# Patient Record
Sex: Female | Born: 1940
Health system: Southern US, Community
[De-identification: ages and names within clinical notes are randomized; demographics above are authoritative.]

## PROBLEM LIST (undated history)

## (undated) DIAGNOSIS — Z6833 Body mass index (BMI) 33.0-33.9, adult: Secondary | ICD-10-CM

## (undated) DIAGNOSIS — I10 Essential (primary) hypertension: Secondary | ICD-10-CM

## (undated) DIAGNOSIS — R29810 Facial weakness: Secondary | ICD-10-CM

## (undated) DIAGNOSIS — M159 Polyosteoarthritis, unspecified: Secondary | ICD-10-CM

## (undated) DIAGNOSIS — K5790 Diverticulosis of intestine, part unspecified, without perforation or abscess without bleeding: Secondary | ICD-10-CM

## (undated) DIAGNOSIS — M4722 Other spondylosis with radiculopathy, cervical region: Secondary | ICD-10-CM

## (undated) DIAGNOSIS — M1711 Unilateral primary osteoarthritis, right knee: Secondary | ICD-10-CM

## (undated) HISTORY — DX: Body mass index (BMI) 33.0-33.9, adult: Z68.33

## (undated) HISTORY — DX: Polyosteoarthritis, unspecified: M15.9

## (undated) HISTORY — DX: Other spondylosis with radiculopathy, cervical region: M47.22

## (undated) HISTORY — DX: Unilateral primary osteoarthritis, right knee: M17.11

## (undated) HISTORY — DX: Diverticulosis of intestine, part unspecified, without perforation or abscess without bleeding: K57.90

## (undated) NOTE — *Deleted (*Deleted)
Subjective:   ***  Objective:  Vital signs in last 24 hours: Vitals:   06/20/20 1553 06/20/20 2052 06/20/20 2313 06/21/20 0326  BP: (!) 144/72 (!) 152/74 (!) 165/74 (!) 160/78  Pulse: 84 91 94 89  Resp: 20 19 17 18   Temp: 98.3 F (36.8 C) 98.4 F (36.9 C) 98 F (36.7 C) 98.2 F (36.8 C)  TempSrc: Oral Oral  Oral  SpO2: 95% 96%  96%  Weight:      Height:         moderate abdominal distention, although abdomen is soft and non-tender to  a reducible, non-tender, non-erythematous umbilical hernia.   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 Neurological: Facial droop is present along areas innervated by V1-V3 on the left. Strength is 5/5 in all four extremities. There is myoclonus of the lower extremities and left upper extremity. No significant asterixes is present. There is decreased sensation along the right lower extremity. Sensation intact in other extremities. Patient is alert and answers questions appropriately, although she appears restless and slightly agitated.  Skin: There are scabbed vesicles on an erythematous base extending across the lateral left breast into the left axillary region of the left upper extremity. No other evidence of lesions or masses.  Psych: Patient appears restless and slightly agitated / anxious.   Assessment/Plan:  Principal Problem:   Encephalopathy Active Problems:   Hyponatremia  # Acute Encephalopathy Patient developed seizure-like symptoms acutely the morning of 06/19/20 following severe headache and abdominal pain, in the setting of acute shingles infection. Husband endorses decreased appetite x 6 days with nausea, vomiting, chills, and decreased PO intake. Differential is wide and includes seizure/post-ictal state, VZV encephalitis, other infectious cause, delirium in setting of possible UTI vs. Pyelonephritis, diverticulitis, hyponatremia-induced vs. medication induced (on high dose gabapentin at home). Husband states that at baseline  she is oriented and independent in ADL's. CT head without contrast showed small vessel disease and an equivocal asymmetric increased density of the Right MCA, but no acute cortically based infarct or acute intracranial hemorrhage. MRI brain without contrast showed chronic lacunar infarcts vs. perivascular spaces in the bilateral BG and chronic lacunar infarct of the right caudate with tiny infarcts of bilateral cerebellar hemispheres, but no acute intracranial abnormality. EEG showed cortical dysfunction of the R temporal region and moderate diffuse encephalopathy possibly consistent with post-ictal state; however, no seizures or epileptiform discharge were noted. 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 Patient is on HCTZ 25mg  and Losartan 100mg  daily at home. Patient appears hypervolemic with 1+ bilateral pitting edema and JVD. Creatinine and BUN uptrending. Likely renal component, including SIADH and/or RAAS abnormality / medication effect. Also consider cirrhosis / CHF. No ECHO on file although CT abdomen/pelvis showed possible cirrhosis, mild hepatic steatosis.  - Patient passed bedside swallow evaluation  - Will start on carb-modified diet with fluid restriction, daily  - Will avoid IVF  - Monitor BMP q6 hours for sodium correction, goal < 10 point correction per 24 hours   # Active Varicella Zoster Infection Patient's husband states patient was diagnosed  with shingles in May of this year, which resolved, but  developed recurrence along her left breast/axillary region x 3 weeks. Patient had been in severe pain but had stopped taking her home Valtrex with 4 remaining doses prior to admission. May be contributing factor to encephalitis. - Patient started on empirical Acyclovir until LP able to be obtained - Patient is s/p many unsuccessful LP attempts due to agitation / movement  - Monitor for improvement in rash and mental status   # Likely Pyelonephritis  U/A showed cloudy urine with large leukocytes, negative nitrites, small hemoglobinuria, no bacteria, >50 WBC, 11-20 RBC and mucus. Triple phosphate crystals present. CT abdomen/pelvis without contrast showed perinephric stranding but no nephrolithiasis. Bladder distended with air-fluid level. Patient afebrile, initial WBC 18.1. Denies associated symptoms at this time.  - Urine culture and sensitivities pending  - Started empiric treatment with Augmentin 875mg  q12 hours x 10-14 days  - Will check bladder scan q4 hours with I&O cath if >300cc's    # Diverticulosis with possible acute Diverticulitis  Patient has had nausea, vomiting, decreased PO intake, and intermittent severe abdominal pain per patient's husband with benign physical exam aside from moderate abdominal distention. Patient afebrile. CT abdomen pelvis showed colonic diverticulosis with mild pericolonic fat stranding involving the junction of the descending and sigmoid colon in the region of multiple diverticula, suspicious for acute diverticulitis without perforation or abscess.  - Started on Augmentin 875mg  q12 hrs  - Continue to monitor daily CBC, vitals   # Hepatomegaly with Borderline Hepatic Steatosis and Possible Cirrhosis  Husband notes no alcohol history, ETOH < 10, urine tox negative. HIV negative, no known history of cirrhosis. Likely due to NASH given HTN, pre-diabetes, patient is overweight. Albumin 3.1, no thrombocytopenia. - Will check HCV Antibodies  - Will check PT/INR   #  Mild Splenomegaly Platelets 271 without thrombocytopenia. WBC 18.1 with neutrophil predominance, though monocyte count 3.1. Unclear etiology. - No follow up at this time   # History of HTN Patient takes HCTZ 25mg , Losartan 100mg , and Metoprolol succinate 100mg  daily. It is unclear if patient has been taking her amlodipine 5mg  daily at home. Initial pressure 166/79, although significantly improved without home medications in ED. Vitals today mildly elevated.  - Will hold home antihypertensives for now although will likely start on amlodipine if pressures elevated tomorrow  - Patient may benefit outpatient from lisinopril if microalbuminuria continues   # Pre-Diabetes Hgb A1c 6.1 this admission. Patient not on any glucose lowering agents with no known hx DM.  - Sugars may be elevated in recent inflammation  - Continue to monitor daily BG    Encephalopathy may be due to varicella infection vs. Sedation on gabapentin at home, 2/2 hyperammonemia in setting of abdominal ascites, hypothyroidism vs. Other toxic-metabolic derangement, anoxic/hypoxic brain injury. Less likely due to hyponatremia. ETOH negative.   Prior to Admission Living Arrangement: Home Anticipated Discharge Location: Home vs. SNF Barriers to Discharge: Acute encephalopathy, possible infectious etiologies   Chevis Pretty, MD 06/21/2020, 6:13 AM Pager: [phone] After 5pm on weekdays and 1pm on weekends: On Call pager [phone]

---

## 1997-01-24 HISTORY — PX: CHOLECYSTECTOMY: SHX55

## 1997-02-24 HISTORY — PX: OTHER SURGICAL HISTORY: SHX169

## 2001-09-26 HISTORY — PX: CATARACT EXTRACTION: SUR2

## 2016-06-09 DIAGNOSIS — Z4889 Encounter for other specified surgical aftercare: Secondary | ICD-10-CM

## 2016-06-09 HISTORY — DX: Encounter for other specified surgical aftercare: Z48.89

## 2019-09-27 DIAGNOSIS — I358 Other nonrheumatic aortic valve disorders: Secondary | ICD-10-CM

## 2019-09-27 HISTORY — DX: Other nonrheumatic aortic valve disorders: I35.8

## 2019-11-25 DIAGNOSIS — G518 Other disorders of facial nerve: Secondary | ICD-10-CM | POA: Diagnosis not present

## 2019-12-24 DIAGNOSIS — D0439 Carcinoma in situ of skin of other parts of face: Secondary | ICD-10-CM | POA: Diagnosis not present

## 2019-12-24 DIAGNOSIS — L570 Actinic keratosis: Secondary | ICD-10-CM | POA: Diagnosis not present

## 2019-12-24 DIAGNOSIS — L814 Other melanin hyperpigmentation: Secondary | ICD-10-CM | POA: Diagnosis not present

## 2019-12-24 DIAGNOSIS — L821 Other seborrheic keratosis: Secondary | ICD-10-CM | POA: Diagnosis not present

## 2019-12-30 DIAGNOSIS — M25512 Pain in left shoulder: Secondary | ICD-10-CM | POA: Diagnosis not present

## 2019-12-30 DIAGNOSIS — S4992XA Unspecified injury of left shoulder and upper arm, initial encounter: Secondary | ICD-10-CM | POA: Diagnosis not present

## 2019-12-30 DIAGNOSIS — R0789 Other chest pain: Secondary | ICD-10-CM | POA: Diagnosis not present

## 2019-12-30 DIAGNOSIS — S299XXA Unspecified injury of thorax, initial encounter: Secondary | ICD-10-CM | POA: Diagnosis not present

## 2020-01-06 DIAGNOSIS — R21 Rash and other nonspecific skin eruption: Secondary | ICD-10-CM | POA: Diagnosis not present

## 2020-01-06 DIAGNOSIS — R079 Chest pain, unspecified: Secondary | ICD-10-CM | POA: Diagnosis not present

## 2020-01-06 DIAGNOSIS — R0789 Other chest pain: Secondary | ICD-10-CM | POA: Diagnosis not present

## 2020-02-06 DIAGNOSIS — B029 Zoster without complications: Secondary | ICD-10-CM | POA: Diagnosis not present

## 2020-02-13 DIAGNOSIS — I10 Essential (primary) hypertension: Secondary | ICD-10-CM | POA: Diagnosis not present

## 2020-02-13 DIAGNOSIS — B029 Zoster without complications: Secondary | ICD-10-CM | POA: Diagnosis not present

## 2020-02-27 DIAGNOSIS — I10 Essential (primary) hypertension: Secondary | ICD-10-CM | POA: Diagnosis not present

## 2020-03-09 DIAGNOSIS — G518 Other disorders of facial nerve: Secondary | ICD-10-CM | POA: Diagnosis not present

## 2020-03-09 DIAGNOSIS — I889 Nonspecific lymphadenitis, unspecified: Secondary | ICD-10-CM | POA: Diagnosis not present

## 2020-04-24 DIAGNOSIS — G5600 Carpal tunnel syndrome, unspecified upper limb: Secondary | ICD-10-CM | POA: Diagnosis not present

## 2020-04-24 DIAGNOSIS — G5601 Carpal tunnel syndrome, right upper limb: Secondary | ICD-10-CM | POA: Diagnosis not present

## 2020-04-28 DIAGNOSIS — R600 Localized edema: Secondary | ICD-10-CM | POA: Diagnosis not present

## 2020-04-28 DIAGNOSIS — E785 Hyperlipidemia, unspecified: Secondary | ICD-10-CM | POA: Diagnosis not present

## 2020-04-28 DIAGNOSIS — I10 Essential (primary) hypertension: Secondary | ICD-10-CM | POA: Diagnosis not present

## 2020-05-05 DIAGNOSIS — M7989 Other specified soft tissue disorders: Secondary | ICD-10-CM | POA: Diagnosis not present

## 2020-06-11 DIAGNOSIS — B029 Zoster without complications: Secondary | ICD-10-CM | POA: Diagnosis not present

## 2020-06-19 ENCOUNTER — Emergency Department (HOSPITAL_COMMUNITY): Payer: Medicare PPO

## 2020-06-19 ENCOUNTER — Inpatient Hospital Stay (HOSPITAL_COMMUNITY): Payer: Medicare PPO

## 2020-06-19 ENCOUNTER — Inpatient Hospital Stay (HOSPITAL_COMMUNITY)
Admission: EM | Admit: 2020-06-19 | Discharge: 2020-07-01 | DRG: 073 | Disposition: A | Discharge status: Home Health | Payer: Medicare PPO | Attending: Internal Medicine | Admitting: Internal Medicine

## 2020-06-19 ENCOUNTER — Encounter (HOSPITAL_COMMUNITY): Payer: Self-pay | Admitting: Emergency Medicine

## 2020-06-19 DIAGNOSIS — G928 Other toxic encephalopathy: Secondary | ICD-10-CM | POA: Diagnosis present

## 2020-06-19 DIAGNOSIS — Z91041 Radiographic dye allergy status: Secondary | ICD-10-CM

## 2020-06-19 DIAGNOSIS — R29818 Other symptoms and signs involving the nervous system: Secondary | ICD-10-CM | POA: Diagnosis not present

## 2020-06-19 DIAGNOSIS — G4489 Other headache syndrome: Secondary | ICD-10-CM | POA: Diagnosis not present

## 2020-06-19 DIAGNOSIS — E878 Other disorders of electrolyte and fluid balance, not elsewhere classified: Secondary | ICD-10-CM | POA: Diagnosis present

## 2020-06-19 DIAGNOSIS — R270 Ataxia, unspecified: Secondary | ICD-10-CM | POA: Diagnosis present

## 2020-06-19 DIAGNOSIS — I480 Paroxysmal atrial fibrillation: Secondary | ICD-10-CM | POA: Diagnosis not present

## 2020-06-19 DIAGNOSIS — G8194 Hemiplegia, unspecified affecting left nondominant side: Secondary | ICD-10-CM | POA: Diagnosis present

## 2020-06-19 DIAGNOSIS — J90 Pleural effusion, not elsewhere classified: Secondary | ICD-10-CM | POA: Diagnosis not present

## 2020-06-19 DIAGNOSIS — B020 Zoster encephalitis: Secondary | ICD-10-CM | POA: Diagnosis not present

## 2020-06-19 DIAGNOSIS — R160 Hepatomegaly, not elsewhere classified: Secondary | ICD-10-CM | POA: Diagnosis not present

## 2020-06-19 DIAGNOSIS — E663 Overweight: Secondary | ICD-10-CM | POA: Diagnosis present

## 2020-06-19 DIAGNOSIS — I313 Pericardial effusion (noninflammatory): Secondary | ICD-10-CM | POA: Diagnosis present

## 2020-06-19 DIAGNOSIS — N289 Disorder of kidney and ureter, unspecified: Secondary | ICD-10-CM | POA: Diagnosis not present

## 2020-06-19 DIAGNOSIS — Z882 Allergy status to sulfonamides status: Secondary | ICD-10-CM

## 2020-06-19 DIAGNOSIS — I5031 Acute diastolic (congestive) heart failure: Secondary | ICD-10-CM | POA: Diagnosis present

## 2020-06-19 DIAGNOSIS — Z6829 Body mass index (BMI) 29.0-29.9, adult: Secondary | ICD-10-CM

## 2020-06-19 DIAGNOSIS — Z23 Encounter for immunization: Secondary | ICD-10-CM | POA: Diagnosis not present

## 2020-06-19 DIAGNOSIS — I309 Acute pericarditis, unspecified: Secondary | ICD-10-CM | POA: Diagnosis not present

## 2020-06-19 DIAGNOSIS — R161 Splenomegaly, not elsewhere classified: Secondary | ICD-10-CM | POA: Diagnosis present

## 2020-06-19 DIAGNOSIS — K429 Umbilical hernia without obstruction or gangrene: Secondary | ICD-10-CM | POA: Diagnosis not present

## 2020-06-19 DIAGNOSIS — N10 Acute tubulo-interstitial nephritis: Secondary | ICD-10-CM | POA: Diagnosis not present

## 2020-06-19 DIAGNOSIS — R404 Transient alteration of awareness: Secondary | ICD-10-CM | POA: Diagnosis not present

## 2020-06-19 DIAGNOSIS — B028 Zoster with other complications: Secondary | ICD-10-CM | POA: Diagnosis not present

## 2020-06-19 DIAGNOSIS — R339 Retention of urine, unspecified: Secondary | ICD-10-CM | POA: Diagnosis not present

## 2020-06-19 DIAGNOSIS — Z8673 Personal history of transient ischemic attack (TIA), and cerebral infarction without residual deficits: Secondary | ICD-10-CM

## 2020-06-19 DIAGNOSIS — N179 Acute kidney failure, unspecified: Secondary | ICD-10-CM | POA: Diagnosis not present

## 2020-06-19 DIAGNOSIS — R079 Chest pain, unspecified: Secondary | ICD-10-CM | POA: Diagnosis not present

## 2020-06-19 DIAGNOSIS — G0490 Encephalitis and encephalomyelitis, unspecified: Secondary | ICD-10-CM | POA: Diagnosis not present

## 2020-06-19 DIAGNOSIS — R531 Weakness: Secondary | ICD-10-CM | POA: Diagnosis not present

## 2020-06-19 DIAGNOSIS — T375X6A Underdosing of antiviral drugs, initial encounter: Secondary | ICD-10-CM | POA: Diagnosis present

## 2020-06-19 DIAGNOSIS — K7460 Unspecified cirrhosis of liver: Secondary | ICD-10-CM | POA: Diagnosis not present

## 2020-06-19 DIAGNOSIS — E7220 Disorder of urea cycle metabolism, unspecified: Secondary | ICD-10-CM | POA: Diagnosis not present

## 2020-06-19 DIAGNOSIS — K5730 Diverticulosis of large intestine without perforation or abscess without bleeding: Secondary | ICD-10-CM | POA: Diagnosis present

## 2020-06-19 DIAGNOSIS — I4891 Unspecified atrial fibrillation: Secondary | ICD-10-CM | POA: Diagnosis not present

## 2020-06-19 DIAGNOSIS — N390 Urinary tract infection, site not specified: Secondary | ICD-10-CM | POA: Diagnosis present

## 2020-06-19 DIAGNOSIS — B964 Proteus (mirabilis) (morganii) as the cause of diseases classified elsewhere: Secondary | ICD-10-CM | POA: Diagnosis not present

## 2020-06-19 DIAGNOSIS — Z20822 Contact with and (suspected) exposure to covid-19: Secondary | ICD-10-CM | POA: Diagnosis not present

## 2020-06-19 DIAGNOSIS — B961 Klebsiella pneumoniae [K. pneumoniae] as the cause of diseases classified elsewhere: Secondary | ICD-10-CM | POA: Diagnosis not present

## 2020-06-19 DIAGNOSIS — B029 Zoster without complications: Secondary | ICD-10-CM

## 2020-06-19 DIAGNOSIS — G9340 Encephalopathy, unspecified: Secondary | ICD-10-CM | POA: Diagnosis not present

## 2020-06-19 DIAGNOSIS — G92 Toxic encephalopathy: Secondary | ICD-10-CM | POA: Diagnosis not present

## 2020-06-19 DIAGNOSIS — Z885 Allergy status to narcotic agent status: Secondary | ICD-10-CM

## 2020-06-19 DIAGNOSIS — I361 Nonrheumatic tricuspid (valve) insufficiency: Secondary | ICD-10-CM | POA: Diagnosis not present

## 2020-06-19 DIAGNOSIS — I110 Hypertensive heart disease with heart failure: Secondary | ICD-10-CM | POA: Diagnosis not present

## 2020-06-19 DIAGNOSIS — E871 Hypo-osmolality and hyponatremia: Secondary | ICD-10-CM | POA: Diagnosis not present

## 2020-06-19 DIAGNOSIS — I739 Peripheral vascular disease, unspecified: Secondary | ICD-10-CM | POA: Diagnosis present

## 2020-06-19 DIAGNOSIS — Z79899 Other long term (current) drug therapy: Secondary | ICD-10-CM

## 2020-06-19 DIAGNOSIS — G971 Other reaction to spinal and lumbar puncture: Secondary | ICD-10-CM | POA: Diagnosis not present

## 2020-06-19 DIAGNOSIS — R569 Unspecified convulsions: Secondary | ICD-10-CM | POA: Diagnosis present

## 2020-06-19 DIAGNOSIS — R4182 Altered mental status, unspecified: Secondary | ICD-10-CM | POA: Diagnosis not present

## 2020-06-19 DIAGNOSIS — R410 Disorientation, unspecified: Secondary | ICD-10-CM | POA: Diagnosis not present

## 2020-06-19 DIAGNOSIS — G510 Bell's palsy: Secondary | ICD-10-CM | POA: Diagnosis present

## 2020-06-19 DIAGNOSIS — I301 Infective pericarditis: Secondary | ICD-10-CM | POA: Diagnosis not present

## 2020-06-19 DIAGNOSIS — R7303 Prediabetes: Secondary | ICD-10-CM | POA: Diagnosis present

## 2020-06-19 DIAGNOSIS — I440 Atrioventricular block, first degree: Secondary | ICD-10-CM | POA: Diagnosis present

## 2020-06-19 DIAGNOSIS — B3323 Viral pericarditis: Secondary | ICD-10-CM | POA: Diagnosis not present

## 2020-06-19 DIAGNOSIS — I10 Essential (primary) hypertension: Secondary | ICD-10-CM | POA: Diagnosis not present

## 2020-06-19 DIAGNOSIS — Z888 Allergy status to other drugs, medicaments and biological substances status: Secondary | ICD-10-CM

## 2020-06-19 DIAGNOSIS — R52 Pain, unspecified: Secondary | ICD-10-CM | POA: Diagnosis not present

## 2020-06-19 DIAGNOSIS — K760 Fatty (change of) liver, not elsewhere classified: Secondary | ICD-10-CM | POA: Diagnosis not present

## 2020-06-19 DIAGNOSIS — K449 Diaphragmatic hernia without obstruction or gangrene: Secondary | ICD-10-CM | POA: Diagnosis not present

## 2020-06-19 DIAGNOSIS — R29810 Facial weakness: Secondary | ICD-10-CM | POA: Diagnosis not present

## 2020-06-19 DIAGNOSIS — N39490 Overflow incontinence: Secondary | ICD-10-CM | POA: Diagnosis present

## 2020-06-19 DIAGNOSIS — B9789 Other viral agents as the cause of diseases classified elsewhere: Secondary | ICD-10-CM | POA: Diagnosis not present

## 2020-06-19 DIAGNOSIS — I249 Acute ischemic heart disease, unspecified: Secondary | ICD-10-CM

## 2020-06-19 DIAGNOSIS — J9811 Atelectasis: Secondary | ICD-10-CM | POA: Diagnosis not present

## 2020-06-19 HISTORY — DX: Encephalopathy, unspecified: G93.40

## 2020-06-19 HISTORY — DX: Facial weakness: R29.810

## 2020-06-19 HISTORY — DX: Essential (primary) hypertension: I10

## 2020-06-19 LAB — RESPIRATORY PANEL BY RT PCR (FLU A&B, COVID)
Influenza A by PCR: NEGATIVE
Influenza B by PCR: NEGATIVE
SARS Coronavirus 2 by RT PCR: NEGATIVE

## 2020-06-19 LAB — BASIC METABOLIC PANEL
Anion gap: 10 (ref 5–15)
BUN: 17 mg/dL (ref 8–23)
CO2: 27 mmol/L (ref 22–32)
Calcium: 8.3 mg/dL — ABNORMAL LOW (ref 8.9–10.3)
Chloride: 86 mmol/L — ABNORMAL LOW (ref 98–111)
Creatinine, Ser: 0.95 mg/dL (ref 0.44–1.00)
GFR calc Af Amer: >60 mL/min (ref >60)
GFR calc non Af Amer: 56 mL/min — ABNORMAL LOW (ref >60)
Glucose, Bld: 110 mg/dL — ABNORMAL HIGH (ref 70–99)
Potassium: 3.8 mmol/L (ref 3.5–5.1)
Sodium: 123 mmol/L — ABNORMAL LOW (ref 135–145)

## 2020-06-19 LAB — COMPREHENSIVE METABOLIC PANEL
ALT: 18 U/L (ref 0–44)
AST: 18 U/L (ref 15–41)
Albumin: 3.1 g/dL — ABNORMAL LOW (ref 3.5–5.0)
Alkaline Phosphatase: 84 U/L (ref 38–126)
Anion gap: 11 (ref 5–15)
BUN: 14 mg/dL (ref 8–23)
CO2: 26 mmol/L (ref 22–32)
Calcium: 8.9 mg/dL (ref 8.9–10.3)
Chloride: 86 mmol/L — ABNORMAL LOW (ref 98–111)
Creatinine, Ser: 0.72 mg/dL (ref 0.44–1.00)
GFR calc Af Amer: >60 mL/min (ref >60)
GFR calc non Af Amer: >60 mL/min (ref >60)
Glucose, Bld: 125 mg/dL — ABNORMAL HIGH (ref 70–99)
Potassium: 4.1 mmol/L (ref 3.5–5.1)
Sodium: 123 mmol/L — ABNORMAL LOW (ref 135–145)
Total Bilirubin: 0.7 mg/dL (ref 0.3–1.2)
Total Protein: 6.4 g/dL — ABNORMAL LOW (ref 6.5–8.1)

## 2020-06-19 LAB — CBC
HCT: 40 % (ref 36.0–46.0)
Hemoglobin: 13.3 g/dL (ref 12.0–15.0)
MCH: 27 pg (ref 26.0–34.0)
MCHC: 33.3 g/dL (ref 30.0–36.0)
MCV: 81.3 fL (ref 80.0–100.0)
Platelets: 271 10*3/uL (ref 150–400)
RBC: 4.92 MIL/uL (ref 3.87–5.11)
RDW: 13.4 % (ref 11.5–15.5)
WBC: 18.1 10*3/uL — ABNORMAL HIGH (ref 4.0–10.5)
nRBC: 0 % (ref 0.0–0.2)

## 2020-06-19 LAB — DIFFERENTIAL
Abs Immature Granulocytes: 1.01 10*3/uL — ABNORMAL HIGH (ref 0.00–0.07)
Basophils Absolute: 0.1 10*3/uL (ref 0.0–0.1)
Basophils Relative: 0 %
Eosinophils Absolute: 0.1 10*3/uL (ref 0.0–0.5)
Eosinophils Relative: 0 %
Immature Granulocytes: 6 %
Lymphocytes Relative: 11 %
Lymphs Abs: 2.1 10*3/uL (ref 0.7–4.0)
Monocytes Absolute: 3.1 10*3/uL — ABNORMAL HIGH (ref 0.1–1.0)
Monocytes Relative: 17 %
Neutro Abs: 11.9 10*3/uL — ABNORMAL HIGH (ref 1.7–7.7)
Neutrophils Relative %: 66 %

## 2020-06-19 LAB — PROTIME-INR
INR: 1.1 (ref 0.8–1.2)
Prothrombin Time: 13.5 seconds (ref 11.4–15.2)

## 2020-06-19 LAB — HIV ANTIBODY (ROUTINE TESTING W REFLEX): HIV Screen 4th Generation wRfx: NONREACTIVE

## 2020-06-19 LAB — AMMONIA: Ammonia: 19 umol/L (ref 9–35)

## 2020-06-19 LAB — APTT: aPTT: 27 seconds (ref 24–36)

## 2020-06-19 LAB — ETHANOL: Alcohol, Ethyl (B): <10 mg/dL (ref <10)

## 2020-06-19 MED ORDER — ONDANSETRON HCL 4 MG PO TABS: 4.0000 mg | ORAL_TABLET | Freq: Four times a day (QID) | ORAL | Status: DC | PRN

## 2020-06-19 MED ORDER — ACETAMINOPHEN 325 MG PO TABS
650.0000 mg | ORAL_TABLET | Freq: Four times a day (QID) | ORAL | Status: DC | PRN
  Administered 2020-06-28 – 2020-06-29 (×2): 650 mg via ORAL
  Filled 2020-06-19 (×2): qty 2

## 2020-06-19 MED ORDER — LEVETIRACETAM 500 MG PO TABS
500.0000 mg | ORAL_TABLET | Freq: Two times a day (BID) | ORAL | Status: DC
  Filled 2020-06-19: qty 1

## 2020-06-19 MED ORDER — ACETAMINOPHEN 650 MG RE SUPP: 650.0000 mg | Freq: Four times a day (QID) | RECTAL | Status: DC | PRN

## 2020-06-19 MED ORDER — DEXTROSE 5 % IV SOLN
800.0000 mg | Freq: Three times a day (TID) | INTRAVENOUS | Status: DC
  Administered 2020-06-19 – 2020-06-20 (×2): 800 mg via INTRAVENOUS
  Filled 2020-06-19 (×9): qty 16

## 2020-06-19 MED ORDER — ONDANSETRON HCL 4 MG/2ML IJ SOLN: 4.0000 mg | Freq: Four times a day (QID) | INTRAMUSCULAR | Status: DC | PRN

## 2020-06-19 MED ORDER — LORAZEPAM 2 MG/ML IJ SOLN
1.0000 mg | Freq: Once | INTRAMUSCULAR | Status: AC
  Administered 2020-06-19: 1 mg via INTRAVENOUS
  Filled 2020-06-19: qty 1

## 2020-06-19 MED ORDER — LEVETIRACETAM IN NACL 1500 MG/100ML IV SOLN
1500.0000 mg | Freq: Once | INTRAVENOUS | Status: AC
  Administered 2020-06-19: 1500 mg via INTRAVENOUS
  Filled 2020-06-19: qty 100

## 2020-06-19 MED ORDER — LEVETIRACETAM IN NACL 500 MG/100ML IV SOLN
500.0000 mg | Freq: Two times a day (BID) | INTRAVENOUS | Status: DC
  Administered 2020-06-19 – 2020-07-01 (×24): 500 mg via INTRAVENOUS
  Filled 2020-06-19 (×25): qty 100

## 2020-06-19 MED ORDER — GADOBUTROL 1 MMOL/ML IV SOLN
6.0000 mL | Freq: Once | INTRAVENOUS | Status: AC | PRN
  Administered 2020-06-19: 6 mL via INTRAVENOUS

## 2020-06-19 MED ORDER — LIDOCAINE HCL (PF) 1 % IJ SOLN: 5.0000 mL | Freq: Once | INTRAMUSCULAR | Status: DC

## 2020-06-19 MED ORDER — ENOXAPARIN SODIUM 40 MG/0.4ML ~~LOC~~ SOLN
40.0000 mg | SUBCUTANEOUS | Status: DC
  Administered 2020-06-19 – 2020-06-20 (×2): 40 mg via SUBCUTANEOUS
  Filled 2020-06-19 (×2): qty 0.4

## 2020-06-19 MED ORDER — SODIUM CHLORIDE 0.9 % IV SOLN
INTRAVENOUS | Status: DC | PRN
  Administered 2020-06-19 – 2020-06-24 (×4): 1000 mL via INTRAVENOUS
  Administered 2020-06-25 – 2020-06-26 (×2): 250 mL via INTRAVENOUS
  Administered 2020-06-28: 500 mL via INTRAVENOUS
  Administered 2020-06-29: 250 mL via INTRAVENOUS

## 2020-06-19 NOTE — Hospital Course (Addendum)
Alyssa Hudson is an 79 year old woman with a past medical history of recent shingles and carpal tunnel syndrome who was admitted to the hospital on 06/19/2020 due to altered mental status secondary to viral encephalitis and pyelonephritis.  Acute Encephalopathy  Patient developed seizure-like symptoms acutely the morning of 06/19/20 following severe headache and abdominal pain, in the setting of acute shingles infection. CT head without contrast showed small vessel disease and an equivocal asymmetric increased density of the Right MCA, without acute disease process. MRI brain without contrast showed chronic lacunar infarcts vs. perivascular spaces in the bilateral BG and chronic lacunar infarct of the right caudate with tiny infarcts of bilateral cerebellar hemispheres, but no acute intracranial abnormality. EEG showed cortical dysfunction of the R temporal region and moderate diffuse encephalopathy possibly consistent with post-ictal state. Differential included VZV encephalitis versus metabolic encephalopathy 2/2 urinary infection. She was treated empirically with acyclovir for 12 days (10 days IV acyclovir + 2 days PO valacyclovir) to cover VZV, doxycycline for 7 days for to cover for possible tickborne illness. Her mentation continued to improve throughout her hospital stay and she returned to her baseline mentation by hospital day 9.  Active Varicella Zoster Infection Patient was diagnosed with shingles in May 2021, which resolved, but developed recurrence along her left breast/axillary region 3 weeks prior to admission. Patient had been in severe pain but had stopped taking her home Valtrex with 4 remaining doses prior to admission. She completed a 12-day course of acyclovir/valacyclovir during hospitalization. - Follow-up with PCP for consideration of prophylactic Valtrex   Atrial fibrillation in the setting of pericarditis Patient without history of arrhythmia. Toward the end of her hospital stay  she started showing bigeminy, PVCs, and going in and out of atrial fibrillation, despite being overall asymptomatic. One episode of atrial fibrillation was captured on EKG, subsequent echo showed normal EF with a small pericardial effusion. She was started on colchicine 0.6 mg twice daily and ibuprofen 600 mg 3 times daily. She will continue the colchicine for 6 months and the ibuprofen for 2 weeks. She is rate controlled with Toprol-XL 100 mg daily. Cardiology decided to hold off on anticoagulation due to the atrial fibrillation being in the setting of acute pericarditis. She will follow up with cardiology outpatient for management of this. - Colchicine 0.6 mg twice daily for 6 months (end ~April 2022) - Ibuprofen 600 mg 3 times daily for 2 weeks (end date: ~07/14/20)  Hypomagnesemia Patient was found to have asymptomatic hypomagnesemia with levels trending down to 0.9 and leveling off around 1.7. Her magnesium was repleted multiple times while in the hospital. She will follow up on this with her PCP. - Repeat Mg at hospital follow-up  Urinary retention While in the hospital she described a prior history consistent with a mixed picture of urge and overflow incontinence. While in the hospital she developed overflow incontinence requiring I/O cath multiple times per day. Foley catheter was removed on day of discharge and she was able to urinate without problem.   UTI with possible pyelonephritis On admission she had a urinalysis and CT abdomen/pelvis just of pyelonephritis. Due to initial altered mental status, symptoms were unable to be elicited. Her urine culture grew Klebsiella Pneumoniae and Proteus Vulgaris, and she was treated with 7 days of appropriate antibiotics.   Hypotonic, Hypochloremic Hyponatremia  AKI Initial sodium 123, chloride 86, Serum osms 259, Urine osms and urine sodium inappropriately elevated at 505 and 75, respectively. Patient is on HCTZ 25mg  and Losartan 100mg   daily at home.  This was likely due to a combined picture of SIADH and home medications HCTZ and Losartan. Patient also developed an AKI 3 days into starting acyclovir, due to the need for continuing acyclovir for her encephalitis, she was started on LR for maintenance fluids. Her AKI subsequently resolved, BUN/creatinine normalized on discharge.  - Follow-up BMP in 1 to 2 weeks with her PCP.  - Discontinued HCTZ on discharge, continue losartan 100 mg daily.  Diverticulosis with possible acute Diverticulitis  Per patient's husband patient has had nausea, vomiting, decreased PO intake, and intermittent severe abdominal pain prior to coming into the hospital. Physical exam was unremarkable despite some abdominal distention. CT abdomen pelvis showed colonic diverticulosis with mild pericolonic fat stranding involving the junction of the descending and sigmoid colon in the region of multiple diverticula, suspicious for acute diverticulitis without perforation or abscess. She completed an appropriate course of Flagyl.   HTN Patient had a history of hypertension and her home medications included HCTZ 25mg , Losartan 100mg , and Metoprolol succinate 100mg  daily. Due to hyponatremia the HCTZ and losartan were initially held. She continued her Norvasc and metoprolol while in the hospital. Continue her Norvasc, losartan, metoprolol outpatient.  - Follow-up with PCP for medication management. Discharged on Norvasc 10 mg daily, Losartan 100 mg daily, Toprol 100 mg daily

## 2020-06-19 NOTE — Progress Notes (Signed)
Pt not available for EEG  At IR for Lumbar puncture per nurse

## 2020-06-19 NOTE — Code Documentation (Signed)
Pt is a 79 yr old female BIB [facility]. Code stroke paged out at 1107. At that time, sx were "headache, and unresponsiveness". When pt arrived at 1127, she was awake, MAE and talking.EMS reported that pt had left sided weakness en route to the hospital but it resolved. Pt was taken to CT at 1130. Pt has a history of hypertension and recent shingles. Code stroke cancelled at 1137 per Dr Derry Lory. Pt taken to room 11. Pt has an NIHSS of 8 for drowsiness, rt gaze, left droop, bilateral drift of legs and ataxia of left arm. Pt's husband arrived and provided more history. He stated she has not been well for several days, and that she had a brief seizure this morning. He also stated that he gave the patient "chest compressions". Pt transported to STAT MRI and handoff with Chestine Spore RN complete. Pt not eligible for TPA due to outside treatment window, and ineligible for IR as clinical exam LVO negative.

## 2020-06-19 NOTE — ED Notes (Signed)
Alyssa Hudson, son, [phone] would like an update when available

## 2020-06-19 NOTE — Consult Note (Addendum)
NEUROLOGY CONSULTATION NOTE   Date of service: June 19, 2020 Patient Name: Alyssa Hudson MRN:  [id number] DOB:  February 27, 1941 Reason for consult: "stroke code"  History of Present Illness  Alyssa Hudson is a 79 y.o. female with PMH significant for HTN, recent shingles, known L facial droop per husband for which she gets botox every 3 months who presents as a stroke code for headache, AMS and resolving left sided weakness.  Per husband, patient has been undergoing treatment for shingles for about a month now. Over the last couple of days, she has had poor PO intake coupled with nausea and vomiting at night. Today in AM, she reported really bad headache. She does not typically have a headache. She seemed lethargic all morning but still ate some breakfast. She seemed withdrawn. She sat down on the recliner and had a bout 15 secs of witnessed seizure involving all arms and legs. After the episode, she was unresponsive and husband pulled her off the recliner and on the ground to do CPR. He called EMS. EMS noted patient was unresponsive and had weakness on the left with left facial droop. The weakness lasted about 40 mins and was resolving on the way to the ED but patient was persistently somnolent.  Low suspicion for stroke given no focal deficit on presentation and most concerning for seizure, therefore stroke alert was cancelled.   ROS   Endorses headache, unable to provide detailed ROS due to somnolence.  Past History   Past Medical History:  Diagnosis Date  . Hypertension    The histories are not reviewed yet. Please review them in the "History" navigator section and refresh this SmartLink. No family history on file. Social History   Socioeconomic History  . Marital status: Married    Spouse name: Not on file  . Number of children: Not on file  . Years of education: Not on file  . Highest education level: Not on file  Occupational History  . Not on file  Tobacco Use  .  Smoking status: Not on file  Substance and Sexual Activity  . Alcohol use: Not on file  . Drug use: Not on file  . Sexual activity: Not on file  Other Topics Concern  . Not on file  Social History Narrative  . Not on file   Social Determinants of Health   Financial Resource Strain:   . Difficulty of Paying Living Expenses: Not on file  Food Insecurity:   . Worried About Programme researcher, broadcasting/film/video in the Last Year: Not on file  . Ran Out of Food in the Last Year: Not on file  Transportation Needs:   . Lack of Transportation (Medical): Not on file  . Lack of Transportation (Non-Medical): Not on file  Physical Activity:   . Days of Exercise per Week: Not on file  . Minutes of Exercise per Session: Not on file  Stress:   . Feeling of Stress : Not on file  Social Connections:   . Frequency of Communication with Friends and Family: Not on file  . Frequency of Social Gatherings with Friends and Family: Not on file  . Attends Religious Services: Not on file  . Active Member of Clubs or Organizations: Not on file  . Attends Banker Meetings: Not on file  . Marital Status: Not on file   No Known Allergies  Medications  (Not in a hospital admission)    Vitals  Temp:  [98.2 F (36.8 C)] 98.2 F (36.8  C) (09/24 1201) Pulse Rate:  [81] 81 (09/24 1201) Resp:  [16] 16 (09/24 1201) BP: (166)/(79) 166/79 (09/24 1201) SpO2:  [94 %] 94 % (09/24 1201)  There is no height or weight on file to calculate BMI.  Physical Exam   General: Laying comfortably in bed; in no acute distress.  HENT: Normal oropharynx and mucosa. Normal external appearance of ears and nose. Neck: Supple, no pain or tenderness CV: No JVD. No peripheral edema. Pulmonary: Symmetric Chest rise. Normal respiratory effort. Abdomen: Soft to touch, non-tender Ext: No cyanosis, edema, or deformity  Skin: No rash. Normal palpation of skin.   Musculoskeletal: Normal digits and nails by inspection. No  clubbing.  Neurologic Examination  Mental status/Cognition: Somnolence, Speech/language: Non fluent, comprehension intact to simple commands. Cranial nerves:   CN II Pupils equal and reactive to light, blinks to threat on left and right.   CN III,IV,VI EOM intact, initial concern for R gaze preference but that resolved. No nystagmus   CN V normal sensation in V1, V2, and V3 segments bilaterally   CN VII Flattening of the nasolabial fold on the left. That is chronic per husband.   CN VIII normal hearing to speech   CN IX & X normal palatal elevation, no uvular deviation   CN XI    CN XII midline tongue protrusion   Motor:  Muscle bulk: normal, tone normal. Atleast a 4+ out of 5 in all extremities. She has difficulty with keeping both legs off the bed but it seems like that is symmetric and seems to be more of a problem with following commands.  Reflexes:  Right Left Comments  Pectoralis      Biceps (C5/6) 1 1   Brachioradialis (C5/6) 1 1    Triceps (C6/7) 1 1    Patellar (L3/4) 1 1    Achilles (S1)      Hoffman      Plantar     Jaw jerk    Sensation:  Light touch Intact throughout   Pin prick    Temperature    Vibration   Proprioception    Coordination/Complex Motor:  - Finger to Nose intact BL  Labs   No results found for: NA, K, CL, CO2, GLUCOSE, BUN, CREATININE, CALCIUM, ALBUMIN, AST, ALT, ALKPHOS, BILITOT, GFRNONAA, GFRAA   Imaging and Diagnostic studies  CTH without contrast: 1. Equivocal asymmetric increased density of the Right MCA, but no acute cortically based infarct or acute intracranial hemorrhage identified. ASPECTS 10. 2. Evidence of chronic small vessel disease. 3. These results were discussed Dr. Tollie Eth at 11:40hours by telephone.  Impression   Alyssa Hudson is a 79 y.o. female with PMH significant for  HTN, shingles since 4 weeks on treatment, known L facial droop per husband for which she gets botox every 3 months who presents as a  stroke code for headache, AMS and resolving left sided weakness. Neuro exam with L facial droop that is chronic, encephalopathy with difficulty following commands. A STAT CTH was obtained and was notable for ?r CMA hyperdensity but given her symptoms were resolving and contrast allergy, CTA was not obtained. STAT MRI was negative for an acute stroke, motion degraded but no abnormal contrast enhancement noted.  My suspicion is that this was likely a seizure with todd's paresis on the left and patient was probably post ictal on presentation to the ED. 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  Recommendations  - Recommend Acyclovir  until VZV Encephalitis ruled out - Keppra 1500mg  load in the ED, continue Keppra 500mg  BID - I ordered routine EEG - I ordered CSF cell count, differential x 2 tubes, protein, glucose, VZV IgM and IgG, CSF VDRL, VZV PCR in CSF, HSV PCR CSF, HSV I/2 IgM and IgG, CMV PCR, Cryptococcus Ag in CSF, CSF gram stain and culture, CSF anaerobic Cx, HIV Ab. - CBC, CMP are pending. TSH, Ammonia, B12, Folate pending. - Please order MR Angio Head without contrast and MR Angio neck without contrast tomorrow. ______________________________________________________________________  This patient is critically ill and at significant risk of neurological worsening, death and care requires constant monitoring of vital signs, hemodynamics,respiratory and cardiac monitoring, neurological assessment, discussion with family, other specialists and medical decision making of high complexity. I spent 74 minutes of neurocritical care time  in the care of  this patient. This was time spent independent of any time provided by nurse practitioner or PA.  [facility] Pager Number 06/19/2020  3:22 PM  Thank you for the opportunity to take part in the care of this patient. If you have any further questions, please contact the  neurology consultation attending.  Signed,  [phone] [facility] Pager Number 06/21/2020

## 2020-06-19 NOTE — ED Provider Notes (Addendum)
[facility] EMERGENCY DEPARTMENT Provider Note   CSN: [id number] Arrival date & time: 06/19/20  1127  An emergency department physician performed an initial assessment on this suspected stroke patient at [address]).  History Chief Complaint  Patient presents with  . Altered Mental Status    Alyssa Hudson is a 79 y.o. female.  Patient with history of hypertension, chronic left-sided facial droop who presents to the ED at as a code stroke.  Patient woke up with headache and then had seizure-like event.  Has been on acyclovir for shingles on her left chest and back.  Patient confused upon arrival but able to follow commands.  History mostly obtained from husband.  States that she has been more confused over the last several days.  Was having trouble taking the acyclovir and having some GI symptoms.Woke up with a headache and had a seizure-like event.  Thought that the left side was a little weak.  The history is provided by the patient.  Altered Mental Status Presenting symptoms: confusion and unresponsiveness   Severity:  Mild Episode history:  Single Progression:  Resolved Chronicity:  New Context: recent illness   Associated symptoms: headaches and seizures   Associated symptoms: no abdominal pain, no fever, no palpitations, no rash and no vomiting        Past Medical History:  Diagnosis Date  . Facial droop   . Hypertension     There are no problems to display for this patient.    OB History   No obstetric history on file.     No family history on file.  Social History   Tobacco Use  . Smoking status: Not on file  Substance Use Topics  . Alcohol use: Not on file  . Drug use: Not on file    Home Medications Prior to Admission medications   Medication Sig Start Date End Date Taking? Authorizing Provider  amLODipine (NORVASC) 5 MG tablet Take 5 mg by mouth daily. 05/07/20   [provider]  gabapentin (NEURONTIN) 300 MG  capsule Take 600 mg by mouth 3 (three) times daily.  06/11/20   [provider]  hydrochlorothiazide (HYDRODIURIL) 25 MG tablet Take 25 mg by mouth daily. 05/11/20   [provider]  losartan (COZAAR) 100 MG tablet Take 100 mg by mouth daily. 03/07/20   [provider]  metoprolol succinate (TOPROL-XL) 100 MG 24 hr tablet Take 100 mg by mouth daily. 05/07/20   [provider]    Allergies    Iodine-131  Review of Systems   Review of Systems  Constitutional: Negative for chills and fever.  HENT: Negative for ear pain and sore throat.   Eyes: Negative for pain and visual disturbance.  Respiratory: Negative for cough and shortness of breath.   Cardiovascular: Negative for chest pain and palpitations.  Gastrointestinal: Negative for abdominal pain and vomiting.  Genitourinary: Negative for dysuria and hematuria.  Musculoskeletal: Negative for arthralgias and back pain.  Skin: Negative for color change and rash.  Neurological: Positive for seizures and headaches. Negative for syncope.  Psychiatric/Behavioral: Positive for confusion.  All other systems reviewed and are negative.   Physical Exam Updated Vital Signs  ED Triage Vitals  Enc Vitals Group     BP 06/19/20 1201 (!) 166/79     Pulse Rate 06/19/20 1201 81     Resp 06/19/20 1201 16     Temp 06/19/20 1201 98.2 F (36.8 C)     Temp Source 06/19/20 1201  Oral     SpO2 06/19/20 1201 94 %     Weight --      Height 06/19/20 1201  (1.651 m)     Head Circumference --      Peak Flow --      Pain Score 06/19/20 1147 0     Pain Loc --      Pain Edu? --      Excl. in GC? --     Physical Exam Vitals and nursing note reviewed.  Constitutional:      General: She is not in acute distress.    Appearance: She is well-developed. She is not ill-appearing.  HENT:     Head: Normocephalic and atraumatic.     Nose: Nose normal.     Mouth/Throat:     Mouth: Mucous membranes are moist.  Eyes:      Extraocular Movements: Extraocular movements intact.     Conjunctiva/sclera: Conjunctivae normal.     Pupils: Pupils are equal, round, and reactive to light.  Cardiovascular:     Rate and Rhythm: Normal rate and regular rhythm.     Pulses: Normal pulses.     Heart sounds: Normal heart sounds. No murmur heard.   Pulmonary:     Effort: Pulmonary effort is normal. No respiratory distress.     Breath sounds: Normal breath sounds.  Abdominal:     General: Abdomen is flat.     Palpations: Abdomen is soft.     Tenderness: There is no abdominal tenderness.  Musculoskeletal:        General: Normal range of motion.     Cervical back: Normal range of motion and neck supple.  Skin:    General: Skin is warm and dry.     Capillary Refill: Capillary refill takes less than 2 seconds.     Findings: Rash (vesicular lesions to left chest and upper back) present.  Neurological:     General: No focal deficit present.     Mental Status: She is alert.     Comments: Patient overall with strength intact, sensation intact, has some neglect but I think it is likely confusion, no obvious speech problems, mild left-sided facial droop which is supposedly chronic  Psychiatric:        Mood and Affect: Mood normal.     ED Results / Procedures / Treatments   Labs (all labs ordered are listed, but only abnormal results are displayed) Labs Reviewed  CBC - Abnormal; Notable for the following components:      Result Value   WBC 18.1 (*)    All other components within normal limits  DIFFERENTIAL - Abnormal; Notable for the following components:   Neutro Abs 11.9 (*)    Monocytes Absolute 3.1 (*)    Abs Immature Granulocytes 1.01 (*)    All other components within normal limits  COMPREHENSIVE METABOLIC PANEL - Abnormal; Notable for the following components:   Sodium 123 (*)    Chloride 86 (*)    Glucose, Bld 125 (*)    Total Protein 6.4 (*)    Albumin 3.1 (*)    All other components within normal limits   URINE CULTURE  RESPIRATORY PANEL BY RT PCR (FLU A&B, COVID)  HSV 1/2 AB IGG/IGM CSF  CSF CULTURE  ANAEROBIC CULTURE  ETHANOL  PROTIME-INR  APTT  AMMONIA  HIV ANTIBODY (ROUTINE TESTING W REFLEX)  RAPID URINE DRUG SCREEN, HOSP PERFORMED  URINALYSIS, ROUTINE W REFLEX MICROSCOPIC  VARICELLA ZOSTER ANTIBODY, IGM  VARICELLA ZOSTER ANTIBODY, IGG  VDRL, CSF  VARICELLA-ZOSTER BY PCR  HERPES SIMPLEX VIRUS(HSV) DNA BY PCR  CMV DNA BY PCR, QUALITATIVE  CRYPTOCOCCAL ANTIGEN, CSF  CSF CELL COUNT WITH DIFFERENTIAL  CSF CELL COUNT WITH DIFFERENTIAL  PROTEIN AND GLUCOSE, CSF  TSH  FOLATE  I-STAT CHEM 8, ED    EKG None  Radiology MR BRAIN W WO CONTRAST  Result Date: 06/19/2020 CLINICAL DATA:  79 year old female with code stroke presentation this morning, although unilateral weakness now resolved. Allergy to iodinated contrast. EXAM: MRI HEAD WITHOUT AND WITH CONTRAST TECHNIQUE: Multiplanar, multiecho pulse sequences of the brain and surrounding structures were obtained without and with intravenous contrast. CONTRAST:  26mL GADAVIST GADOBUTROL 1 MMOL/ML IV SOLN COMPARISON:  Plain head CT earlier today. FINDINGS: Study is intermittently degraded by motion artifact despite repeated imaging attempts. Brain: No convincing restricted diffusion. Physiologic anisotropy is felt to explain a small area of increased trace DWI signal in the central midbrain (series 3, image 22) which remains normal on the remaining sequences. Widely scattered and mostly subcortical bilateral white matter T2 and FLAIR hyperintensity. Chronic lacunar infarcts versus perivascular spaces in the bilateral basal ganglia. There is a chronic lacunar infarct of the right caudate superimposed. Negative brainstem. Several tiny chronic infarcts in both cerebellar hemispheres. No midline shift, mass effect, evidence of mass lesion, ventriculomegaly, extra-axial collection or acute intracranial hemorrhage. Cervicomedullary junction and  pituitary are within normal limits. No chronic cerebral blood products identified. Following contrast no abnormal enhancement is identified. No dural thickening is evident. Vascular: Major intracranial vascular flow voids are preserved. Skull and upper cervical spine: Normal for age visible cervical spine, bone marrow signal. Sinuses/Orbits: Negative. Other: None IMPRESSION: Mildly motion degraded study but no acute intracranial abnormality is identified. Chronic small vessel disease. Study discussed by telephone with Dr. Wilford Corner on 06/19/2020 at 1350 hours. Electronically Signed   By: Odessa Fleming M.D.   On: 06/19/2020 13:59   CT HEAD CODE STROKE WO CONTRAST  Result Date: 06/19/2020 CLINICAL DATA:  Code stroke. 79 year old female with altered mental status. EXAM: CT HEAD WITHOUT CONTRAST TECHNIQUE: Contiguous axial images were obtained from the base of the skull through the vertex without intravenous contrast. COMPARISON:  None. FINDINGS: Brain: No midline shift, mass effect, or evidence of intracranial mass lesion. No ventriculomegaly. No acute intracranial hemorrhage identified. Scattered bilateral hypodensity in the cerebral white matter, basal ganglia. The thalami, brainstem and cerebellum appear spared. No superimposed cortical encephalomalacia identified. No cortically based acute infarct identified. Vascular: Calcified atherosclerosis at the skull base. There is mildly asymmetric increased density of the right MCA, best seen on sagittal image 17. Skull: No acute osseous abnormality identified. Sinuses/Orbits: Well pneumatized aside from left ethmoid mucosal thickening. Other: No acute orbit or scalp soft tissue finding. ASPECTS [facility] Stroke Program Early CT Score) Total score (0-10 with 10 being normal): 10 IMPRESSION: 1. Equivocal asymmetric increased density of the Right MCA, but no acute cortically based infarct or acute intracranial hemorrhage identified. ASPECTS 10. 2. Evidence of chronic small vessel  disease. 3. These results were discussed Dr. Tollie Eth at 11:40hours by telephone. Electronically Signed   By: Odessa Fleming M.D.   On: 06/19/2020 11:52    Procedures .Critical Care Performed by: Virgina Norfolk, DO Authorized by: Virgina Norfolk, DO   Critical care provider statement:    Critical care time (minutes):  35   Critical care was necessary to treat or prevent imminent or life-threatening deterioration of the following conditions:  CNS failure  or compromise   Critical care was time spent personally by me on the following activities:  Blood draw for specimens, development of treatment plan with patient or surrogate, discussions with consultants, discussions with primary provider, evaluation of patient's response to treatment, examination of patient, obtaining history from patient or surrogate, ordering and performing treatments and interventions, ordering and review of laboratory studies, pulse oximetry, re-evaluation of patient's condition, ordering and review of radiographic studies and review of old charts   I assumed direction of critical care for this patient from another provider in my specialty: no   .Lumbar Puncture  Date/Time: 06/19/2020 2:45 PM Performed by: Virgina Norfolk, DO Authorized by: Virgina Norfolk, DO   Consent:    Consent obtained:  Written   Consent given by:  Spouse   Risks discussed:  Bleeding, headache, infection, nerve damage, pain and repeat procedure   Alternatives discussed:  No treatment Pre-procedure details:    Procedure purpose:  Diagnostic   Preparation: Patient was prepped and draped in usual sterile fashion   Sedation:    Sedation type:  Anxiolysis Anesthesia (see MAR for exact dosages):    Anesthesia method:  Local infiltration   Local anesthetic:  Lidocaine 1% w/o epi Procedure details:    Lumbar space:  L4-L5 interspace   Patient position:  L lateral decubitus   Needle gauge:  20   Needle type:  Spinal needle - Quincke tip   Needle  length (in):  3.5   Ultrasound guidance: no     Number of attempts:  3 Post-procedure:    Puncture site:  Adhesive bandage applied   Patient tolerance of procedure:  Tolerated well, no immediate complications Comments:     Unable to obtain CSF, IR consulted.   (including critical care time)  Medications Ordered in ED Medications  levETIRAcetam (KEPPRA) tablet 500 mg (has no administration in time range)  acyclovir (ZOVIRAX) 800 mg in dextrose 5 % 150 mL IVPB (has no administration in time range)  LORazepam (ATIVAN) injection 1 mg (has no administration in time range)  levETIRAcetam (KEPPRA) IVPB 1500 mg/ 100 mL premix (1,500 mg Intravenous New Bag/Given 06/19/20 1522)  LORazepam (ATIVAN) injection 1 mg (1 mg Intravenous Given 06/19/20 1311)  gadobutrol (GADAVIST) 1 MMOL/ML injection 6 mL (6 mLs Intravenous Contrast Given 06/19/20 1349)    ED Course  I have reviewed the triage vital signs and the nursing notes.  Pertinent labs & imaging results that were available during my care of the patient were reviewed by me and considered in my medical decision making (see chart for details).    MDM Rules/Calculators/A&P                          Luann Aspinwall is an 79 year old female who presents to the ED with strokelike symptoms.  Blood pressure mildly elevated but otherwise normal vitals.  Went directly to CT with neurology.  There was overall equivocal asymmetric increased density of the right MCA.  No head bleed.  However neurology more concern for likely shingles encephalitis.  Will empirically start IV acyclovir and IV Keppra given that she had headache and seizure-like activity prior to arrival.  Will get an MRI with and without contrast to evaluate for stroke or other etiology of symptoms.  Will attempt lumbar puncture and get remaining lab work.  Overall she is confused and not really focally weak but she does maybe have some left-sided neglect.  Per EMS she did have left-sided  weakness  but that may be appears to have improved.  Patient does have shingles type rash to the left side of her chest and back.  Lab work significant for leukocytosis of 18.  INR normal.  Platelets normal.  Otherwise no significant anemia.  Sodium is 123.  Patient with unremarkable MRI.  Lumbar puncture by myself was unsuccessful.  IR lumbar puncture has been ordered.  Patient to be admitted to medicine for further care.  Overall encephalopathic possibly from shingles encephalitis.  Could be related to electrolyte abnormalities.  To be admitted to medicine for further care.  This chart was dictated using voice recognition software.  Despite best efforts to proofread,  errors can occur which can change the documentation meaning.    Final Clinical Impression(s) / ED Diagnoses Final diagnoses:  Encephalitis  Hyponatremia  Herpes zoster without complication    Rx / DC Orders ED Discharge Orders    None       Virgina NorfolkCuratolo, Wanona Stare, DO 06/19/20 1447    Virgina Norfolkuratolo, Kyal Arts, DO 06/19/20 1557

## 2020-06-19 NOTE — Procedures (Signed)
Patient Name: Alyssa Hudson  MRN: [id number]  Epilepsy Attending: Charlsie Quest  Referring Physician/Provider: Dr Erick Blinks Date: 06/19/2020 Duration: 27.34 mins  Patient history: 79yo F headache, ams, left sided weakness. EEG to evaluate for seizure.   Level of alertness: Awake/ lethargic  AEDs during EEG study: LEV  Technical aspects: This EEG study was done with scalp electrodes positioned according to the 10-20 International system of electrode placement. Electrical activity was acquired at a sampling rate of 500Hz  and reviewed with a high frequency filter of 70Hz  and a low frequency filter of 1Hz . EEG data were recorded continuously and digitally stored.   Description: No posterior dominant rhythm was seen. EEG showed continuous generalized and maximal right temporal region 3-6hz  theta-delta slowing. Hyperventilation and photic stimulation were not performed.     ABNORMALITY -Continuous slow, generalized and maximal right temporal region  IMPRESSION: This study is suggestive of cortical dysfunction in right temporal region suggestive of underling structural abnormality, post-ictal state. Additionally there is moderate diffuse encephalopathy, nonspecific etiology but could be related to post ictal state. No seizures or epileptiform discharges were seen throughout the recording.  Dariah Mcsorley 

## 2020-06-19 NOTE — Progress Notes (Signed)
EEG complete - results pending 

## 2020-06-19 NOTE — Progress Notes (Addendum)
Pharmacy Antibiotic Note  Alyssa Hudson is a 79 y.o. female admitted on 06/19/2020 presenting as code stroke, known shingles infection and concern for related encephalopathy.  Pharmacy has been consulted for acyclovir dosing.  Pt not following commands at this time and not a good PO candidate for tx.    Will dose 10mg /kg based on actual body weight since BMI <30.   Plan: Acyclovir 800 mg IV every 8 hours   Monitor renal function, encephalopathy workup/LP/MRI, ability to transition to PO  Height: 5\' 5"  (165.1 cm) IBW/kg (Calculated) : 57  Temp (24hrs), Avg:98.2 F (36.8 C), Min:98.2 F (36.8 C), Max:98.2 F (36.8 C)  No results for input(s): WBC, CREATININE, LATICACIDVEN, VANCOTROUGH, VANCOPEAK, VANCORANDOM, GENTTROUGH, GENTPEAK, GENTRANDOM, TOBRATROUGH, TOBRAPEAK, TOBRARND, AMIKACINPEAK, AMIKACINTROU, AMIKACIN in the last 168 hours.  CrCl cannot be calculated (No successful lab value found.).    Allergies  Allergen Reactions  . Iodine-131 Swelling and Hives    , PharmD PGY1 Acute Care Pharmacy Resident Please refer to [facility] for unit-specific pharmacist

## 2020-06-19 NOTE — ED Triage Notes (Signed)
Patient brought in by [facility] for stroke symptoms, code stroke activated prior to arrival. EMS reports patient last known well last night, specific time unknown. [facility] states patient "woke up this morning unresponsive" and had left sided weakness that resolved before arriving to hospital. Patient alert and moving all extremities.

## 2020-06-19 NOTE — H&P (Addendum)
Date: 06/19/2020               Patient Name:  Alyssa Hudson MRN: [id number]  DOB: Feb 03, 1941 Age / Sex: 79 y.o., female   PCP: Paulina Fusi, MD         Medical Service: Internal Medicine Teaching Service         Attending Physician: Dr. Sandre Kitty Elwin Mocha, MD    First Contact: Dr. Laddie Aquas Pager: [phone]  Second Contact: Dr. Maryla Morrow Pager: [phone]       After Hours (After 5p/  First Contact Pager: [phone]  weekends / holidays): Second Contact Pager: [phone]   Chief Complaint: Abnormal Movements   History of Present Illness:  Alyssa Hudson is an 79 y.o. lady with PMHx HTN, recent shingles, and possible carpal tunnel syndrome who is being admitted for abnormal movements and altered mental status, initially concerning for stroke vs. seizure. Her husband states she has had significant troubles with shingles recently. He states she had a flare along her neck in May of this year that resolved; however, she experienced repeat shingles flare along her left breast, extending to her left axillary region, that he states has been worsening over the past 3 weeks. She has experienced severe pain due to this. He also states she has experienced chills with this, but denies fevers, saying her temperature at home has remained < 98 degrees. He says that she has had nausea with non-bloody vomiting and decreased appetite for about 6 days now. He states she has also complained of severe abdominal pain during this time. He said she has complained of headache for the past 2-3 days, that acutely worsened early this morning. She took her blood pressure was significantly elevated to 198/168, unusual for her. He states the patient went to rest in a recliner as she usually does, fell asleep for about 2 hours, but then he noticed twitching movements in her bilateral feet. He states she usually has restless legs when she sleeps, but states these movements were unusual for her. She then suddenly developed  clenched fists and shaking of her bilateral arms. Husband noted that her eyes rolled back into her head and she had white froth coming out of the corner of her mouth. He says that she remained confused thereafter. However, he states she was able to answer questions appropriately in the emergency department. He says at her baseline, she is alert and oriented, able to carry out her day to day activities without help. He denies any known history of seizures or stroke. 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 She had received medication for anxiety/claustrophobia prior to her MRI and LP just before we went to see her and was unable to provide any history or speak with [place]. Of note, she was snoring loudly when we saw her, and husband notes this is new for her as of today.  Meds:  Amlodipine 5mg  daily (see below) Gabapentin 600mg  three times daily HCTZ 25mg  daily Losartan 100mg  daily Metoprolol succinate 100mg  daily Recent Valacyclovir, stopped with 4 remaining doses (unknown dosing)   Husband is not sure if patient has been taking her amlodipine 5mg  daily   Allergies: Allergies as of 06/19/2020 - Review Complete 06/19/2020  Allergen Reaction Noted  . Codeine Nausea Only and Nausea And Vomiting 05/25/2016  . Contrast media [iodinated diagnostic agents] Other (See Comments) 06/19/2020  .  Iodine-131 Hives and Swelling 05/26/2016  . Sulfa antibiotics Nausea Only 05/26/2016   Past Medical History:  Diagnosis Date  . Facial droop   . Hypertension     Family History: Patient's husband is unaware of any family history. No known family history of seizures or stroke.   Social History: Patient lives at home with her husband. Their son, Alyssa Hudson, lives just down their street and is close with them. She is a never smoker, does not drink alcohol, and does not use any other drugs.   Review of  Systems: A complete ROS was negative except as per HPI.   Physical Exam: Blood pressure (!) 132/58, pulse 76, temperature 99.4 F (37.4 C), temperature source Oral, resp. rate 19, height 5\' 5"  (1.651 m), weight 81.6 kg, SpO2 96 %.   General: Patient is obtunded and unable to participate on examination. Eyes: Eyelids closed. Pupils are ~31mm, reactive to light, and symmetric bilaterally. Minimal conjunctival injection. No discharge.  HENT: No nasal discharge. Respiratory: Lung sounds difficult to exam, although CTA anteriorly with regular breathing. Patient snoring on exam. Mildly tachypneic. Cardiovascular: Regular rate and rhythm. No murmurs, rubs, or gallops. There is 1+ bilateral pitting LE edema.  Abdominal: There is moderate distention with positive fluid wave. Abdomen is soft and non-tender to palpation. Bowel sounds intact. No rebound or guarding. There is a reducible umbilical hernia that is not tender to palpation. Skin: There is a vesicular rash with an erythematous base and scab-like lesions in various stages of healing along the lateral left breast, extending into the left axillary lesion.  Musculoskeletal: Muscle tone is decreased, muscle bulk is normal. Charcot foot bilaterally. Neurological: Patient is obtunded. She responds to touch but remains non-verbal.    History, ROS, and physical exam are limited level 5 caveat due to patient non-verbal status with altered consciousness.   EKG: personally reviewed my interpretation is NSR at 77 bpm with PR prolongation consistent with 1st degree AV block. Normal QTc. No consecutive T wave inversions or ST segment changes concerning for acute ischemia. No priors for comparison.  CXR: personally reviewed my interpretation is difficult study to interpret due to significant rotation, although no clear acute cardiopulmonary process.   CT Head without contrast:   IMPRESSION: 1. Equivocal asymmetric increased density of the Right MCA, but  no acute cortically based infarct or acute intracranial hemorrhage identified. ASPECTS 10. 2. Evidence of chronic small vessel disease. 3. These results were discussed Dr. 1m at 11:40hours by telephone.  Electronically Signed   By: Tollie Eth M.D.   On: 06/19/2020 11:52  MR Brain without Contrast: FINDINGS: Study is intermittently degraded by motion artifact despite repeated imaging attempts. Brain: No convincing restricted diffusion. Physiologic anisotropy is felt to explain a small area of increased trace DWI signal in the central midbrain (series 3, image 22) which remains normal on the remaining sequences. Widely scattered and mostly subcortical bilateral white matter T2 and FLAIR hyperintensity. Chronic lacunar infarcts versus perivascular spaces in the bilateral basal ganglia. There is a chronic lacunar infarct of the right caudate superimposed. Negative brainstem. Several tiny chronic infarcts in both cerebellar hemispheres. No midline shift, mass effect, evidence of mass lesion, ventriculomegaly, extra-axial collection or acute intracranial hemorrhage. Cervicomedullary junction and pituitary are within normal limits. No chronic cerebral blood products identified. Following contrast no abnormal enhancement is identified. No dural thickening is evident. Vascular: Major intracranial vascular flow voids are preserved.  IMPRESSION: Mildly motion degraded study but no acute intracranial abnormality is  identified. Chronic small vessel disease.  Electronically Signed   By: Odessa FlemingH  Hall M.D.   On: 06/19/2020 13:59  Assessment & Plan by Problem: Active Problems:   Encephalopathy  # Acute Encephalopathy  # Active Varicella Zoster Infection Patient developed seizure-like symptoms acutely this morning following severe headache and abdominal pain, in the setting of acute shingles infection. Husband endorses decreased appetite x 6 days with nausea, vomiting, chills, and  decreased PO intake. Husband states that at baseline she is oriented and independent in ADL's, although she had an episode of decreased responsiveness this morning and is currently obtunded and non-verbal / snoring s/p anxiety medication for MRI / LP. CT head without contrast showed small vessel disease and an equivocal asymmetric increased density of the Right MCA, but no acute cortically based infarct or acute intracranial hemorrhage. MRI brain without contrast showed chronic lacunar infarcts vs. Perivascular spaces in the bilateral BG and chronic lacunar infarct of the right caudate with tiny infarcts of bilateral cerebellar hemispheres, but no acute intracranial abnormality. Encephalopathy may be due to varicella infection vs. Sedation on gabapentin at home, 2/2 hyperammonemia in setting of abdominal ascites, hypothyroidism vs. Other toxic-metabolic derangement, anoxic/hypoxic brain injury. Less likely due to hyponatremia. ETOH negative.  - Awaiting final EEG reading  - Seizure precautions  - Empirically started on Keppra 500mg  BID - Check varicella zoster IgM/IgG, varicella zoster PCR, CMV PCR  - Start Acyclovir 800mg  three times daily - LP obtained, pending CSF VDRL, HSV 1/2 Ab IgG/IgM, cryptococcal antigen, CSF cell count and culture, protein and glucose  - Check folate, TSH - U/A, urine culture, and rapid drug screen pending  - Check blood cultures - Ammonia pending  - Cardiac monitoring  - NPO until passes swallow test - Odansetron PRN for nausea  - Morning CMP  # Hypochloremic Hyponatremia  Initial sodium 123, chloride 86. Other electrolytes WNL. Patient clinically appears mildly volume up. Consider cardiac etiology vs. Cirrhosis vs. Nephrosis, also consider SIADH or hypothyroidism.  - Check urine sodium - check U/A, THS, abdominal ultrasound   # Leukocytosis  Initial WBC 18.1. ANC 11.9, monocyte count 3.1, absolute immature granulocytes 1.01. Likely in the setting of ongoing  varicella zoster infection. Consider pulmonary cause given significantly rotated CXR, SpO2 94% on R/A, although influenza and COVID testing are negative. HIV negative.  - Continue to monitor daily CBC  - tylenol PRN  # Possible Abdominal Ascites  Patient has moderate abdominal distention, although abdomen is soft, without obvious TTP, guarding or rebound. Patient also has bilateral 1+ pitting edema of the LE's. Albumin 3.1, TP 6.4, sodium 123. Husband notes no alcohol use or other medical history concerning for cirrhosis, although consider possibility of NASH/NAFLD.  - Check daily weights  - Strict I&O  - consider morning complete abdominal ultrasound   # Hypertension  Patient takes HCTZ 25mg , Losartan 100mg , and Metoprolol succinate 100mg  daily. It is unclear if patient has been taking her amlodipine 5mg  daily at home. Initial pressure 166/79, although significantly improved without home medications in ED. - Will hold home antihypertensives for now - continue regular vitals  # Hyperglycemia  Glucose on CMP 125 in the setting of poor PO intake. Husband denies known history of DM although he notes he believes she has carpal tunnel with R thumb weakness and she appears to have Charcot foot bilaterally.  - continue CBG monitoring  - If continuously elevated, get Hb A1c   Code Status: Full code Diet: NPO until passes swallow study  DVT PPx: Lovenox  IVF: None   Dispo: Admit patient to Inpatient with expected length of stay greater than 2 midnights.  Signed: Glenford Bayley, MD 06/19/2020, 8:53 PM  Pager: [phone] After 5pm on weekdays and 1pm on weekends: On Call pager: [phone]

## 2020-06-19 NOTE — ED Notes (Signed)
[phone] Alyssa Hudson would like to disclose information and like an update.

## 2020-06-19 NOTE — ED Notes (Signed)
Attempted report 

## 2020-06-20 ENCOUNTER — Inpatient Hospital Stay (HOSPITAL_COMMUNITY): Payer: Medicare PPO

## 2020-06-20 DIAGNOSIS — E878 Other disorders of electrolyte and fluid balance, not elsewhere classified: Secondary | ICD-10-CM

## 2020-06-20 DIAGNOSIS — K7460 Unspecified cirrhosis of liver: Secondary | ICD-10-CM

## 2020-06-20 DIAGNOSIS — E7220 Disorder of urea cycle metabolism, unspecified: Secondary | ICD-10-CM

## 2020-06-20 DIAGNOSIS — E871 Hypo-osmolality and hyponatremia: Secondary | ICD-10-CM | POA: Diagnosis present

## 2020-06-20 DIAGNOSIS — R188 Other ascites: Secondary | ICD-10-CM

## 2020-06-20 DIAGNOSIS — R160 Hepatomegaly, not elsewhere classified: Secondary | ICD-10-CM

## 2020-06-20 DIAGNOSIS — R29810 Facial weakness: Secondary | ICD-10-CM

## 2020-06-20 DIAGNOSIS — I10 Essential (primary) hypertension: Secondary | ICD-10-CM

## 2020-06-20 DIAGNOSIS — R161 Splenomegaly, not elsewhere classified: Secondary | ICD-10-CM

## 2020-06-20 DIAGNOSIS — G92 Toxic encephalopathy: Secondary | ICD-10-CM

## 2020-06-20 DIAGNOSIS — K5730 Diverticulosis of large intestine without perforation or abscess without bleeding: Secondary | ICD-10-CM

## 2020-06-20 DIAGNOSIS — B028 Zoster with other complications: Secondary | ICD-10-CM

## 2020-06-20 DIAGNOSIS — N10 Acute tubulo-interstitial nephritis: Secondary | ICD-10-CM

## 2020-06-20 DIAGNOSIS — R7303 Prediabetes: Secondary | ICD-10-CM

## 2020-06-20 DIAGNOSIS — N390 Urinary tract infection, site not specified: Secondary | ICD-10-CM

## 2020-06-20 DIAGNOSIS — K5792 Diverticulitis of intestine, part unspecified, without perforation or abscess without bleeding: Secondary | ICD-10-CM

## 2020-06-20 DIAGNOSIS — K760 Fatty (change of) liver, not elsewhere classified: Secondary | ICD-10-CM

## 2020-06-20 DIAGNOSIS — Z7901 Long term (current) use of anticoagulants: Secondary | ICD-10-CM

## 2020-06-20 HISTORY — DX: Hypo-osmolality and hyponatremia: E87.1

## 2020-06-20 LAB — COMPREHENSIVE METABOLIC PANEL
ALT: 16 U/L (ref 0–44)
AST: 16 U/L (ref 15–41)
Albumin: 2.8 g/dL — ABNORMAL LOW (ref 3.5–5.0)
Alkaline Phosphatase: 77 U/L (ref 38–126)
Anion gap: 13 (ref 5–15)
BUN: 19 mg/dL (ref 8–23)
CO2: 25 mmol/L (ref 22–32)
Calcium: 8.2 mg/dL — ABNORMAL LOW (ref 8.9–10.3)
Chloride: 85 mmol/L — ABNORMAL LOW (ref 98–111)
Creatinine, Ser: 1.35 mg/dL — ABNORMAL HIGH (ref 0.44–1.00)
GFR calc Af Amer: 43 mL/min — ABNORMAL LOW (ref >60)
GFR calc non Af Amer: 37 mL/min — ABNORMAL LOW (ref >60)
Glucose, Bld: 110 mg/dL — ABNORMAL HIGH (ref 70–99)
Potassium: 3.5 mmol/L (ref 3.5–5.1)
Sodium: 123 mmol/L — ABNORMAL LOW (ref 135–145)
Total Bilirubin: 0.8 mg/dL (ref 0.3–1.2)
Total Protein: 5.8 g/dL — ABNORMAL LOW (ref 6.5–8.1)

## 2020-06-20 LAB — URINALYSIS, ROUTINE W REFLEX MICROSCOPIC
Bilirubin Urine: NEGATIVE
Glucose, UA: 50 mg/dL — AB
Ketones, ur: NEGATIVE mg/dL
Nitrite: NEGATIVE
Protein, ur: 100 mg/dL — AB
Specific Gravity, Urine: 1.015 (ref 1.005–1.030)
pH: 8 (ref 5.0–8.0)

## 2020-06-20 LAB — SODIUM, URINE, RANDOM: Sodium, Ur: 75 mmol/L

## 2020-06-20 LAB — CBC
HCT: 35.8 % — ABNORMAL LOW (ref 36.0–46.0)
Hemoglobin: 12.1 g/dL (ref 12.0–15.0)
MCH: 27.5 pg (ref 26.0–34.0)
MCHC: 33.8 g/dL (ref 30.0–36.0)
MCV: 81.4 fL (ref 80.0–100.0)
Platelets: 243 10*3/uL (ref 150–400)
RBC: 4.4 MIL/uL (ref 3.87–5.11)
RDW: 13.7 % (ref 11.5–15.5)
WBC: 16.4 10*3/uL — ABNORMAL HIGH (ref 4.0–10.5)
nRBC: 0 % (ref 0.0–0.2)

## 2020-06-20 LAB — URINALYSIS, MICROSCOPIC (REFLEX)
Bacteria, UA: NONE SEEN
WBC, UA: >50 WBC/hpf (ref 0–5)

## 2020-06-20 LAB — RAPID URINE DRUG SCREEN, HOSP PERFORMED
Amphetamines: NOT DETECTED
Barbiturates: NOT DETECTED
Benzodiazepines: NOT DETECTED
Cocaine: NOT DETECTED
Opiates: NOT DETECTED
Tetrahydrocannabinol: NOT DETECTED

## 2020-06-20 LAB — TSH: TSH: 5.102 u[IU]/mL — ABNORMAL HIGH (ref 0.350–4.500)

## 2020-06-20 LAB — OSMOLALITY: Osmolality: 259 mOsm/kg — ABNORMAL LOW (ref 275–295)

## 2020-06-20 LAB — VARICELLA ZOSTER ANTIBODY, IGG: Varicella IgG: 3024 index (ref >165)

## 2020-06-20 LAB — OSMOLALITY, URINE: Osmolality, Ur: 505 mOsm/kg (ref 300–900)

## 2020-06-20 LAB — HEMOGLOBIN A1C
Hgb A1c MFr Bld: 6.1 % — ABNORMAL HIGH (ref 4.8–5.6)
Mean Plasma Glucose: 128.37 mg/dL

## 2020-06-20 LAB — GLUCOSE, CAPILLARY: Glucose-Capillary: 90 mg/dL (ref 70–99)

## 2020-06-20 LAB — FOLATE: Folate: 33 ng/mL (ref >5.9)

## 2020-06-20 MED ORDER — AMOXICILLIN-POT CLAVULANATE 875-125 MG PO TABS
1.0000 | ORAL_TABLET | Freq: Two times a day (BID) | ORAL | Status: DC
  Administered 2020-06-20: 1 via ORAL
  Filled 2020-06-20 (×2): qty 1

## 2020-06-20 MED ORDER — SODIUM CHLORIDE 0.9 % IV SOLN
100.0000 mg | Freq: Two times a day (BID) | INTRAVENOUS | Status: AC
  Administered 2020-06-20 – 2020-06-25 (×11): 100 mg via INTRAVENOUS
  Filled 2020-06-20 (×13): qty 100

## 2020-06-20 MED ORDER — DEXTROSE 5 % IV SOLN
800.0000 mg | Freq: Two times a day (BID) | INTRAVENOUS | Status: DC
  Administered 2020-06-20 – 2020-06-21 (×2): 800 mg via INTRAVENOUS
  Filled 2020-06-20 (×3): qty 16

## 2020-06-20 NOTE — Progress Notes (Signed)
OVERNIGHT NOTE:  Paged by bedside RN regarding the CMV lab. She notes that lab notified her that it is labeled as blood.  Chart reviewed. Appears that LP attempt was unsuccessful. IR has been consulted for LP.  Bedside RN also brings up the q4h bladder scans. I am unsure why these were ordered but UOP is low--~200cc since admission.   Plan --day team to address source of CMV that they want to collect --Continue q4h bladder scans. Day team to address reasoning and need for continuation  Elige Radon, MD Internal Medicine Resident PGY-2 [facility] Pager: [phone] 06/20/2020 9:13 PM

## 2020-06-20 NOTE — Progress Notes (Signed)
Pharmacy Antibiotic Note  Alyssa Hudson is a 79 y.o. female admitted on 06/19/2020 presenting as code stroke, known shingles infection and concern for related encephalopathy.  Pharmacy has been consulted for acyclovir dosing.  Pt not following commands at this time and not a good PO candidate for tx.    Will dose 10mg /kg based on actual body weight since BMI <30.   LP was performed 9/24 but CSF was not obtained. MRI showed no acute intracranial abnormality.  Plan: Acyclovir 800 mg IV changed to every 12 hours based on worsening renal function 9/25 Monitor renal function, encephalopathy workup, ability to transition to PO  Height: 5\' 5"  (165.1 cm) Weight: 81.6 kg (180 lb) IBW/kg (Calculated) : 57  Temp (24hrs), Avg:98.1 F (36.7 C), Min:97.4 F (36.3 C), Max:99.4 F (37.4 C)  Recent Labs  Lab 06/19/20 1356 06/19/20 2243 06/20/20 0322  WBC 18.1*  --  16.4*  CREATININE 0.72 0.95 1.35*    Estimated Creatinine Clearance: 34.5 mL/min (A) (by C-G formula based on SCr of 1.35 mg/dL (H)).    Allergies  Allergen Reactions  . Codeine Nausea Only and Nausea And Vomiting    Insomnia  . Contrast Media [Iodinated Diagnostic Agents] Other (See Comments)    Turned red all over  . Iodine-131 Hives and Swelling  . Sulfa Antibiotics Nausea Only    06/21/20, PharmD PGY-1 Acute Care Pharmacy Resident Please refer to [facility] for unit-specific pharmacist 06/20/2020 10:20 AM

## 2020-06-20 NOTE — Progress Notes (Signed)
Subjective:   Ms. Purk is more awake and conversational this morning, though still remains confused and not at baseline (~90% to baseline per husband). She agrees that she feels more agitated and restless than usual and says she has not felt like this in the past. She is unable to say whether she personally feels confused or tired, and denies any abdominal pain, nausea, headache, difficulty urinating, pain on urination, constipation, diarrhea, swelling, or any other symptoms; however, she does ask "what's wrong with me" and agrees she doesn't feel herself.  Objective:  Vital signs in last 24 hours: Vitals:   06/20/20 0406 06/20/20 0853 06/20/20 1148 06/20/20 1553  BP: (!) 102/59 140/61 (!) 141/59 (!) 144/72  Pulse: 72 75 75 84  Resp: 18 20 18 20   Temp: 97.9 F (36.6 C) (!) 97.4 F (36.3 C) 98.1 F (36.7 C) 98.3 F (36.8 C)  TempSrc: Oral Oral Oral Oral  SpO2: 95% 95% 97% 95%  Weight:      Height:       General: Patient appears slightly disheveled and dazed. No acute distress.  Eyes: Sclera non-icteric. No conjunctival injection. PERRLA. There is significant periorbital edema.  Respiratory: Lungs are CTA, bilaterally. No wheezes, rales, or rhonchi.  Cardiovascular: Regular rate and rhythm. No murmurs, rubs, or gallops. There is 1+ bilateral lower extremity pitting edema. Superficial veins visualized though not palpable on bilateral lower extremities. JVD present.  Abdominal: There is moderate abdominal distention, although abdomen is soft and non-tender to palpation. Bowel sounds intact. No rebound or guarding. There is a reducible, non-tender, non-erythematous umbilical hernia.  Musculoskeletal: Charcot deformity present in bilateral feet. Neurological: Facial droop is present along areas innervated by V1-V3 on the left. Strength is 5/5 in all four extremities. There is myoclonus of the lower extremities and left upper extremity. No significant asterixes is present. There is  decreased sensation along the right lower extremity. Sensation intact in other extremities. Patient is alert and answers questions appropriately, although she appears restless and slightly agitated.  Skin: There are scabbed vesicles on an erythematous base extending across the lateral left breast into the left axillary region of the left upper extremity. No other evidence of lesions or masses.  Psych: Patient appears restless and slightly agitated / anxious.   Assessment/Plan:  Active Problems:   Encephalopathy  # Acute Encephalopathy Patient developed seizure-like symptoms acutely the morning of 06/19/20 following severe headache and abdominal pain, in the setting of acute shingles infection. Husband endorses decreased appetite x 6 days with nausea, vomiting, chills, and decreased PO intake. Differential is wide and includes seizure/post-ictal state, VZV encephalitis, other infectious cause, delirium in setting of possible UTI vs. Pyelonephritis, diverticulitis, hyponatremia-induced vs. medication induced (on high dose gabapentin at home). Husband states that at baseline she is oriented and independent in ADL's. CT head without contrast showed small vessel disease and an equivocal asymmetric increased density of the Right MCA, but no acute cortically based infarct or acute intracranial hemorrhage. MRI brain without contrast showed chronic lacunar infarcts vs. perivascular spaces in the bilateral BG and chronic lacunar infarct of the right caudate with tiny infarcts of bilateral cerebellar hemispheres, but no acute intracranial abnormality. EEG showed cortical dysfunction of the R temporal region and moderate diffuse encephalopathy possibly consistent with post-ictal state; however, no seizures or epileptiform discharge were noted. See below dx for full differential:  - Neurology on board, appreciate their recommendations - Empirically treating with acyclovir and doxycycline for VZV encephalitis and  tick-borne illness -  Treating Pyelonephritis / Diverticulitis with augmentin  - Treating hyponatremia with fluid restriction to daily  - Blood cultures negative x 24 hours   # Hypotonic, Hypochloremic Hyponatremia  Initial sodium 123, chloride 86, Serum osms 259, Urine osms and urine sodium inappropriately elevated at 505 and 75, respectively. Patient is on HCTZ 25mg  and Losartan 100mg  daily at home. Patient appears hypervolemic with 1+ bilateral pitting edema and JVD. Creatinine and BUN uptrending. Likely renal component, including SIADH and/or RAAS abnormality / medication effect. Also consider cirrhosis / CHF. No ECHO on file although CT abdomen/pelvis showed possible cirrhosis, mild hepatic steatosis.  - Patient passed bedside swallow evaluation  - Will start on carb-modified diet with fluid restriction, daily  - Will avoid IVF  - Monitor BMP q6 hours for sodium correction, goal < 10 point correction per 24 hours   # Active Varicella Zoster Infection Patient's husband states patient was diagnosed with shingles in May of this year, which resolved, but developed recurrence along her left breast/axillary region x 3 weeks. Patient had been in severe pain but had stopped taking her home Valtrex with 4 remaining doses prior to admission. May be contributing factor to encephalitis. - Patient started on empirical Acyclovir until LP able to be obtained - Patient is s/p many unsuccessful LP attempts due to agitation / movement  - Monitor for improvement in rash and mental status   # Likely Pyelonephritis  U/A showed cloudy urine with large leukocytes, negative nitrites, small hemoglobinuria, no bacteria, >50 WBC, 11-20 RBC and mucus. Triple phosphate crystals present. CT abdomen/pelvis without contrast showed perinephric stranding but no nephrolithiasis. Bladder distended with air-fluid level. Patient afebrile, initial WBC 18.1. Denies associated symptoms at this time.  - Urine culture and  sensitivities pending  - Started empiric treatment with Augmentin 875mg  q12 hours x 10-14 days  - Will check bladder scan q4 hours with I&O cath if >300cc's    # Diverticulosis with possible acute Diverticulitis  Patient has had nausea, vomiting, decreased PO intake, and intermittent severe abdominal pain per patient's husband with benign physical exam aside from moderate abdominal distention. Patient afebrile. CT abdomen pelvis showed colonic diverticulosis with mild pericolonic fat stranding involving the junction of the descending and sigmoid colon in the region of multiple diverticula, suspicious for acute diverticulitis without perforation or abscess.  - Started on Augmentin 875mg  q12 hrs  - Continue to monitor daily CBC, vitals   # Hepatomegaly with Borderline Hepatic Steatosis and Possible Cirrhosis  Husband notes no alcohol history, ETOH < 10, urine tox negative. HIV negative, no known history of cirrhosis. Likely due to NASH given HTN, pre-diabetes, patient is overweight. Albumin 3.1, no thrombocytopenia. - Will check HCV Antibodies  - Will check PT/INR   # Mild Splenomegaly Platelets 271 without thrombocytopenia. WBC 18.1 with neutrophil predominance, though monocyte count 3.1. Unclear etiology. - No follow up at this time   # History of HTN Patient takes HCTZ 25mg , Losartan 100mg , and Metoprolol succinate 100mg  daily. It is unclear if patient has been taking her amlodipine 5mg  daily at home. Initial pressure 166/79, although significantly improved without home medications in ED. Vitals today mildly elevated.  - Will hold home antihypertensives for now although will likely start on amlodipine if pressures elevated tomorrow  - Patient may benefit outpatient from lisinopril if microalbuminuria continues   # Pre-Diabetes Hgb A1c 6.1 this admission. Patient not on any glucose lowering agents with no known hx DM.  - Sugars may be elevated in  recent inflammation  - Continue to monitor  daily BG    Encephalopathy may be due to varicella infection vs. Sedation on gabapentin at home, 2/2 hyperammonemia in setting of abdominal ascites, hypothyroidism vs. Other toxic-metabolic derangement, anoxic/hypoxic brain injury. Less likely due to hyponatremia. ETOH negative.   Prior to Admission Living Arrangement: Home Anticipated Discharge Location: Home vs. SNF Barriers to Discharge: Acute encephalopathy, possible infectious etiologies   Glenford Bayley, MD 06/20/2020, 7:21 PM Pager: [phone] After 5pm on weekdays and 1pm on weekends: On Call pager [phone]

## 2020-06-20 NOTE — Progress Notes (Addendum)
NEURO HOSPITALIST PROGRESS NOTE   Subjective:   Alyssa Hudson was laying comfortably in bed on my visit, her youngest son, Alyssa Hudson, at the bedside. Alyssa Hudson reports that Alyssa Hudson was in her usual state of health on Monday.  Alyssa Hudson failed two attempts at LP (bedside and IR) yesterday. 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  EEG on 24Sep2021 showed diffuse encephalopathy, post-ictal state, no seizure activity.   Obejctive:  Today's Vitals   06/19/20 2028 06/19/20 2306 06/20/20 0406 06/20/20 0853  BP: (!) 132/58 137/61 (!) 102/59 140/61  Pulse: 76 72 72 75  Resp: 19 17 18 20   Temp: 99.4 F (37.4 C) 97.6 F (36.4 C) 97.9 F (36.6 C) (!) 97.4 F (36.3 C)  TempSrc: Oral Oral Oral Oral  SpO2: 96% 98% 95% 95%  Weight:      Height:      PainSc: 0-No pain 0-No pain     Body mass index is 29.95 kg/m.  General: WD female in bed.  HEENT: Normocephalic, normal external eyes, nose, ears. 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 Neurological: Alyssa Hudson is oriented only to self and can occasionally recall a family member's name. Is unsure who the man at the bedside is. She can occasionally follow commands, but most commonly performs an incorrect movement. Cranial nerves: Pupils are equal and round, visual fields appear full. Hearing intact, eyelid raise symmetrical. Reduced nasolabial fold on the right as noted above. Motor: Raises arms and legs antigravity. Strength 4/5 in  hands and biceps, did not comply with remainder of exam. Pronator drift not present. Asterixis not present. Reflexes: 1+ in uppers Sensory: Intact to light tough throughout Coordination: Heel to shin completed, some ataxia noted but unclear if she was able to fully comprehend request. Finger to nose slow, without ataxia.  No current  facility-administered medications on file prior to encounter.   Current Outpatient Medications on File Prior to Encounter  Medication Sig Dispense Refill   acetaminophen (TYLENOL) 500 MG tablet Take 500 mg by mouth every 6 (six) hours as needed for headache.     Biotin 5000 MCG TABS Take 5,000 mcg by mouth daily.     CRANBERRY PO Take 1 tablet by mouth daily.     gabapentin (NEURONTIN) 300 MG capsule Take 300 mg by mouth 3 (three) times daily.      hydrochlorothiazide (HYDRODIURIL) 25 MG tablet Take 25 mg by mouth daily.     LIDOCAINE EX Apply 1 application topically 3 (three) times daily as needed (shingles pain). cream     LIDOCAINE PAIN RELIEF EX Place 1 patch onto the skin daily as needed (shingles pain).     losartan (COZAAR) 100 MG tablet Take 100 mg by mouth daily.     metoprolol succinate (TOPROL-XL) 100 MG 24 hr tablet Take 100 mg by mouth daily.     OVER THE COUNTER MEDICATION Take 2 capsules by mouth 2 (two) times daily. "macular protect"     Probiotic Product (PROBIOTIC PO) Take 1 tablet by mouth daily.     amLODipine (NORVASC) 5 MG tablet Take 5 mg by mouth daily.     valACYclovir (VALTREX) 1000 MG tablet Take 1,000 mg by mouth 3 (three) times daily.      Assessment and Plan per attending neurologist.  Alyssa Morn, PhD, PA-C [facility] [phone]  Attending addendum Patient  seen and examined. Son at bedside. Last known normal sometime Monday when she had lunch with family. Has been confused since. Shingles for the last few weeks. Started on acyclovir in the ED with concern for possible VZV/viral encephalitis. LP under fluoroscopy guidance also failed due to patient agitation. On examination nonfocal but encephalopathic. Detailed exam above which I also performed independently at bedside.  Labs reveal possible leukocytosis, UTI. Sodium 123. Creatinine 1.35 which is worse than baseline.  Assessment: 79 year old past history of  hypertension, shingles, no left facial droop which is not new given his culture for altered mental status and resolving left-sided weakness. No left-sided weakness noted but remains encephalopathic. Not so much difficulty following commands but more problems with attention concentration. Labs reveal severe hyponatremia. Urinalysis reveals large leukocyte esterase, more than 50 white cells. I suspect that her current presentation is likely secondary to toxic metabolic encephalopathy from the UTI and hyponatremia/AKI. That said, given the recent viral flare, a spinal tap should be performed because of the altered mental status and no clear answer.  Recommendations: Reattempt LP under fluoroscopy guidance when more stable Continue acyclovir till CSF HSV and VZV PCR is negative. Correct hyponatremia-per primary team. Be mindful of not going more than 10 points every 24 hours to prevent osmotic demyelination. Management of UTI per the primary team.   -- Milon Dikes, MD [facility] Pager: [phone] If 7pm to 7am, please call on call as listed on AMION.

## 2020-06-21 DIAGNOSIS — B964 Proteus (mirabilis) (morganii) as the cause of diseases classified elsewhere: Secondary | ICD-10-CM

## 2020-06-21 DIAGNOSIS — B961 Klebsiella pneumoniae [K. pneumoniae] as the cause of diseases classified elsewhere: Secondary | ICD-10-CM

## 2020-06-21 LAB — BASIC METABOLIC PANEL
Anion gap: 13 (ref 5–15)
Anion gap: 13 (ref 5–15)
Anion gap: 12 (ref 5–15)
Anion gap: 15 (ref 5–15)
BUN: 27 mg/dL — ABNORMAL HIGH (ref 8–23)
BUN: 28 mg/dL — ABNORMAL HIGH (ref 8–23)
BUN: 26 mg/dL — ABNORMAL HIGH (ref 8–23)
BUN: 25 mg/dL — ABNORMAL HIGH (ref 8–23)
CO2: 23 mmol/L (ref 22–32)
CO2: 22 mmol/L (ref 22–32)
CO2: 23 mmol/L (ref 22–32)
CO2: 23 mmol/L (ref 22–32)
Calcium: 8 mg/dL — ABNORMAL LOW (ref 8.9–10.3)
Calcium: 8.2 mg/dL — ABNORMAL LOW (ref 8.9–10.3)
Calcium: 8.3 mg/dL — ABNORMAL LOW (ref 8.9–10.3)
Calcium: 8.4 mg/dL — ABNORMAL LOW (ref 8.9–10.3)
Chloride: 88 mmol/L — ABNORMAL LOW (ref 98–111)
Chloride: 91 mmol/L — ABNORMAL LOW (ref 98–111)
Chloride: 92 mmol/L — ABNORMAL LOW (ref 98–111)
Chloride: 92 mmol/L — ABNORMAL LOW (ref 98–111)
Creatinine, Ser: 2.6 mg/dL — ABNORMAL HIGH (ref 0.44–1.00)
Creatinine, Ser: 2.69 mg/dL — ABNORMAL HIGH (ref 0.44–1.00)
Creatinine, Ser: 2.56 mg/dL — ABNORMAL HIGH (ref 0.44–1.00)
Creatinine, Ser: 2.26 mg/dL — ABNORMAL HIGH (ref 0.44–1.00)
GFR calc Af Amer: 19 mL/min — ABNORMAL LOW (ref >60)
GFR calc Af Amer: 18 mL/min — ABNORMAL LOW (ref >60)
GFR calc Af Amer: 20 mL/min — ABNORMAL LOW (ref >60)
GFR calc Af Amer: 23 mL/min — ABNORMAL LOW (ref >60)
GFR calc non Af Amer: 17 mL/min — ABNORMAL LOW (ref >60)
GFR calc non Af Amer: 16 mL/min — ABNORMAL LOW (ref >60)
GFR calc non Af Amer: 17 mL/min — ABNORMAL LOW (ref >60)
GFR calc non Af Amer: 20 mL/min — ABNORMAL LOW (ref >60)
Glucose, Bld: 121 mg/dL — ABNORMAL HIGH (ref 70–99)
Glucose, Bld: 113 mg/dL — ABNORMAL HIGH (ref 70–99)
Glucose, Bld: 120 mg/dL — ABNORMAL HIGH (ref 70–99)
Glucose, Bld: 136 mg/dL — ABNORMAL HIGH (ref 70–99)
Potassium: 3.3 mmol/L — ABNORMAL LOW (ref 3.5–5.1)
Potassium: 3.4 mmol/L — ABNORMAL LOW (ref 3.5–5.1)
Potassium: 3.3 mmol/L — ABNORMAL LOW (ref 3.5–5.1)
Potassium: 3.3 mmol/L — ABNORMAL LOW (ref 3.5–5.1)
Sodium: 124 mmol/L — ABNORMAL LOW (ref 135–145)
Sodium: 126 mmol/L — ABNORMAL LOW (ref 135–145)
Sodium: 127 mmol/L — ABNORMAL LOW (ref 135–145)
Sodium: 130 mmol/L — ABNORMAL LOW (ref 135–145)

## 2020-06-21 LAB — HEPATITIS C ANTIBODY: HCV Ab: NONREACTIVE

## 2020-06-21 LAB — CBC
HCT: 36.1 % (ref 36.0–46.0)
Hemoglobin: 12.1 g/dL (ref 12.0–15.0)
MCH: 27 pg (ref 26.0–34.0)
MCHC: 33.5 g/dL (ref 30.0–36.0)
MCV: 80.6 fL (ref 80.0–100.0)
Platelets: 221 10*3/uL (ref 150–400)
RBC: 4.48 MIL/uL (ref 3.87–5.11)
RDW: 13.8 % (ref 11.5–15.5)
WBC: 13.9 10*3/uL — ABNORMAL HIGH (ref 4.0–10.5)
nRBC: 0 % (ref 0.0–0.2)

## 2020-06-21 LAB — PROTIME-INR
INR: 1 (ref 0.8–1.2)
Prothrombin Time: 12.9 seconds (ref 11.4–15.2)

## 2020-06-21 LAB — GLUCOSE, CAPILLARY: Glucose-Capillary: 140 mg/dL — ABNORMAL HIGH (ref 70–99)

## 2020-06-21 MED ORDER — LACTATED RINGERS IV SOLN: INTRAVENOUS | Status: AC

## 2020-06-21 MED ORDER — ENOXAPARIN SODIUM 30 MG/0.3ML ~~LOC~~ SOLN
30.0000 mg | SUBCUTANEOUS | Status: DC
  Administered 2020-06-21: 30 mg via SUBCUTANEOUS
  Filled 2020-06-21: qty 0.3

## 2020-06-21 MED ORDER — METOPROLOL SUCCINATE ER 100 MG PO TB24
100.0000 mg | ORAL_TABLET | Freq: Every day | ORAL | Status: DC
  Administered 2020-06-21 – 2020-07-01 (×10): 100 mg via ORAL
  Filled 2020-06-21 (×11): qty 1

## 2020-06-21 MED ORDER — POTASSIUM CHLORIDE CRYS ER 20 MEQ PO TBCR
40.0000 meq | EXTENDED_RELEASE_TABLET | Freq: Once | ORAL | Status: AC
  Administered 2020-06-22: 40 meq via ORAL
  Filled 2020-06-21: qty 2

## 2020-06-21 MED ORDER — AMOXICILLIN-POT CLAVULANATE 500-125 MG PO TABS
1.0000 | ORAL_TABLET | Freq: Two times a day (BID) | ORAL | Status: DC
  Administered 2020-06-21 (×2): 500 mg via ORAL
  Filled 2020-06-21 (×4): qty 1

## 2020-06-21 MED ORDER — INFLUENZA VAC A&B SA ADJ QUAD 0.5 ML IM PRSY
0.5000 mL | PREFILLED_SYRINGE | INTRAMUSCULAR | Status: AC
  Administered 2020-06-22: 0.5 mL via INTRAMUSCULAR
  Filled 2020-06-21: qty 0.5

## 2020-06-21 MED ORDER — DEXTROSE 5 % IV SOLN
800.0000 mg | INTRAVENOUS | Status: DC
  Administered 2020-06-22 – 2020-06-23 (×2): 800 mg via INTRAVENOUS
  Filled 2020-06-21 (×2): qty 16

## 2020-06-21 MED ORDER — AMLODIPINE BESYLATE 5 MG PO TABS
5.0000 mg | ORAL_TABLET | Freq: Every day | ORAL | Status: DC
  Administered 2020-06-21: 5 mg via ORAL
  Filled 2020-06-21: qty 1

## 2020-06-21 NOTE — Progress Notes (Signed)
MD made aware of BP being high.

## 2020-06-21 NOTE — Evaluation (Signed)
Clinical/Bedside Swallow Evaluation Patient Details  Name: Alyssa Hudson MRN: [id number] Date of Birth: 1941-09-10  Today's Date: 06/21/2020 Time: SLP Start Time (ACUTE ONLY): 0911 SLP Stop Time (ACUTE ONLY): 3329 SLP Time Calculation (min) (ACUTE ONLY): 11 min  Past Medical History:  Past Medical History:  Diagnosis Date  . Facial droop   . Hypertension    Past Surgical History: unknown HPI:  Pt is an 79 year old woman with a history of HTN, recurrent shingles, and left facial nerve palsy presenting with with acute encephalopathy after a possible seizure event at home following a week of vomiting and abdominal pain with headache in the context of ongoing shingles.  CXR and MRI 9/24 negative for acute findings.   Assessment / Plan / Recommendation Clinical Impression  Pt presents with functional swallowing ability as assessed clinically.  Pt tolerated all consistencies trialed without any clinical s/s of aspiration and exhibited good oral clearance of solids. Pt has decreased facial movement on L side following Bell's Palsy.  No anterior loss of liquid noted with straw.  Pt has upper denture in place and lower partial which was not in place.  Pt and son reported some coughing with english muffin this morning 2/2 missing partial plate, but no recurring difficulties.  Pt may need some assistance with meals given confusion, but was able to feed herself and demonstrated simple spatial problem solving skills by independently reorienting cracker to make it easier to bite.   Recommend continuing regular texture diet with thin liquids.    If pt does not return to cognitive baseline with resolution of AMS, consider cognitive evaluation. SLP will sign off at this time. SLP Visit Diagnosis: Dysphagia, unspecified (R13.10)    Aspiration Risk  No limitations    Diet Recommendation Regular;Thin liquid   Liquid Administration via: Straw Medication Administration: Whole meds with  liquid Supervision: Staff to assist with self feeding Compensations: Slow rate;Small sips/bites Postural Changes: Seated upright at 90 degrees    Other  Recommendations Oral Care Recommendations: Oral care BID   Follow up Recommendations None      Frequency and Duration  (N/A)          Prognosis Prognosis for Safe Diet Advancement:  (N/A)      Swallow Study   General Date of Onset: 06/19/20 HPI: Pt is an 79 year old woman with a history of HTN, recurrent shingles, and left facial nerve palsy presenting with with acute encephalopathy after a possible seizure event at home following a week of vomiting and abdominal pain with headache in the context of ongoing shingles.  CXR and MRI 9/24 negative for acute findings. Type of Study: Bedside Swallow Evaluation Previous Swallow Assessment: None Diet Prior to this Study: Regular Temperature Spikes Noted: No Respiratory Status: Room air History of Recent Intubation: No Behavior/Cognition: Alert;Confused Oral Cavity Assessment: Within Functional Limits Oral Care Completed by SLP: No Oral Cavity - Dentition: Dentures, top;Missing dentition Vision: Functional for self-feeding Self-Feeding Abilities: Able to feed self;Needs assist Patient Positioning: Upright in bed Baseline Vocal Quality: Normal    Oral/Motor/Sensory Function Overall Oral Motor/Sensory Function: Mild impairment Facial ROM: Reduced left Facial Symmetry: Abnormal symmetry left Lingual ROM: Within Functional Limits Lingual Symmetry: Within Functional Limits Lingual Strength: Within Functional Limits Velum: Within Functional Limits Mandible: Within Functional Limits   Ice Chips Ice chips: Not tested   Thin Liquid Thin Liquid: Within functional limits Presentation: Straw    Nectar Thick Nectar Thick Liquid: Not tested   Honey Thick Honey Thick Liquid: Not tested  Puree Puree: Within functional limits Presentation: Spoon   Solid     Solid: Within functional  limits Presentation: Self Fed      Kerrie Pleasure , MA, CCC-SLP Acute Rehabilitation Services Office: [phone]; Pager (9/26): [phone] 06/21/2020,9:42 AM

## 2020-06-21 NOTE — Progress Notes (Signed)
Neurology Progress Note   S:// Seen and examined.   O:// Current vital signs: BP (!) 186/73 (BP Location: Right Arm)   Pulse 97   Temp 98.2 F (36.8 C) (Oral)   Resp 20   Ht 5\' 5"  (1.651 m)   Wt 81.6 kg   SpO2 97%   BMI 29.95 kg/m  Vital signs in last 24 hours: Temp:  [98 F (36.7 C)-98.4 F (36.9 C)] 98.2 F (36.8 C) (09/26 0745) Pulse Rate:  [84-97] 97 (09/26 0745) Resp:  [17-20] 20 (09/26 0745) BP: (144-186)/(72-78) 186/73 (09/26 0745) SpO2:  [95 %-97 %] 97 % (09/26 0745) Neurological exam She is awake, alert, oriented to self. She was able to tell me she is in the hospital, although round hospital name. She is not able to tell me the correct month or date Her speech is nondysarthric She has inappropriate laughter in between conversation. Poor attention concentration Cranial nerves: Pupils equal round react light, extraocular movements intact, visual field full, face asymmetric with left-sided droop that is baseline, decreased auditory acuity bilaterally, tongue and palate are midline. Motor exam: Can move all 4 extremities antigravity Sensory exam is normal Coordination: Difficult to elicit but no obvious dysmetria  Medications  Current Facility-Administered Medications:  .  0.9 %  sodium chloride infusion, , Intravenous, PRN, 09-25-1989, MD, Last Rate: 10 mL/hr at 06/20/20 2340, 1,000 mL at 06/20/20 2340 .  acetaminophen (TYLENOL) tablet 650 mg, 650 mg, Oral, Q6H PRN **OR** acetaminophen (TYLENOL) suppository 650 mg, 650 mg, Rectal, Q6H PRN, Masoudi, Elhamalsadat, MD .  06/22/20 ON 06/22/2020] acyclovir (ZOVIRAX) 800 mg in dextrose 5 % 150 mL IVPB, 800 mg, Intravenous, Q24H, Tucker, Mary-Ashlyn, RPH .  amoxicillin-clavulanate (AUGMENTIN) 500-125 MG per tablet 500 mg, 1 tablet, Oral, Q12H, Masoudi, Elhamalsadat, MD, 500 mg at 06/21/20 1018 .  doxycycline (VIBRAMYCIN) 100 mg in sodium chloride 0.9 % 250 mL IVPB, 100 mg, Intravenous, Q12H, 06/23/20, MD,  Last Rate: 125 mL/hr at 06/21/20 0750, 100 mg at 06/21/20 0750 .  enoxaparin (LOVENOX) injection 30 mg, 30 mg, Subcutaneous, Q24H, Masoudi, Elhamalsadat, MD .  lactated ringers infusion, , Intravenous, Continuous, Masoudi, Elhamalsadat, MD, Last Rate: 100 mL/hr at 06/21/20 1144, New Bag at 06/21/20 1144 .  levETIRAcetam (KEPPRA) IVPB 500 mg/100 mL premix, 500 mg, Intravenous, Q12H, 06/23/20, MD, Last Rate: 400 mL/hr at 06/21/20 1016, 500 mg at 06/21/20 1016 .  lidocaine (PF) (XYLOCAINE) 1 % injection 5 mL, 5 mL, Other, Once, Curatolo, Adam, DO .  ondansetron (ZOFRAN) tablet 4 mg, 4 mg, Oral, Q6H PRN **OR** ondansetron (ZOFRAN) injection 4 mg, 4 mg, Intravenous, Q6H PRN, Masoudi, Elhamalsadat, MD Labs CBC    Component Value Date/Time   WBC 13.9 (H) 06/21/2020 0557   RBC 4.48 06/21/2020 0557   HGB 12.1 06/21/2020 0557   HCT 36.1 06/21/2020 0557   PLT 221 06/21/2020 0557   MCV 80.6 06/21/2020 0557   MCH 27.0 06/21/2020 0557   MCHC 33.5 06/21/2020 0557   RDW 13.8 06/21/2020 0557   LYMPHSABS 2.1 06/19/2020 1356   MONOABS 3.1 (H) 06/19/2020 1356   EOSABS 0.1 06/19/2020 1356   BASOSABS 0.1 06/19/2020 1356    CMP     Component Value Date/Time   NA 126 (L) 06/21/2020 0557   K 3.4 (L) 06/21/2020 0557   CL 91 (L) 06/21/2020 0557   CO2 22 06/21/2020 0557   GLUCOSE 113 (H) 06/21/2020 0557   BUN 28 (H) 06/21/2020 0557   CREATININE 2.69 (  H) 06/21/2020 0557   CALCIUM 8.2 (L) 06/21/2020 0557   PROT 5.8 (L) 06/20/2020 0322   ALBUMIN 2.8 (L) 06/20/2020 0322   AST 16 06/20/2020 0322   ALT 16 06/20/2020 0322   ALKPHOS 77 06/20/2020 0322   BILITOT 0.8 06/20/2020 0322   GFRNONAA 16 (L) 06/21/2020 0557   GFRAA 18 (L) 06/21/2020 0557   Labs reveal significant hyponatremia and significant increase in creatinine over baseline.  Imaging I have reviewed images in epic and the results pertinent to this consultation are: MRI brain with no acute changes.  Assessment: 79 year old past history  of hypertension, shingles, known left facial droop, evaluated for altered mental status-also brought in with concern for left-sided weakness that has resolved. Remains encephalopathic and at times exhibits inappropriate laughter, on exam has inability to perform tasks that require adequate attention concentration. Labs yesterday revealed hyponatremia.  Urinalysis concerning for UTI. Recent shingles and sudden onset of confusion always makes VZV encephalitis as a concern. On top of that today, her renal function is deranged much worse than her baseline with a creatinine of 2.69. I suspect that her symptoms are related to multifactorial toxic metabolic encephalopathy from hyponatremia, deranged renal function as well as UTI.  That said, VZV encephalitis always remains a concern in a patient her age and with her medical history.  Spinal tap was attempted twice in IR and once at bedside and failed due to her inability to follow instructions and stay still. I would empirically treat her for viral encephalitides in a normal situation but given her deranged renal function and her being on acyclovir, it is imperative that LP be performed-can request LP under sedation-anesthesia/IR, so that if the CSF is negative for HSV or VZV, acyclovir can be discontinued.   Impression: Multifactorial toxic metabolic encephalopathy-deranged renal function, UTI, hyponatremia Evaluate for viral encephalitis  Recommendations: I would recommend aggressive treatment for the metabolic derangements including that of the deranged renal function, UTI and hyponatremia with caution of not correcting more than 10 points of sodium every day to avoid osmotic demyelination.  I would recommend getting a spinal tap done under sedation so that viral PCR's including that for HSV and VZV can be performed-if positive, can continue acyclovir otherwise that can be discontinued.  That would also help with renal function recovery.  Supportive  treatment per primary team as you are.  I had a detailed conversation regarding this case with the primary team resident Dr. Maryla Morrow he over the phone and have relayed the plan via securechat to the resident doctor and attending.  -- Milon Dikes, MD [facility] Pager: [phone] If 7pm to 7am, please call on call as listed on AMION.

## 2020-06-21 NOTE — Progress Notes (Signed)
Pharmacy Antibiotic Note  Alyssa Hudson is a 79 y.o. female admitted on 06/19/2020 presenting as code stroke, known shingles infection and concern for related encephalopathy.  Pharmacy has been consulted for acyclovir dosing.  Pt not following commands at this time and not a good PO candidate for tx.    Will dose 10mg /kg based on actual body weight since BMI <30.   LP was performed 9/24 but CSF was not obtained. MRI showed no acute intracranial abnormality.  Plan to continue acyclovir until successful LP.  Plan: Acyclovir 800 mg IV changed to every 24 hours based on CrCl 17.3 mL/min  Monitor renal function, encephalopathy workup, ability to transition to PO  Height: 5\' 5"  (165.1 cm) Weight: 81.6 kg (180 lb) IBW/kg (Calculated) : 57  Temp (24hrs), Avg:98.1 F (36.7 C), Min:97.4 F (36.3 C), Max:98.4 F (36.9 C)  Recent Labs  Lab 06/19/20 1356 06/19/20 2243 06/20/20 0322 06/21/20 0106 06/21/20 0557  WBC 18.1*  --  16.4*  --  13.9*  CREATININE 0.72 0.95 1.35* 2.60* 2.69*    Estimated Creatinine Clearance: 17.3 mL/min (A) (by C-G formula based on SCr of 2.69 mg/dL (H)).    Allergies  Allergen Reactions  . Codeine Nausea Only and Nausea And Vomiting    Insomnia  . Contrast Media [Iodinated Diagnostic Agents] Other (See Comments)    Turned red all over  . Iodine-131 Hives and Swelling  . Sulfa Antibiotics Nausea Only    06/23/20, PharmD PGY-1 Acute Care Pharmacy Resident Please refer to [facility] for unit-specific pharmacist 06/21/2020 7:45 AM

## 2020-06-21 NOTE — Progress Notes (Addendum)
Subjective:   Alyssa Hudson was evaluated at bedside this morning, there were no acute events overnight. Patient is still very confused, but denies headaches, blurry vision, chest pain, shortness of breath, dysuria, constipation, diarrhea, fevers, chills. Her son was in the room during the interview, and reports she has become more confused since yesterday. He reports she is very far from her baseline.   Objective:  Vital signs in last 24 hours: Vitals:   06/20/20 2313 06/21/20 0326 06/21/20 0745 06/21/20 1253  BP: (!) 165/74 (!) 160/78 (!) 186/73 (!) 183/74  Pulse: 94 89 97 100  Resp: 17 18 20  (!) 22  Temp: 98 F (36.7 C) 98.2 F (36.8 C) 98.2 F (36.8 C) 98.2 F (36.8 C)  TempSrc:  Oral Oral Oral  SpO2:  96% 97% 96%  Weight:      Height:       PHYSICAL EXAMINATION:  General Appearance: The patient is a well-developed, well-nourished, in no acute distress.  HEENT: Extraocular muscles are intact. Pupils are round and reactive to light. Conjunctivae are pink and moist. Sclerae are white and nonicteric.  Mouth: Oral mucosa is pink and moist. Dentition is good.  Neck: Supple. Trachea is midline. There is no jugular venous distention noted.  Lungs: Clear to auscultation bilaterally. There are no crackles, wheezes or rhonchi noted. There is no crepitus on palpation.  Heart: Regular rate and rhythm, S1/S2. No murmurs are noted. There are no lifts, heaves or thrills noted on palpation.  Abdomen: Soft and nontender. There are good bowel sounds. There is no rebound or guarding.   Skin: There are no rashes, lesions or ulcers noted. Warm and dry with good turgor.  MSK: There is no clubbing, cyanosis or edema. Motor and strength is appropriate in upper and lower extremities.  Neuro: Cranial nerves II-VI, and VIII-XII are grossly intact. Left facial droop, chronic. Reported Bell's palsy.     Psych: The patient is oriented to person and place but not time. Her delirium appears more  active/agitated today.    Assessment/Plan:  Principal Problem:   Encephalopathy Active Problems:   Hyponatremia  # Acute Encephalopathy Patient developed seizure-like symptoms acutely the morning of 06/19/20 following severe headache and abdominal pain, in the setting of acute shingles infection. Differential is wide and includes seizure/post-ictal state, VZV encephalitis, tick-borne illness, other infectious cause, delirium in setting of possible UTI vs. Pyelonephritis, diverticulitis, hyponatremia-induced vs. medication induced (on high dose gabapentin at home). Husband states that at baseline she is oriented and independent in ADL's. CT head without contrast showed small vessel disease and an equivocal asymmetric increased density of the Right MCA, without acute disease process.06/21/20 MRI brain without contrast showed chronic lacunar infarcts vs. perivascular spaces in the bilateral BG and chronic lacunar infarct of the right caudate with tiny infarcts of bilateral cerebellar hemispheres, but no acute intracranial abnormality. EEG showed cortical dysfunction of the R temporal region and moderate diffuse encephalopathy possibly consistent with post-ictal state. 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 Patient is on HCTZ 25mg  and Losartan 100mg  daily at home. Patient appears hypervolemic with 1+ bilateral pitting edema and JVD. Creatinine and BUN uptrending. Likely renal component,  including SIADH and/or RAAS abnormality / medication effect. Also consider cirrhosis / CHF. No ECHO on file although CT abdomen/pelvis showed  possible cirrhosis, mild hepatic steatosis.  - Patient passed bedside swallow evaluation  - Started on LR @ 100 ml/hr for severe AKI on acyclovir, anticipating worsening hyponatremia, may switch to 1/2 NS.   - Monitor BMP q6 hours for sodium correction, goal < 8 point correction per 24 hours   # AKI BUN on admission was 14, now 27. Cr was 0.72, now 2.6. Possibly 2/2 acyclovir, but due to the need for coverage of VZV and HSV encephalitis, we will continue the Acyclovir 800mg  q24.  -Start LR @ 100 ml/hr -Repeat BMP in 4 hours  # Active Varicella Zoster Infection Patient's husband states patient was diagnosed with shingles in May of this year, which resolved, but developed recurrence along her left breast/axillary region x 3 weeks. Patient had been in severe pain but had stopped taking her home Valtrex with 4 remaining doses prior to admission. May be contributing factor to encephalitis. - Patient started on empirical Acyclovir until LP able to be obtained - Patient is s/p many unsuccessful LP attempts due to agitation / movement  - Monitor for improvement in rash and mental status   # UTI with possible pyelonephritis U/A showed cloudy urine with large leukocytes, negative nitrites, small hemoglobinuria, no bacteria, >50 WBC, 11-20 RBC and mucus. Triple phosphate crystals present. CT abdomen/pelvis without contrast showed perinephric edema but no nephrolithiasis. Bladder distended with air-fluid level. Patient afebrile, initial WBC 18.1. Denies associated symptoms at this time.  - Urine culture grew Klebsiella Pneumoniae and Proteus Vulgaris  - Started empiric treatment with Augmentin 875mg  q12h x 10-14 days, due to renal function pharmacy changed dose to 500mg  q12h  - Will check bladder scan q4 hours with I&O cath if >300cc's    # Diverticulosis with possible acute Diverticulitis  Patient has had nausea, vomiting, decreased PO intake, and intermittent severe abdominal pain per patient's husband  with benign physical exam aside from moderate abdominal distention. Patient afebrile. CT abdomen pelvis showed colonic diverticulosis with mild pericolonic fat stranding involving the junction of the descending and sigmoid colon in the region of multiple diverticula, suspicious for acute diverticulitis without perforation or abscess.  - Augmentin 500mg  q12 hrs  - Continue to monitor daily CBC, vitals   # Hepatomegaly with Borderline Hepatic Steatosis and Possible Cirrhosis  Husband notes no alcohol history, ETOH < 10, urine tox negative. HIV negative, no known history of cirrhosis. Likely due to NASH given HTN, pre-diabetes, patient is overweight. Albumin 3.1, no thrombocytopenia. - Will check HCV Antibodies  - Will check PT/INR   # Mild Splenomegaly Platelets 271 without thrombocytopenia. WBC 18.1 with neutrophil predominance, though monocyte count 3.1. Unclear etiology. - No follow up at this time   # History of HTN Patient takes HCTZ 25mg , Losartan 100mg , and Metoprolol succinate 100mg  daily. It is unclear if patient has been taking her amlodipine 5mg  daily at home. Initial pressure 166/79, now 184/74.  - Start Norvasc 5 qD and home metoprolol 100mg  PO qD -Holding HCTZ and Losartan due to possible hyponatremia and AKI respectively - Patient may benefit outpatient from lisinopril if microalbuminuria continues   # Pre-Diabetes Hgb A1c 6.1 this admission. Patient not on any glucose lowering agents with no known hx DM.  - Sugars may be elevated in recent inflammation  - Continue to monitor daily BG     Prior to Admission Living Arrangement: Home Anticipated  Discharge Location: Home vs. SNF Barriers to Discharge: Acute encephalopathy, possible infectious etiologies   Alyssa Hudson, Medical Student 06/21/2020, 2:48 PM Pager: [phone] After 5pm on weekdays and 1pm on weekends: On Call pager [phone]

## 2020-06-21 NOTE — Progress Notes (Signed)
PHARMACY NOTE:  ANTIMICROBIAL RENAL DOSAGE ADJUSTMENT  Current antimicrobial regimen includes a mismatch between antimicrobial dosage and estimated renal function.  As per policy approved by the Pharmacy & Therapeutics and Medical Executive Committees, the antimicrobial dosage will be adjusted accordingly.  Current antimicrobial dosage:  Augmentin 875 mg q12h   Indication: diverticulosis  Renal Function:  Estimated Creatinine Clearance: 17.3 mL/min (A) (by C-G formula based on SCr of 2.69 mg/dL (H)).    Antimicrobial dosage has been changed to:  Augmentin 500mg  q12h  Additional comments: will continue to monitor clinical progress, LOT, and renal function   Thank you for allowing pharmacy to be a part of this patient's care.  , PharmD PGY-1 Acute Care Pharmacy Resident 06/21/2020 9:04 AM

## 2020-06-22 LAB — BASIC METABOLIC PANEL
Anion gap: 11 (ref 5–15)
Anion gap: 8 (ref 5–15)
Anion gap: 12 (ref 5–15)
Anion gap: 11 (ref 5–15)
BUN: 23 mg/dL (ref 8–23)
BUN: 22 mg/dL (ref 8–23)
BUN: 20 mg/dL (ref 8–23)
BUN: 21 mg/dL (ref 8–23)
CO2: 24 mmol/L (ref 22–32)
CO2: 26 mmol/L (ref 22–32)
CO2: 25 mmol/L (ref 22–32)
CO2: 26 mmol/L (ref 22–32)
Calcium: 8.8 mg/dL — ABNORMAL LOW (ref 8.9–10.3)
Calcium: 8.8 mg/dL — ABNORMAL LOW (ref 8.9–10.3)
Calcium: 8.7 mg/dL — ABNORMAL LOW (ref 8.9–10.3)
Calcium: 8.7 mg/dL — ABNORMAL LOW (ref 8.9–10.3)
Chloride: 97 mmol/L — ABNORMAL LOW (ref 98–111)
Chloride: 98 mmol/L (ref 98–111)
Chloride: 97 mmol/L — ABNORMAL LOW (ref 98–111)
Chloride: 99 mmol/L (ref 98–111)
Creatinine, Ser: 1.98 mg/dL — ABNORMAL HIGH (ref 0.44–1.00)
Creatinine, Ser: 1.82 mg/dL — ABNORMAL HIGH (ref 0.44–1.00)
Creatinine, Ser: 1.7 mg/dL — ABNORMAL HIGH (ref 0.44–1.00)
Creatinine, Ser: 1.62 mg/dL — ABNORMAL HIGH (ref 0.44–1.00)
GFR calc Af Amer: 27 mL/min — ABNORMAL LOW (ref >60)
GFR calc Af Amer: 30 mL/min — ABNORMAL LOW (ref >60)
GFR calc Af Amer: 32 mL/min — ABNORMAL LOW (ref >60)
GFR calc Af Amer: 34 mL/min — ABNORMAL LOW (ref >60)
GFR calc non Af Amer: 23 mL/min — ABNORMAL LOW (ref >60)
GFR calc non Af Amer: 26 mL/min — ABNORMAL LOW (ref >60)
GFR calc non Af Amer: 28 mL/min — ABNORMAL LOW (ref >60)
GFR calc non Af Amer: 29 mL/min — ABNORMAL LOW (ref >60)
Glucose, Bld: 123 mg/dL — ABNORMAL HIGH (ref 70–99)
Glucose, Bld: 157 mg/dL — ABNORMAL HIGH (ref 70–99)
Glucose, Bld: 116 mg/dL — ABNORMAL HIGH (ref 70–99)
Glucose, Bld: 149 mg/dL — ABNORMAL HIGH (ref 70–99)
Potassium: 3.6 mmol/L (ref 3.5–5.1)
Potassium: 3.7 mmol/L (ref 3.5–5.1)
Potassium: 3.5 mmol/L (ref 3.5–5.1)
Potassium: 3.3 mmol/L — ABNORMAL LOW (ref 3.5–5.1)
Sodium: 132 mmol/L — ABNORMAL LOW (ref 135–145)
Sodium: 132 mmol/L — ABNORMAL LOW (ref 135–145)
Sodium: 134 mmol/L — ABNORMAL LOW (ref 135–145)
Sodium: 136 mmol/L (ref 135–145)

## 2020-06-22 LAB — CBC
HCT: 38.3 % (ref 36.0–46.0)
Hemoglobin: 12.6 g/dL (ref 12.0–15.0)
MCH: 26.6 pg (ref 26.0–34.0)
MCHC: 32.9 g/dL (ref 30.0–36.0)
MCV: 81 fL (ref 80.0–100.0)
Platelets: 272 10*3/uL (ref 150–400)
RBC: 4.73 MIL/uL (ref 3.87–5.11)
RDW: 14 % (ref 11.5–15.5)
WBC: 16.9 10*3/uL — ABNORMAL HIGH (ref 4.0–10.5)
nRBC: 0 % (ref 0.0–0.2)

## 2020-06-22 LAB — URINE CULTURE: Culture: >=100000 — AB

## 2020-06-22 LAB — VARICELLA-ZOSTER BY PCR: Varicella-Zoster, PCR: NEGATIVE

## 2020-06-22 LAB — PHOSPHORUS: Phosphorus: 3.7 mg/dL (ref 2.5–4.6)

## 2020-06-22 LAB — VARICELLA ZOSTER ANTIBODY, IGM: Varicella-Zoster Ab, IgM: 6.22 index — ABNORMAL HIGH (ref 0.00–0.90)

## 2020-06-22 LAB — MAGNESIUM: Magnesium: 1.4 mg/dL — ABNORMAL LOW (ref 1.7–2.4)

## 2020-06-22 MED ORDER — METRONIDAZOLE 500 MG PO TABS
500.0000 mg | ORAL_TABLET | Freq: Three times a day (TID) | ORAL | Status: DC
  Administered 2020-06-22 – 2020-06-24 (×5): 500 mg via ORAL
  Filled 2020-06-22 (×5): qty 1

## 2020-06-22 MED ORDER — LIDOCAINE 5 % EX PTCH
2.0000 | MEDICATED_PATCH | CUTANEOUS | Status: DC
  Administered 2020-06-22 – 2020-07-01 (×10): 2 via TRANSDERMAL
  Filled 2020-06-22 (×10): qty 2

## 2020-06-22 MED ORDER — AMLODIPINE BESYLATE 10 MG PO TABS
10.0000 mg | ORAL_TABLET | Freq: Every day | ORAL | Status: DC
  Administered 2020-06-24 – 2020-07-01 (×8): 10 mg via ORAL
  Filled 2020-06-22 (×9): qty 1

## 2020-06-22 MED ORDER — LACTATED RINGERS IV SOLN: INTRAVENOUS | Status: AC

## 2020-06-22 MED ORDER — POTASSIUM CHLORIDE CRYS ER 20 MEQ PO TBCR
40.0000 meq | EXTENDED_RELEASE_TABLET | Freq: Once | ORAL | Status: AC
  Administered 2020-06-22: 40 meq via ORAL
  Filled 2020-06-22: qty 2

## 2020-06-22 MED ORDER — ENOXAPARIN SODIUM 30 MG/0.3ML ~~LOC~~ SOLN: 30.0000 mg | SUBCUTANEOUS | Status: DC

## 2020-06-22 MED ORDER — MAGNESIUM SULFATE 4 GM/100ML IV SOLN
4.0000 g | Freq: Once | INTRAVENOUS | Status: AC
  Administered 2020-06-22: 4 g via INTRAVENOUS
  Filled 2020-06-22: qty 100

## 2020-06-22 MED ORDER — SODIUM CHLORIDE 0.9 % IV SOLN
1.0000 g | INTRAVENOUS | Status: DC
  Administered 2020-06-22 – 2020-06-23 (×2): 1 g via INTRAVENOUS
  Filled 2020-06-22 (×2): qty 1

## 2020-06-22 NOTE — Progress Notes (Addendum)
Subjective:   Alyssa Hudson was evaluated at bedside this morning, there were no acute events overnight. Appears mildly agitated, but very pleasant. Per husband, she slept for a few hours last night. Complains of back pain and some diarrhea which she attributes to the Abx. Complains also of shingles on her back and left axilla. She reports some chronic urinary incontinence which has worsened slightly since being in the hospital. Denies CP, SOB, dysuria. Her husband feels she is slightly more herself today.   Objective:  Vital signs in last 24 hours: Vitals:   06/21/20 1548 06/21/20 1936 06/21/20 2334 06/22/20 0753  BP: (!) 185/80 (!) 144/76 (!) 168/68 (!) 179/75  Pulse: (!) 102 86 85 87  Resp: 20 20 20 18   Temp: 97.9 F (36.6 C) 98.4 F (36.9 C) 98.5 F (36.9 C) 98.5 F (36.9 C)  TempSrc: Oral Oral Oral Oral  SpO2: 96% 97% 97% 96%  Weight:      Height:       PHYSICAL EXAMINATION:  General Appearance: The patient is a well-developed, well-nourished, in no acute distress.  HEENT: Extraocular movements are intact. Pupils are round and reactive to light. Conjunctivae are pink and moist. Sclerae are white and nonicteric.  Mouth: Oral mucosa is pink and moist. Dentition is good.  Neck: Supple. Trachea is midline. There is no jugular venous distention noted.  Lungs: Clear to auscultation bilaterally. There are no crackles, wheezes or rhonchi noted. There is no crepitus on palpation.  Heart: Regular rate and rhythm, S1/S2. No murmurs are noted. There are no lifts, heaves or thrills noted on palpation.  Abdomen: Mild tenderness in RLQ. There are good bowel sounds. There is no rebound or guarding.   Skin: Vesicular rash over left axilla consistent with varicella zoster. Warm and dry with good turgor.  MSK: There is no clubbing, cyanosis or edema. Motor and strength is 5/5 in upper and lower extremities. 5/5 strength intrinsic hand muscles bilaterally  Neuro: Cranial nerves II-VI, and  VIII-XII are grossly intact. Left facial droop, chronic. Sensation is equal and intact in BLE  Psych: The patient is oriented to person and place but not time. Knows who the president is. Is able to correctly subtract 7 from 100.    Assessment/Plan:  Principal Problem:   Encephalopathy Active Problems:   Hyponatremia  # Acute Encephalopathy Patient developed seizure-like symptoms acutely the morning of 06/19/20 following severe headache and abdominal pain, in the setting of acute shingles infection. Differential is wide and includes seizure/post-ictal state, VZV encephalitis, tick-borne illness, other infectious cause, delirium in setting of possible UTI vs. Pyelonephritis, diverticulitis, hyponatremia-induced vs. medication induced (on high dose gabapentin at home). Husband states that at baseline she is oriented and independent in ADL's. CT head without contrast showed small vessel disease and an equivocal asymmetric increased density of the Right MCA, without acute disease process.06/21/20 MRI brain without contrast showed chronic lacunar infarcts vs. perivascular spaces in the bilateral BG and chronic lacunar infarct of the right caudate with tiny infarcts of bilateral cerebellar hemispheres, but no acute intracranial abnormality. EEG showed cortical dysfunction of the R temporal region and moderate diffuse encephalopathy possibly consistent with post-ictal state. 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 Patient is on HCTZ 25mg  and Losartan 100mg  daily at home.  Patient appears euvolemic today. Creatinine and BUN downtrending. Likely multifactorial with SIADH and medication effect. Also consider cirrhosis / CHF. No ECHO on file although CT abdomen/pelvis showed possible cirrhosis, mild hepatic steatosis.  - Patient passed bedside swallow evaluation, on regular diet - Started on LR @ 100 ml/hr for severe AKI on acyclovir, hyponatremia improved, stable at 132 this morning -continue LR as above - Monitor BMP q6 hours for sodium correction, goal < 8 point correction per 24 hours   # AKI BUN on admission was 14, trended up to 27. Cr was 0.72, trended up to 2.6. Possibly 2/2 acyclovir, but due to the need for coverage of VZV and HSV encephalitis, we will continue the Acyclovir 800mg  q24.  -Continue LR @ 100 ml/hr, BUN/Cr decreased to 22/1.82 -BMP q4  # Active Varicella Zoster Infection Patient's husband states patient was diagnosed with shingles in May of this year, which resolved, but developed recurrence along her left breast/axillary region x 3 weeks. Patient had been in severe pain but had stopped taking her home Valtrex with 4 remaining doses prior to admission. May be contributing factor to encephalitis. - Patient started on empirical Acyclovir until LP able to be obtained - Scheduled for LP with sedation for 06/23/20  - Monitor for improvement in rash and mental status   # UTI with possible pyelonephritis U/A showed cloudy urine with large leukocytes, negative nitrites, small hemoglobinuria, no bacteria, >50 WBC, 11-20 RBC and mucus. Triple phosphate crystals present. CT abdomen/pelvis without contrast showed perinephric edema but no nephrolithiasis. Bladder distended with air-fluid level. Patient afebrile, initial WBC 18.1. Denies associated symptoms at this time.  - Urine culture grew Klebsiella Pneumoniae and Proteus Vulgaris  - Started empiric treatment with Augmentin, sensitivities showed Proteus resistance to Augmentin so we are switching to  ceftazidime 1g q24  - Will check bladder scan q4 hours with I&O cath if >300cc's    # Diverticulosis with possible acute Diverticulitis  Patient has had nausea, vomiting, decreased PO intake, and intermittent severe abdominal pain per patient's husband with benign physical exam aside from moderate abdominal distention. Patient afebrile. CT abdomen pelvis showed colonic diverticulosis with mild pericolonic fat stranding involving the junction of the descending and sigmoid colon in the region of multiple diverticula, suspicious for acute diverticulitis without perforation or abscess.  - Adding Flagyl 500 q8 for 2 more days, end date 06/24/20 - Continue to monitor daily CBC, vitals   # Hepatomegaly with Borderline Hepatic Steatosis and Possible Cirrhosis  Husband notes no alcohol history, ETOH < 10, urine tox negative. HIV negative, no known history of cirrhosis. Likely due to NASH given HTN, pre-diabetes, patient is overweight. Albumin 3.1, no thrombocytopenia. - Hep C Ab non reactive - PT/INR within normal limits, likely not cirrhosis  # Mild Splenomegaly Platelets 271 without thrombocytopenia. WBC 18.1 with neutrophil predominance, though monocyte count 3.1. Unclear etiology. - No follow up at this time   # History of HTN Patient takes HCTZ 25mg , Losartan 100mg , and Metoprolol succinate 100mg  daily. It is unclear if patient has been taking her amlodipine 5mg  daily at home.  - due to increased BP overnight, increased Norvasc to 10 qD and home metoprolol 100mg  PO qD -Holding HCTZ and Losartan due to possible hyponatremia and AKI respectively - Patient may benefit outpatient from lisinopril if microalbuminuria continues   #  Pre-Diabetes Hgb A1c 6.1 this admission. Patient not on any glucose lowering agents with no known hx DM.  - Sugars may be elevated in recent inflammation  - Continue to monitor daily BG     Prior to Admission Living Arrangement: Home Anticipated Discharge Location: Home  vs. SNF Barriers to Discharge: Acute encephalopathy, possible infectious etiologies   Alyssa Hudson, Medical Student 06/22/2020, 12:50 PM Pager: [phone] After 5pm on weekdays and 1pm on weekends: On Call pager [phone]

## 2020-06-22 NOTE — Consult Note (Signed)
Chief Complaint: Patient was seen in consultation today for Lumbar puncture with sedation vs anesthesia Chief Complaint  Patient presents with  . Altered Mental Status   at the request of Dr Wilford Corner   Supervising Physician: Dr Marliss Coots  Patient Status: [facility] - In-pt  History of Present Illness: Alyssa Hudson is a 79 y.o. 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 Facial droop   . Hypertension       Allergies: Codeine, Contrast media [iodinated diagnostic agents], Iodine-131, and Sulfa antibiotics  Medications: Prior to Admission medications   Medication Sig Start Date End Date Taking? Authorizing Provider  acetaminophen (TYLENOL) 500 MG tablet Take 500 mg by mouth every 6 (six) hours as needed for headache.   Yes [provider]  Biotin 5000 MCG TABS Take 5,000 mcg by mouth daily.   Yes [provider]  CRANBERRY PO Take 1 tablet by mouth daily.   Yes [provider]  gabapentin (NEURONTIN) 300 MG capsule Take 300 mg by mouth 3 (three) times daily.  06/11/20  Yes [provider]  hydrochlorothiazide (HYDRODIURIL) 25 MG tablet Take 25 mg by mouth daily. 05/11/20  Yes [provider]  LIDOCAINE EX Apply 1 application topically 3 (three) times daily as needed (shingles pain). cream   Yes [provider]  LIDOCAINE PAIN RELIEF EX Place 1 patch onto the skin daily as needed (shingles pain).   Yes [provider]  losartan (COZAAR) 100 MG tablet Take 100 mg by mouth daily. 03/07/20  Yes [provider]  metoprolol succinate (TOPROL-XL) 100 MG 24 hr tablet Take 100 mg  by mouth daily. 05/07/20  Yes [provider]  OVER THE COUNTER MEDICATION Take 2 capsules by mouth 2 (two) times daily. "macular protect"   Yes [provider]  Probiotic Product (PROBIOTIC PO) Take 1 tablet by mouth daily.   Yes [provider]  amLODipine (NORVASC) 5 MG tablet Take 5 mg by mouth daily. 05/07/20   [provider]  valACYclovir (VALTREX) 1000 MG tablet Take 1,000 mg by mouth 3 (three) times daily.    [provider]     No family history on file.  Social History   Socioeconomic History  . Marital status: Married    Spouse name: Not on file  . Number of children: Not on file  . Years of education: Not on file  . Highest education level: Not on file  Occupational History  . Not on file  Tobacco Use  . Smoking status: Not on file  Substance and Sexual Activity  . Alcohol use: Not on file  . Drug use: Not on file  . Sexual activity: Not on file  Other Topics Concern  . Not on file  Social History Narrative  . Not on file   Social Determinants of Health   Financial Resource Strain:   . Difficulty of Paying Living Expenses: Not on file  Food Insecurity:   . Worried About Programme researcher, broadcasting/film/video in the Last Year: Not on file  . Ran Out of Food in the Last Year: Not on file  Transportation Needs:   . Lack of  Transportation (Medical): Not on file  . Lack of Transportation (Non-Medical): Not on file  Physical Activity:   . Days of Exercise per Week: Not on file  . Minutes of Exercise per Session: Not on file  Stress:   . Feeling of Stress : Not on file  Social Connections:   . Frequency of Communication with Friends and Family: Not on file  . Frequency of Social Gatherings with Friends and Family: Not on file  . Attends Religious Services: Not on file  . Active Member of Clubs or Organizations: Not on file  . Attends Banker Meetings: Not on file  . Marital Status: Not on file     Review of Systems: A  12 point ROS discussed and pertinent positives are indicated in the HPI above.  All other systems are negative.  Review of Systems  Constitutional: Positive for activity change. Negative for fever.  Respiratory: Negative for shortness of breath.   Cardiovascular: Negative for chest pain.  Gastrointestinal: Negative for abdominal pain.  Neurological: Positive for facial asymmetry and speech difficulty. Negative for dizziness, light-headedness and headaches.  Psychiatric/Behavioral: Positive for confusion and decreased concentration.    Vital Signs: BP (!) 179/75 (BP Location: Right Arm)   Pulse 87   Temp 98.5 F (36.9 C) (Oral)   Resp 18   Ht 5\' 5"  (1.651 m)   Wt 180 lb (81.6 kg)   SpO2 96%   BMI 29.95 kg/m   Physical Exam Vitals reviewed.  Cardiovascular:     Rate and Rhythm: Normal rate and regular rhythm.     Heart sounds: Normal heart sounds.  Pulmonary:     Breath sounds: Normal breath sounds.  Abdominal:     Palpations: Abdomen is soft.  Musculoskeletal:        General: Normal range of motion.  Skin:    General: Skin is warm.  Neurological:     Mental Status: She is alert.     Comments: Altered mental status Able to tell me name; DOB and place today  Psychiatric:     Comments: Husband at bedside-- signed consent     Imaging: EEG  Result Date: 06/19/2020 06/21/2020, MD     06/19/2020  9:30 PM Patient Name: Alyssa Hudson MRN: Alyssa Hudson Epilepsy Attending: [id number] Referring Physician/Provider: Dr Charlsie Quest Date: 06/19/2020 Duration: 27.34 mins Patient history: 79yo F headache, ams, left sided weakness. EEG to evaluate for seizure. Level of alertness: Awake/ lethargic AEDs during EEG study: LEV Technical aspects: This EEG study was done with scalp electrodes positioned according to the 10-20 International system of electrode placement. Electrical activity was acquired at a sampling rate of 500Hz  and reviewed with a high frequency filter of 70Hz   and a low frequency filter of 1Hz . EEG data were recorded continuously and digitally stored. Description: No posterior dominant rhythm was seen. EEG showed continuous generalized and maximal right temporal region 3-6hz  theta-delta slowing. Hyperventilation and photic stimulation were not performed.   ABNORMALITY -Continuous slow, generalized and maximal right temporal region IMPRESSION: This study is suggestive of cortical dysfunction in right temporal region suggestive of underling structural abnormality, post-ictal state. Additionally there is moderate diffuse encephalopathy, nonspecific etiology but could be related to post ictal state. No seizures or epileptiform discharges were seen throughout the recording. Priyanka 06/21/2020   CT ABDOMEN PELVIS WO CONTRAST  Result Date: 06/20/2020 CLINICAL DATA:  Abdominal distension. Labs concerning for possible struvite stone. EXAM: CT ABDOMEN AND PELVIS WITHOUT CONTRAST TECHNIQUE:  Multidetector CT imaging of the abdomen and pelvis was performed following the standard protocol without IV contrast. COMPARISON:  None. FINDINGS: Lower chest: Mild dependent atelectasis with trace pleural thickening. No significant effusion. There are coronary artery calcifications. Upper normal heart size. Hepatobiliary: Borderline hepatic steatosis. Left lobe enlargement with questionable nodular hepatic contours. No discrete focal hepatic lesion. There is mild focal fatty infiltration adjacent to the gallbladder fossa. Clips in the gallbladder fossa postcholecystectomy. No biliary dilatation. Pancreas: Parenchymal atrophy. No ductal dilatation or inflammation. Spleen: Mild splenomegaly with spleen spanning 13.3 cm cranial caudal. No focal abnormality on noncontrast exam. Adrenals/Urinary Tract: Normal right adrenal gland. Mild left adrenal thickening without dominant nodule. Bilateral perinephric edema. No hydronephrosis. Mild renal cortical scarring in the upper and lower right kidney.  There are no radiopaque calculi in the kidneys or ureters. Ureters are decompressed. Urinary bladder is distended with air-fluid level. No bladder wall thickening. No bladder stone. Stomach/Bowel: Small hiatal hernia. Stomach is nondistended. Normal positioning of the duodenum and ligament of Treitz. There is no small bowel obstruction or inflammation. Normal appendix. Multifocal colonic diverticulosis, prominent in the sigmoid colon. Mild pericolonic fat stranding involving the junction of the descending and sigmoid colon, series 6, image 59, in the region of multiple diverticula. Vascular/Lymphatic: Aorto bi-iliac atherosclerosis increased number of small retroperitoneal lymph nodes, nonspecific but likely reactive. Reproductive: Status post hysterectomy. No adnexal masses. Other: No ascites and free fluid. No free air. No focal intra-abdominal abscess. There is a small fat containing umbilical hernia. Musculoskeletal: Multilevel degenerative change in the spine. Mild degenerative change of both hips. Bone island in the left acetabulum. There are no acute or suspicious osseous abnormalities. IMPRESSION: 1. No obstructive uropathy or radiopaque calculi. 2. Symmetric perinephric edema which is nonspecific. This may be chronic or seen in the setting of urinary tract infection. Mild right renal scarring. 3. Colonic diverticulosis with mild pericolonic fat stranding involving the junction of the descending and sigmoid colon in the region of multiple diverticula, suspicious for acute diverticulitis. No perforation or abscess. 4. Borderline hepatic steatosis. Left lobe enlargement with questionable nodular hepatic contours, can be seen with cirrhosis. Recommend correlation with cirrhosis risk factors. Mild splenomegaly. 5. Small hiatal hernia. 6. Small fat containing umbilical hernia. Aortic Atherosclerosis (ICD10-I70.0). Electronically Signed   By: Narda Rutherford M.D.   On: 06/20/2020 16:12   MR BRAIN W WO  CONTRAST  Result Date: 06/19/2020 CLINICAL DATA:  79 year old female with code stroke presentation this morning, although unilateral weakness now resolved. Allergy to iodinated contrast. EXAM: MRI HEAD WITHOUT AND WITH CONTRAST TECHNIQUE: Multiplanar, multiecho pulse sequences of the brain and surrounding structures were obtained without and with intravenous contrast. CONTRAST:  6mL GADAVIST GADOBUTROL 1 MMOL/ML IV SOLN COMPARISON:  Plain head CT earlier today. FINDINGS: Study is intermittently degraded by motion artifact despite repeated imaging attempts. Brain: No convincing restricted diffusion. Physiologic anisotropy is felt to explain a small area of increased trace DWI signal in the central midbrain (series 3, image 22) which remains normal on the remaining sequences. Widely scattered and mostly subcortical bilateral white matter T2 and FLAIR hyperintensity. Chronic lacunar infarcts versus perivascular spaces in the bilateral basal ganglia. There is a chronic lacunar infarct of the right caudate superimposed. Negative brainstem. Several tiny chronic infarcts in both cerebellar hemispheres. No midline shift, mass effect, evidence of mass lesion, ventriculomegaly, extra-axial collection or acute intracranial hemorrhage. Cervicomedullary junction and pituitary are within normal limits. No chronic cerebral blood products identified. Following contrast no abnormal enhancement  is identified. No dural thickening is evident. Vascular: Major intracranial vascular flow voids are preserved. Skull and upper cervical spine: Normal for age visible cervical spine, bone marrow signal. Sinuses/Orbits: Negative. Other: None IMPRESSION: Mildly motion degraded study but no acute intracranial abnormality is identified. Chronic small vessel disease. Study discussed by telephone with Dr. Wilford Corner on 06/19/2020 at 1350 hours. Electronically Signed   By: Odessa Fleming M.D.   On: 06/19/2020 13:59   DG Fluoro Rm 1-60 Min  Result Date:  06/20/2020 CLINICAL DATA:  Patient history of encephalitis. EXAM: DG C-ARM 1-60 MIN FLUOROSCOPY TIME:  Fluoroscopy Time:  6 seconds Radiation Exposure Index (if provided by the fluoroscopic device): 8.7 mGy COMPARISON:  No pertinent priors. FINDINGS: Patient was brought down and placed in a prone position on the table. Initial images were obtained. After approximately 1 minute the patient stated she was unable to continue and rolled over to her side. The patient was not marked and no injections were given. Patient had motion throughout the exam. IMPRESSION: Unable to attempt fluoro guided LP secondary to patient's inability to lie still in a prone position with associated leg movements. Electronically Signed   By: Annia Belt M.D.   On: 06/20/2020 15:58   DG Fluoro Rm 1-60 Min  Result Date: 06/19/2020 CLINICAL DATA:  Encephalitis. EXAM: DG C-ARM 1-60 MIN FLUOROSCOPY TIME:  Fluoroscopy Time:  18 seconds Radiation Exposure Index (if provided by the fluoroscopic device): 4.60 mGy. Number of Acquired Spot Images: 0 COMPARISON:  No pertinent prior exams are available for comparison. FINDINGS: Fluoroscopy was utilized to assess for an appropriate entry site for lumbar puncture. However, the procedure was aborted prior to site marking or needle entry due to patient altered mental status, restlessness and agitation. IMPRESSION: Aborted fluoroscopically-guided lumbar puncture due to patient altered mental status, restlessness and agitation. Electronically Signed   By: Jackey Loge DO   On: 06/19/2020 16:56   DG Chest Portable 1 View  Result Date: 06/19/2020 CLINICAL DATA:  Altered level of consciousness EXAM: PORTABLE CHEST 1 VIEW COMPARISON:  None. FINDINGS: Single frontal view was obtained with the patient rotated to the right. The cardiac silhouette is unremarkable. No airspace disease, effusion, or pneumothorax. No acute bony abnormalities. IMPRESSION: 1. No acute intrathoracic process. Electronically Signed    By: Sharlet Salina M.D.   On: 06/19/2020 15:58   CT HEAD CODE STROKE WO CONTRAST  Result Date: 06/19/2020 CLINICAL DATA:  Code stroke. 79 year old female with altered mental status. EXAM: CT HEAD WITHOUT CONTRAST TECHNIQUE: Contiguous axial images were obtained from the base of the skull through the vertex without intravenous contrast. COMPARISON:  None. FINDINGS: Brain: No midline shift, mass effect, or evidence of intracranial mass lesion. No ventriculomegaly. No acute intracranial hemorrhage identified. Scattered bilateral hypodensity in the cerebral white matter, basal ganglia. The thalami, brainstem and cerebellum appear spared. No superimposed cortical encephalomalacia identified. No cortically based acute infarct identified. Vascular: Calcified atherosclerosis at the skull base. There is mildly asymmetric increased density of the right MCA, best seen on sagittal image 17. Skull: No acute osseous abnormality identified. Sinuses/Orbits: Well pneumatized aside from left ethmoid mucosal thickening. Other: No acute orbit or scalp soft tissue finding. ASPECTS [facility] Stroke Program Early CT Score) Total score (0-10 with 10 being normal): 10 IMPRESSION: 1. Equivocal asymmetric increased density of the Right MCA, but no acute cortically based infarct or acute intracranial hemorrhage identified. ASPECTS 10. 2. Evidence of chronic small vessel disease. 3. These results were discussed Dr. Kathie Rhodes.  Khaliqdina at 11:40hours by telephone. Electronically Signed   By: Odessa FlemingH  Hall M.D.   On: 06/19/2020 11:52    Labs:  CBC: Recent Labs    06/19/20 1356 06/20/20 0322 06/21/20 0557 06/22/20 0332  WBC 18.1* 16.4* 13.9* 16.9*  HGB 13.3 12.1 12.1 12.6  HCT 40.0 35.8* 36.1 38.3  PLT 271 243 221 272    COAGS: Recent Labs    06/19/20 1356 06/21/20 0106  INR 1.1 1.0  APTT 27  --     BMP: Recent Labs    06/21/20 1423 06/21/20 2121 06/22/20 0332 06/22/20 0826  NA 127* 130* 132* 132*  K 3.3* 3.3* 3.6 3.7  CL  92* 92* 97* 98  CO2 23 23 24 26   GLUCOSE 120* 136* 123* 157*  BUN 26* 25* 23 22  CALCIUM 8.3* 8.4* 8.8* 8.8*  CREATININE 2.56* 2.26* 1.98* 1.82*  GFRNONAA 17* 20* 23* 26*  GFRAA 20* 23* 27* 30*    LIVER FUNCTION TESTS: Recent Labs    06/19/20 1356 06/20/20 0322  BILITOT 0.7 0.8  AST 18 16  ALT 18 16  ALKPHOS 84 77  PROT 6.4* 5.8*  ALBUMIN 3.1* 2.8*    TUMOR MARKERS: No results for input(s): AFPTM, CEA, CA199, CHROMGRNA in the last 8760 hours.  Assessment and Plan:  Altered mental status-- seems better today Encephalopathy-- possible viral source Pt has been unable to tolerate LP Now scheduled for procedure using anesthesia in IR Pt and husband are aware of procedure benefits and risks Including but not limited to Infection; bleeding; damage to surrounding structures; spinal headache; spinal fluid leak She and husband are agreeable to proceed Consent signed in chart   Thank you for this interesting consult.  I greatly enjoyed meeting Alyssa ApleyLorene Bacchi and look forward to participating in their care.  A copy of this report was sent to the requesting provider on this date.  Electronically Signed: Robet LeuPamela A Jerah Esty, PA-C 06/22/2020, 11:19 AM   I spent a total of 40 Minutes    in face to face in clinical consultation, greater than 50% of which was counseling/coordinating care for Lumbar puncture with anesthesia

## 2020-06-23 ENCOUNTER — Inpatient Hospital Stay (HOSPITAL_COMMUNITY): Payer: Medicare PPO | Admitting: Certified Registered"

## 2020-06-23 ENCOUNTER — Encounter (HOSPITAL_COMMUNITY): Payer: Self-pay | Admitting: Internal Medicine

## 2020-06-23 ENCOUNTER — Inpatient Hospital Stay (HOSPITAL_COMMUNITY): Payer: Medicare PPO

## 2020-06-23 ENCOUNTER — Encounter (HOSPITAL_COMMUNITY)
Admission: EM | Disposition: A | Discharge status: Home Health | Payer: Self-pay | Source: Home / Self Care | Attending: Internal Medicine

## 2020-06-23 DIAGNOSIS — R1031 Right lower quadrant pain: Secondary | ICD-10-CM

## 2020-06-23 HISTORY — PX: IR FLUORO GUIDED NEEDLE PLC ASPIRATION/INJECTION LOC: IMG2395

## 2020-06-23 HISTORY — PX: RADIOLOGY WITH ANESTHESIA: SHX6223

## 2020-06-23 LAB — GLUCOSE, CSF: Glucose, CSF: 53 mg/dL (ref 40–70)

## 2020-06-23 LAB — CSF CELL COUNT WITH DIFFERENTIAL
Eosinophils, CSF: 0 % (ref 0–1)
Eosinophils, CSF: 0 % (ref 0–1)
Lymphs, CSF: 67 % (ref 40–80)
Lymphs, CSF: 68 % (ref 40–80)
Monocyte-Macrophage-Spinal Fluid: 20 % (ref 15–45)
Monocyte-Macrophage-Spinal Fluid: 22 % (ref 15–45)
RBC Count, CSF: 18 /mm3 — ABNORMAL HIGH
RBC Count, CSF: 6 /mm3 — ABNORMAL HIGH
Segmented Neutrophils-CSF: 10 % — ABNORMAL HIGH (ref 0–6)
Segmented Neutrophils-CSF: 13 % — ABNORMAL HIGH (ref 0–6)
Tube #: 1
Tube #: 4
WBC, CSF: 23 /mm3 (ref 0–5)
WBC, CSF: 31 /mm3 (ref 0–5)

## 2020-06-23 LAB — CBC
HCT: 39.1 % (ref 36.0–46.0)
Hemoglobin: 12.8 g/dL (ref 12.0–15.0)
MCH: 27.7 pg (ref 26.0–34.0)
MCHC: 32.7 g/dL (ref 30.0–36.0)
MCV: 84.6 fL (ref 80.0–100.0)
Platelets: 267 10*3/uL (ref 150–400)
RBC: 4.62 MIL/uL (ref 3.87–5.11)
RDW: 14.6 % (ref 11.5–15.5)
WBC: 14 10*3/uL — ABNORMAL HIGH (ref 4.0–10.5)
nRBC: 0 % (ref 0.0–0.2)

## 2020-06-23 LAB — BASIC METABOLIC PANEL
Anion gap: 11 (ref 5–15)
BUN: 19 mg/dL (ref 8–23)
CO2: 26 mmol/L (ref 22–32)
Calcium: 8.9 mg/dL (ref 8.9–10.3)
Chloride: 101 mmol/L (ref 98–111)
Creatinine, Ser: 1.11 mg/dL — ABNORMAL HIGH (ref 0.44–1.00)
GFR calc Af Amer: 54 mL/min — ABNORMAL LOW (ref >60)
GFR calc non Af Amer: 47 mL/min — ABNORMAL LOW (ref >60)
Glucose, Bld: 148 mg/dL — ABNORMAL HIGH (ref 70–99)
Potassium: 3.4 mmol/L — ABNORMAL LOW (ref 3.5–5.1)
Sodium: 138 mmol/L (ref 135–145)

## 2020-06-23 LAB — COMPREHENSIVE METABOLIC PANEL
ALT: 13 U/L (ref 0–44)
AST: 10 U/L — ABNORMAL LOW (ref 15–41)
Albumin: 2.7 g/dL — ABNORMAL LOW (ref 3.5–5.0)
Alkaline Phosphatase: 71 U/L (ref 38–126)
Anion gap: 12 (ref 5–15)
BUN: 20 mg/dL (ref 8–23)
CO2: 25 mmol/L (ref 22–32)
Calcium: 9.1 mg/dL (ref 8.9–10.3)
Chloride: 101 mmol/L (ref 98–111)
Creatinine, Ser: 1.32 mg/dL — ABNORMAL HIGH (ref 0.44–1.00)
GFR calc Af Amer: 44 mL/min — ABNORMAL LOW (ref >60)
GFR calc non Af Amer: 38 mL/min — ABNORMAL LOW (ref >60)
Glucose, Bld: 111 mg/dL — ABNORMAL HIGH (ref 70–99)
Potassium: 3.8 mmol/L (ref 3.5–5.1)
Sodium: 138 mmol/L (ref 135–145)
Total Bilirubin: 0.9 mg/dL (ref 0.3–1.2)
Total Protein: 6.2 g/dL — ABNORMAL LOW (ref 6.5–8.1)

## 2020-06-23 LAB — SURGICAL PCR SCREEN
MRSA, PCR: NEGATIVE
Staphylococcus aureus: NEGATIVE

## 2020-06-23 LAB — ROCKY MTN SPOTTED FVR ABS PNL(IGG+IGM)
RMSF IgG: NEGATIVE
RMSF IgG: NEGATIVE
RMSF IgM: 0.08 index (ref 0.00–0.89)
RMSF IgM: 0.32 index (ref 0.00–0.89)

## 2020-06-23 LAB — CRYPTOCOCCAL ANTIGEN, CSF: Crypto Ag: NEGATIVE

## 2020-06-23 LAB — PROTEIN, CSF: Total  Protein, CSF: 90 mg/dL — ABNORMAL HIGH (ref 15–45)

## 2020-06-23 SURGERY — IR WITH ANESTHESIA
Anesthesia: General

## 2020-06-23 MED ORDER — ENOXAPARIN SODIUM 40 MG/0.4ML ~~LOC~~ SOLN
40.0000 mg | SUBCUTANEOUS | Status: DC
  Administered 2020-06-23 – 2020-06-30 (×8): 40 mg via SUBCUTANEOUS
  Filled 2020-06-23 (×10): qty 0.4

## 2020-06-23 MED ORDER — ONDANSETRON HCL 4 MG/2ML IJ SOLN
INTRAMUSCULAR | Status: DC | PRN
  Administered 2020-06-23: 4 mg via INTRAVENOUS

## 2020-06-23 MED ORDER — SODIUM CHLORIDE 0.9 % IV SOLN
1.0000 g | Freq: Two times a day (BID) | INTRAVENOUS | Status: DC
  Administered 2020-06-23 – 2020-06-25 (×4): 1 g via INTRAVENOUS
  Filled 2020-06-23 (×5): qty 1

## 2020-06-23 MED ORDER — LACTATED RINGERS IV SOLN: INTRAVENOUS | Status: DC | PRN

## 2020-06-23 MED ORDER — PROPOFOL 10 MG/ML IV BOLUS
INTRAVENOUS | Status: DC | PRN
  Administered 2020-06-23: 100 mg via INTRAVENOUS

## 2020-06-23 MED ORDER — SUCCINYLCHOLINE CHLORIDE 200 MG/10ML IV SOSY
PREFILLED_SYRINGE | INTRAVENOUS | Status: DC | PRN
  Administered 2020-06-23: 120 mg via INTRAVENOUS

## 2020-06-23 MED ORDER — LIDOCAINE 2% (20 MG/ML) 5 ML SYRINGE
INTRAMUSCULAR | Status: DC | PRN
  Administered 2020-06-23: 40 mg via INTRAVENOUS

## 2020-06-23 MED ORDER — PHENYLEPHRINE 40 MCG/ML (10ML) SYRINGE FOR IV PUSH (FOR BLOOD PRESSURE SUPPORT)
PREFILLED_SYRINGE | INTRAVENOUS | Status: DC | PRN
  Administered 2020-06-23 (×5): 80 ug via INTRAVENOUS

## 2020-06-23 MED ORDER — POTASSIUM CHLORIDE CRYS ER 20 MEQ PO TBCR
40.0000 meq | EXTENDED_RELEASE_TABLET | Freq: Once | ORAL | Status: AC
  Administered 2020-06-23: 40 meq via ORAL
  Filled 2020-06-23: qty 2

## 2020-06-23 MED ORDER — FENTANYL CITRATE (PF) 100 MCG/2ML IJ SOLN
INTRAMUSCULAR | Status: DC | PRN
Start: 2020-06-23 — End: 2020-06-23
  Administered 2020-06-23: 50 ug via INTRAVENOUS

## 2020-06-23 MED ORDER — DEXTROSE 5 % IV SOLN
800.0000 mg | Freq: Two times a day (BID) | INTRAVENOUS | Status: DC
  Administered 2020-06-23 – 2020-06-25 (×5): 800 mg via INTRAVENOUS
  Filled 2020-06-23 (×7): qty 16

## 2020-06-23 NOTE — Anesthesia Procedure Notes (Signed)
Procedure Name: Intubation Date/Time: 06/23/2020 3:22 PM Performed by: Candis Shine, CRNA Pre-anesthesia Checklist: Patient identified, Emergency Drugs available, Suction available and Patient being monitored Patient Re-evaluated:Patient Re-evaluated prior to induction Oxygen Delivery Method: Circle system utilized Preoxygenation: Pre-oxygenation with 100% oxygen Induction Type: IV induction Ventilation: Mask ventilation without difficulty Laryngoscope Size: Mac and 3 Grade View: Grade I Tube type: Oral Tube size: 7.0 mm Number of attempts: 1 Airway Equipment and Method: Stylet Placement Confirmation: ETT inserted through vocal cords under direct vision,  positive ETCO2 and breath sounds checked- equal and bilateral Secured at: 21 cm Tube secured with: Tape Dental Injury: Teeth and Oropharynx as per pre-operative assessment

## 2020-06-23 NOTE — Transfer of Care (Signed)
Immediate Anesthesia Transfer of Care Note  Patient: Alyssa Hudson  Procedure(s) Performed: IR WITH ANESTHESIA (N/A )  Patient Location: PACU  Anesthesia Type:General  Level of Consciousness: awake, alert  and oriented  Airway & Oxygen Therapy: Patient Spontanous Breathing and Patient connected to face mask oxygen  Post-op Assessment: Report given to RN and Post -op Vital signs reviewed and stable  Post vital signs: Reviewed and stable  Last Vitals:  Vitals Value Taken Time  BP 163/78 06/23/20 1615  Temp 36.6 C 06/23/20 1615  Pulse 89 06/23/20 1621  Resp 23 06/23/20 1621  SpO2 100 % 06/23/20 1621  Vitals shown include unvalidated device data.  Last Pain:  Vitals:   06/23/20 1615  TempSrc:   PainSc: 0-No pain         Complications: No complications documented.

## 2020-06-23 NOTE — Anesthesia Preprocedure Evaluation (Signed)
Anesthesia Evaluation  Patient identified by MRN, date of birth, ID band Patient awake    Reviewed: Allergy & Precautions, H&P , NPO status , Patient's Chart, lab work & pertinent test results  Airway Mallampati: II   Neck ROM: full    Dental   Pulmonary    breath sounds clear to auscultation       Cardiovascular hypertension,  Rhythm:regular Rate:Normal     Neuro/Psych Encelphalitis. AMS.    GI/Hepatic   Endo/Other    Renal/GU Renal InsufficiencyRenal disease     Musculoskeletal   Abdominal   Peds  Hematology   Anesthesia Other Findings   Reproductive/Obstetrics                             Anesthesia Physical Anesthesia Plan  ASA: III  Anesthesia Plan: MAC   Post-op Pain Management:    Induction: Intravenous  PONV Risk Score and Plan: 2 and Propofol infusion, Ondansetron and Treatment may vary due to age or medical condition  Airway Management Planned: Simple Face Mask  Additional Equipment:   Intra-op Plan:   Post-operative Plan:   Informed Consent: I have reviewed the patients History and Physical, chart, labs and discussed the procedure including the risks, benefits and alternatives for the proposed anesthesia with the patient or authorized representative who has indicated his/her understanding and acceptance.       Plan Discussed with: Anesthesiologist and CRNA  Anesthesia Plan Comments:         Anesthesia Quick Evaluation

## 2020-06-23 NOTE — Progress Notes (Signed)
Pharmacy Antibiotic Note  Alyssa Hudson is a 79 y.o. female admitted on 06/19/2020 presenting as code stroke, known shingles infection and concern for related encephalopathy.  Pharmacy has been consulted for acyclovir dosing.  Pt not following commands at this time and not a good PO candidate for tx.    Will dose 10mg /kg based on actual body weight since BMI <30.   LP was performed 9/24 but CSF was not obtained. MRI showed no acute intracranial abnormality.  Plan to continue acyclovir until successful LP.  Plan: Acyclovir 800 mg IV changed to every 12 hours based on CrCl 35.2 mL/min  Monitor renal function, encephalopathy workup, ability to transition to PO  Height: 5\' 5"  (165.1 cm) Weight: 81.6 kg (180 lb) IBW/kg (Calculated) : 57  Temp (24hrs), Avg:98.2 F (36.8 C), Min:98 F (36.7 C), Max:98.5 F (36.9 C)  Recent Labs  Lab 06/19/20 1356 06/19/20 2243 06/20/20 0322 06/21/20 0106 06/21/20 0557 06/21/20 1423 06/22/20 0332 06/22/20 0826 06/22/20 1054 06/22/20 1607 06/23/20 0403 06/23/20 0440  WBC 18.1*  --  16.4*  --  13.9*  --  16.9*  --   --   --  14.0*  --   CREATININE 0.72   < > 1.35*   < > 2.69*   < > 1.98* 1.82* 1.70* 1.62*  --  1.32*   < > = values in this interval not displayed.    Estimated Creatinine Clearance: 35.2 mL/min (A) (by C-G formula based on SCr of 1.32 mg/dL (H)).    Allergies  Allergen Reactions  . Codeine Nausea Only and Nausea And Vomiting    Insomnia  . Contrast Media [Iodinated Diagnostic Agents] Other (See Comments)    Turned red all over  . Iodine-131 Hives and Swelling  . Sulfa Antibiotics Nausea Only    Allyne Hebert A. 06/25/20, PharmD, BCPS, FNKF Clinical Pharmacist [place] Please utilize Amion for appropriate phone number to reach the unit pharmacist [facility])   06/23/2020 9:33 AM

## 2020-06-23 NOTE — Progress Notes (Signed)
PT Cancellation Note  Patient Details Name: Monice Lundy MRN: [id number] DOB: 03-26-1941   Cancelled Treatment:    Reason Eval/Treat Not Completed: Patient at procedure or test/unavailable per RN patient about to leave for lumbar puncture procedure. Will hold PT for now and return if time/schedule allows and when she is medically ready.    Madelaine Etienne, DPT, PN1   Supplemental Physical Therapist [facility]    Pager [phone] Acute Rehab Office [phone]

## 2020-06-23 NOTE — Procedures (Signed)
Fluoroscopically guided lumbar puncture performed in IR with anesthesia without immediate complication. Puncture performed at L4-5 with return of clear CSF. 13 mL of CSF collected for laboratory studies.

## 2020-06-23 NOTE — Progress Notes (Signed)
Subjective:   Alyssa Hudson was evaluated at bedside this morning, overnight has had a bladder scan which showed roughly 1L of retained urine. I/O cath produced 1.2L of urine. She reports she is doing well overall, and feels slightly more herself today. Endorses that she wants to get up and walk around. Endorses some mild low back pain. She continues to deny abdominal pain, and pain with urination. Denies CP, SOB, dysuria.   Objective:  Vital signs in last 24 hours: Vitals:   06/23/20 0308 06/23/20 0739 06/23/20 1148 06/23/20 1215  BP: (!) 174/63 (!) 185/69 107/69 (!) 188/79  Pulse: 82 83 (!) 108 94  Resp: 17 18 18 18   Temp: 98 F (36.7 C) 98.2 F (36.8 C) 97.6 F (36.4 C) 98.3 F (36.8 C)  TempSrc: Oral Oral Oral Oral  SpO2: 96% 97% 100% 97%  Weight:      Height:       PHYSICAL EXAMINATION:  General Appearance: The patient is a well-developed, well-nourished, in no acute distress.  HEENT: Extraocular movements are intact. Pupils are round and reactive to light. Conjunctivae are pink and moist. Sclerae are white and nonicteric.  Mouth: Oral mucosa is pink and moist. Dentition is good.  Lungs: Clear to auscultation bilaterally. There are no crackles, wheezes or rhonchi noted. There is no crepitus on palpation.  Heart: Regular rate and rhythm, S1/S2. No murmurs are noted. There are no lifts, heaves or thrills noted on palpation.  Abdomen: Mild tenderness in RLQ. There are good bowel sounds. There is no rebound or guarding.   Skin: Vesicular rash over left axilla consistent with varicella zoster. Warm and dry with good turgor.  MSK: There is no clubbing, cyanosis or edema. Motor and strength is 5/5 in upper and lower extremities.  Neuro: Left facial droop, chronic. Sensation is equal and intact in BLE  Psych: The patient is oriented to person, place, and year today. Was not able to subract 7 from 100 today. Continued to appear agitated/restless, although  improved.  Assessment/Plan:  Principal Problem:   Encephalopathy Active Problems:   Hyponatremia  # Acute Encephalopathy Patient developed seizure-like symptoms acutely the morning of 06/19/20 following severe headache and abdominal pain, in the setting of acute shingles infection. Differential is wide and includes seizure/post-ictal state, VZV encephalitis, tick-borne illness, other infectious cause, delirium in setting of possible UTI vs. Pyelonephritis, diverticulitis, hyponatremia-induced vs. medication induced (on high dose gabapentin at home). Husband states that at baseline she is oriented and independent in ADL's. CT head without contrast showed small vessel disease and an equivocal asymmetric increased density of the Right MCA, without acute disease process.06/21/20 MRI brain without contrast showed chronic lacunar infarcts vs. perivascular spaces in the bilateral BG and chronic lacunar infarct of the right caudate with tiny infarcts of bilateral cerebellar hemispheres, but no acute intracranial abnormality. EEG showed cortical dysfunction of the R temporal region and moderate diffuse encephalopathy possibly consistent with post-ictal state. - Neurology on board, appreciate their recommendations - Empirically treating with acyclovir and doxycycline for VZV encephalitis and tick-borne illness - Treating Pyelonephritis / Diverticulitis with ceftazidime with end date around 06/26/20(7d total) and Flagyl with end date 06/24/20 - Blood cultures negative   # Hypotonic, Hypochloremic Hyponatremia  Initial sodium 123, chloride 86, Serum osms 259, Urine osms and urine sodium inappropriately elevated at 505 and 75, respectively. Patient is on HCTZ 25mg  and Losartan 100mg  daily at home. Patient appears euvolemic today. Creatinine and BUN downtrending. Possibly due to a combined picture  of SIADH and home medications HCTZ and Losartan. No ECHO on file although CT abdomen/pelvis showed possible cirrhosis, mild  hepatic steatosis.  - Patient passed bedside swallow evaluation, on regular diet - Started on LR @ 50 ml/hr for severe AKI on acyclovir, hyponatremia improved, stable at 138 this morning -continue LR as above - Repeat BMP and adjust for either q6 or q12 hours based on results, sodium correction goal < 8 point per 24 hours    # Active Varicella Zoster Infection Patient's husband states patient was diagnosed with shingles in May of this year, which resolved, but developed recurrence along her left breast/axillary region x 3 weeks. Patient had been in severe pain but had stopped taking her home Valtrex with 4 remaining doses prior to admission. May be contributing factor to encephalitis. - Patient on empirical Acyclovir as above - LP with sedation performed today, awaiting results - Monitor for improvement in rash and mental status   # AKI # UTI with possible pyelonephritis BUN on admission was 14, trended up to 27. Cr was 0.72, trended up to 2.6. Possibly 2/2 acyclovir, but due to the need for coverage of VZV and HSV encephalitis. U/A showed cloudy urine with large leukocytes, negative nitrites, small hemoglobinuria, no bacteria, >50 WBC, 11-20 RBC and mucus. Triple phosphate crystals present. CT abdomen/pelvis without contrast showed perinephric edema but no nephrolithiasis. Bladder distended with air-fluid level. Patient afebrile, initial WBC 18.1. Denies associated symptoms at this time.  - Urine culture grew Klebsiella Pneumoniae and Proteus Vulgaris  - sensitivities showed Proteus resistance to Augmentin, switched to ceftazidime 1g q24 on 06/22/20 with end date around 06/26/20(7d total) - Will check bladder scan q4 hours with I&O cath if >300cc's   -we will continue the Acyclovir 800mg  q24.  -Continue LR @ 50 ml/hr, BUN/Cr decreased to 20/1.32  -BMP as above  # Diverticulosis with possible acute Diverticulitis  Patient has had nausea, vomiting, decreased PO intake, and intermittent severe  abdominal pain per patient's husband with benign physical exam aside from moderate abdominal distention. Patient afebrile. CT abdomen pelvis showed colonic diverticulosis with mild pericolonic fat stranding involving the junction of the descending and sigmoid colon in the region of multiple diverticula, suspicious for acute diverticulitis without perforation or abscess.  - Adding Flagyl 500 q8, end date 06/24/20 - Continue to monitor daily CBC, vitals   # Hepatomegaly with Borderline Hepatic Steatosis   Husband notes no alcohol history, ETOH < 10, urine tox negative. HIV negative, no known history of cirrhosis. Likely due to NASH given HTN, pre-diabetes, patient is overweight. Albumin 3.1, no thrombocytopenia. - Hep C Ab non reactive - PT/INR within normal limits, likely not cirrhosis  # Mild Splenomegaly Platelets 271 without thrombocytopenia. WBC 18.1 with neutrophil predominance, though monocyte count 3.1. Unclear etiology. - No follow up at this time   # History of HTN Patient takes HCTZ 25mg , Losartan 100mg , and Metoprolol succinate 100mg  daily. It is unclear if patient has been taking her amlodipine 5mg  daily at home.  - due to increased BP overnight, increased Norvasc to 10 qD and home metoprolol 100mg  PO qD -Holding HCTZ and Losartan due to possible hyponatremia and AKI respectively - Patient may benefit outpatient from lisinopril if microalbuminuria continues   # Pre-Diabetes Hgb A1c 6.1 this admission. Patient not on any glucose lowering agents with no known hx DM.  - Sugars may be elevated in recent inflammation  - Continue to monitor daily BG     Prior to Admission Living  Arrangement: Home Anticipated Discharge Location: Home vs. SNF Barriers to Discharge: Acute encephalopathy, possible infectious etiologies   Pepper Pike Callas, Medical Student 06/23/2020, 2:24 PM Pager: [phone] After 5pm on weekdays and 1pm on weekends: On Call pager [phone]

## 2020-06-23 NOTE — Progress Notes (Signed)
RN paged MD for patient retaining 488 ml of urine in bladder. MD ordered to continue to in and cath patient. After catheterization, 625 ml of urine was removed.

## 2020-06-24 ENCOUNTER — Encounter (HOSPITAL_COMMUNITY): Payer: Self-pay | Admitting: Radiology

## 2020-06-24 DIAGNOSIS — R836 Abnormal cytological findings in cerebrospinal fluid: Secondary | ICD-10-CM

## 2020-06-24 LAB — CULTURE, BLOOD (ROUTINE X 2)
Culture: NO GROWTH
Culture: NO GROWTH
Special Requests: ADEQUATE
Special Requests: ADEQUATE

## 2020-06-24 LAB — BASIC METABOLIC PANEL
Anion gap: 10 (ref 5–15)
BUN: 17 mg/dL (ref 8–23)
CO2: 28 mmol/L (ref 22–32)
Calcium: 8.8 mg/dL — ABNORMAL LOW (ref 8.9–10.3)
Chloride: 102 mmol/L (ref 98–111)
Creatinine, Ser: 0.94 mg/dL (ref 0.44–1.00)
GFR calc Af Amer: >60 mL/min (ref >60)
GFR calc non Af Amer: 57 mL/min — ABNORMAL LOW (ref >60)
Glucose, Bld: 105 mg/dL — ABNORMAL HIGH (ref 70–99)
Potassium: 4.1 mmol/L (ref 3.5–5.1)
Sodium: 140 mmol/L (ref 135–145)

## 2020-06-24 LAB — CBC
HCT: 36 % (ref 36.0–46.0)
Hemoglobin: 11.5 g/dL — ABNORMAL LOW (ref 12.0–15.0)
MCH: 27.4 pg (ref 26.0–34.0)
MCHC: 31.9 g/dL (ref 30.0–36.0)
MCV: 85.9 fL (ref 80.0–100.0)
Platelets: 260 10*3/uL (ref 150–400)
RBC: 4.19 MIL/uL (ref 3.87–5.11)
RDW: 14.5 % (ref 11.5–15.5)
WBC: 11 10*3/uL — ABNORMAL HIGH (ref 4.0–10.5)
nRBC: 0 % (ref 0.0–0.2)

## 2020-06-24 LAB — CMV DNA BY PCR, QUALITATIVE
CMV DNA, Qual PCR: NEGATIVE
CMV DNA, Qual PCR: NEGATIVE

## 2020-06-24 NOTE — Anesthesia Postprocedure Evaluation (Signed)
Anesthesia Post Note  Patient: Alyssa Hudson  Procedure(s) Performed: IR WITH ANESTHESIA (N/A )     Patient location during evaluation: PACU Anesthesia Type: General Level of consciousness: awake and alert Pain management: pain level controlled Vital Signs Assessment: post-procedure vital signs reviewed and stable Respiratory status: spontaneous breathing, nonlabored ventilation, respiratory function stable and patient connected to nasal cannula oxygen Cardiovascular status: blood pressure returned to baseline and stable Postop Assessment: no apparent nausea or vomiting Anesthetic complications: no   No complications documented.  Last Vitals:  Vitals:   06/24/20 0341 06/24/20 0723  BP: (!) 164/68 (!) 184/83  Pulse: 77 76  Resp: 17 18  Temp: 36.6 C 36.8 C  SpO2: 95% 96%    Last Pain:  Vitals:   06/24/20 0723  TempSrc: Oral  PainSc:                  Asherah Lavoy S

## 2020-06-24 NOTE — Evaluation (Signed)
Physical Therapy Evaluation Patient Details Name: Alyssa Hudson MRN: [id number] DOB: 06/27/1941 Today's Date: 06/24/2020   History of Present Illness  Pt is an 79 year old woman with a history of HTN, recurrent shingles, and left facial nerve palsy presenting with with acute encephalopathy after a possible seizure event at home following a week of vomiting and abdominal pain with headache in the context of ongoing shingles.  CXR and MRI 9/24 negative for acute findings.  Clinical Impression  Patient presents with decreased mobility due to weakness, decreased balance, coordination and decreased knowledge of use of DME.  She was independent without device prior to admission doing chores around the home, but currently needs min A for mobility around bed to recliner with RW.  Spouse present and able to assist.  Recommend HHPT and aide and RW for home.  PT to follow.     Follow Up Recommendations Home health PT;Supervision/Assistance - 24 hour [facility])    Equipment Recommendations  None recommended by PT    Recommendations for Other Services       Precautions / Restrictions Precautions Precautions: Fall      Mobility  Bed Mobility Overal bed mobility: Needs Assistance Bed Mobility: Sidelying to Sit;Rolling Rolling: Min assist Sidelying to sit: Min assist       General bed mobility comments: cues for technique due to back pain and initially trying to pull up  Transfers Overall transfer level: Needs assistance Equipment used: Rolling walker (2 wheeled) Transfers: Sit to/from [employer] Sit to Stand: Min assist Stand pivot transfers: Min assist       General transfer comment: cues for hand placement, assist for safety and walker management, pt eager to get to [facility] due to needing to urinate  Ambulation/Gait Ambulation/Gait assistance: Min assist Gait Distance (Feet): 12 Feet Assistive device: Rolling walker (2 wheeled) Gait Pattern/deviations: Decreased  stride length;Step-through pattern     General Gait Details: cues for technique and assist for balance and walker safety, walked around bed to recliner, limited due to fatigue with not up OOB in 6 days per pt.  Stairs            Wheelchair Mobility    Modified Rankin (Stroke Patients Only)       Balance Overall balance assessment: Needs assistance Sitting-balance support: Feet supported;Single extremity supported Sitting balance-Leahy Scale: Poor Sitting balance - Comments: holding onto rail entire time seated at EOB with MD team in for assessment   Standing balance support: Bilateral upper extremity supported;During functional activity;Single extremity supported Standing balance-Leahy Scale: Poor Standing balance comment: UE support for balance, assisted for hygiene after toileting, pt able to use one hand to assist with donning panties but kept one hand on walker                             Pertinent Vitals/Pain Pain Assessment: Faces Faces Pain Scale: Hurts even more Pain Location: back Pain Descriptors / Indicators: Aching;Sore Pain Intervention(s): Monitored during session;Repositioned;Other (comment) (RN applied lidocane patches)    Home Living Family/patient expects to be discharged to:: Private residence Living Arrangements: Spouse/significant other Available Help at Discharge: Family;Available 24 hours/day Type of Home: House Home Access: Stairs to enter   [employer] of Steps: 2 Home Layout: One level Home Equipment: Cane - single point;Shower seat;Grab bars - tub/shower      Prior Function Level of Independence: Independent         Comments: cooking and cleaning  Hand Dominance        Extremity/Trunk Assessment   Upper Extremity Assessment Upper Extremity Assessment: RUE deficits/detail;LUE deficits/detail RUE Deficits / Details: AROM WFL, strength not formally tested, noted decreased coordination with FNF and  pron/sup RUE Coordination: decreased fine motor LUE Deficits / Details: AROM WFL, strength not formally tested, noted decreased coordination with FNF and pron/sup LUE Coordination: decreased fine motor    Lower Extremity Assessment Lower Extremity Assessment: Generalized weakness    Cervical / Trunk Assessment Cervical / Trunk Assessment: Kyphotic  Communication   Communication: HOH  Cognition Arousal/Alertness: Awake/alert Behavior During Therapy: WFL for tasks assessed/performed Overall Cognitive Status: Impaired/Different from baseline Area of Impairment: Memory                               General Comments: reports not remembering events prior to admission, looks to spouse to remember number of steps at home entry and did not recall grabbar in her shower      General Comments General comments (skin integrity, edema, etc.): spouse in the room and able to assist at d/c; discussed her need for help with mobility on her feet even with walker (reports son getting her one) and assist for showering, etc    Exercises     Assessment/Plan    PT Assessment Patient needs continued PT services  PT Problem List Decreased strength;Decreased mobility;Decreased safety awareness;Decreased balance;Decreased knowledge of use of DME;Decreased activity tolerance;Decreased coordination       PT Treatment Interventions      PT Goals (Current goals can be found in the Care Plan section)  Acute Rehab PT Goals Patient Stated Goal: to go home, return to independent PT Goal Formulation: With patient/family Time For Goal Achievement: 07/08/20 Potential to Achieve Goals: Good    Frequency Min 3X/week   Barriers to discharge        Co-evaluation               AM-PAC PT "6 Clicks" Mobility  Outcome Measure Help needed turning from your back to your side while in a flat bed without using bedrails?: A Little Help needed moving from lying on your back to sitting on the  side of a flat bed without using bedrails?: A Little Help needed moving to and from a bed to a chair (including a wheelchair)?: A Little Help needed standing up from a chair using your arms (e.g., wheelchair or bedside chair)?: A Little Help needed to walk in hospital room?: A Little Help needed climbing 3-5 steps with a railing? : A Lot 6 Click Score: 17    End of Session Equipment Utilized During Treatment: Gait belt Activity Tolerance: Patient limited by fatigue Patient left: with call bell/phone within reach;with family/visitor present;with chair alarm set Nurse Communication: Mobility status PT Visit Diagnosis: Other abnormalities of gait and mobility (R26.89);Muscle weakness (generalized) (M62.81)    Time: 7939-0300 PT Time Calculation (min) (ACUTE ONLY): 33 min   Charges:   PT Evaluation $PT Eval Moderate Complexity: 1 Mod PT Treatments $Gait Training: 8-22 mins        Sheran Lawless, PT Acute Rehabilitation Services Pager:[phone] Office:[phone] 06/24/2020   Elray Mcgregor 06/24/2020, 12:54 PM

## 2020-06-24 NOTE — Evaluation (Signed)
Occupational Therapy Evaluation Patient Details Name: Alyssa Hudson MRN: [id number] DOB: 1941/07/14 Today's Date: 06/24/2020    History of Present Illness Pt is an 79 year old woman with a history of HTN, recurrent shingles, and left facial nerve palsy presenting with with acute encephalopathy after a possible seizure event at home following a week of vomiting and abdominal pain with headache in the context of ongoing shingles.  CXR and MRI 9/24 negative for acute findings.   Clinical Impression   Pt seen by OT for evaluation with husband present in room, currently pt presents with decreased activity tolerance, decreased strength, functional balance deficits and limitations caused by pain. Pt at this time requiring min-mod A for ADL's, and min A for functional self care transfers. Pt with good participation, and fair cognition, noted decreased short term recall, and ?attention but Ox4 and good command following. At this time husband reports himself and son will be available for 24hr S/A as needed, and pt's goal is to return to home. OT will continue to follow acutely, with recommendations listed below.     Follow Up Recommendations  Home health OT;Supervision/Assistance - 24 hour;Other (comment) (home health aide)    Equipment Recommendations  Tub/shower seat    Recommendations for Other Services  RW     Precautions / Restrictions Precautions Precautions: Fall Restrictions Weight Bearing Restrictions: No      Mobility Bed Mobility Overal bed mobility: Needs Assistance Bed Mobility: Supine to Sit;Sit to Supine Rolling: Min assist Sidelying to sit: Min assist Supine to sit: Min assist Sit to supine: Min assist   General bed mobility comments: increased A with Legs to return to bed. fair sequencing   Transfers Overall transfer level: Needs assistance Equipment used: None Transfers: Sit to/from Stand Sit to Stand: Min assist Stand pivot transfers: Min assist General  transfer comment: cues for hand placement, assist for safety and walker management, pt eager to get to [facility] due to needing to urinate    Balance Overall balance assessment: Needs assistance Sitting-balance support: Feet supported;Single extremity supported Sitting balance-Leahy Scale: Poor Sitting balance - Comments: reliance on R UE with decreased attention to L UE    Standing balance support: Bilateral upper extremity supported;During functional activity;Single extremity supported Standing balance-Leahy Scale: Poor Standing balance comment: UE support for balance, assisted for hygiene after toileting, pt able to use one hand to assist with donning panties but kept one hand on walker    ADL either performed or assessed with clinical judgement   ADL Overall ADL's : Needs assistance/impaired                 Upper Body Dressing : Minimal assistance   Lower Body Dressing: Moderate assistance (Mod sitting/standing)   Toilet Transfer: Minimal assistance   Toileting- Clothing Manipulation and Hygiene: Minimal assistance;Cueing for safety               Vision Baseline Vision/History: No visual deficits Vision Assessment?: No apparent visual deficits     Perception Perception Perception Tested?: No   Praxis      Pertinent Vitals/Pain Pain Assessment: Faces Faces Pain Scale: Hurts a little bit Pain Location: back Pain Descriptors / Indicators: Aching;Sore Pain Intervention(s): Limited activity within patient's tolerance;Repositioned     Hand Dominance     Extremity/Trunk Assessment Upper Extremity Assessment Upper Extremity Assessment: Generalized weakness;LUE deficits/detail RUE Deficits / Details: AROM WFL, strength not formally tested, noted decreased coordination with FNF and pron/sup RUE Coordination: WNL LUE Deficits / Details: AROM WFL, presenting  with mild drift with sustained shoulder flexion, mild strength deficits noted LUE Sensation: WNL LUE  Coordination: decreased fine motor;decreased gross motor   Lower Extremity Assessment Lower Extremity Assessment: Defer to PT evaluation   Cervical / Trunk Assessment Cervical / Trunk Assessment: Kyphotic   Communication Communication Communication: HOH   Cognition Arousal/Alertness: Awake/alert Behavior During Therapy: WFL for tasks assessed/performed Overall Cognitive Status: Impaired/Different from baseline Area of Impairment: Memory;Attention;Safety/judgement                     Memory: Decreased short-term memory   Safety/Judgement: Decreased awareness of deficits         General Comments  spouse in the room and able to assist at d/c; discussed her need for help with mobility on her feet even with walker (reports son getting her one) and assist for showering, etc    Exercises     Shoulder Instructions      Home Living Family/patient expects to be discharged to:: Private residence Living Arrangements: Spouse/significant other Available Help at Discharge: Family;Available 24 hours/day Type of Home: House Home Access: Stairs to enter [employer] of Steps: 1 Entrance Stairs-Rails: None Home Layout: One level     Bathroom Shower/Tub: Producer, television/film/video: Handicapped height     Home Equipment: Cane - single point;Shower seat;Grab bars - tub/shower          Prior Functioning/Environment Level of Independence: Independent        Comments: cooking and cleaning        OT Problem List: Decreased strength;Decreased activity tolerance;Impaired balance (sitting and/or standing);Pain;Decreased knowledge of use of DME or AE      OT Treatment/Interventions: Therapeutic exercise;Self-care/ADL training;Neuromuscular education;DME and/or AE instruction;Therapeutic activities;Balance training;Patient/family education    OT Goals(Current goals can be found in the care plan section) Acute Rehab OT Goals Patient Stated Goal: To get home   OT Goal Formulation: With patient/family Time For Goal Achievement: 07/08/20 Potential to Achieve Goals: Good ADL Goals Pt Will Perform Upper Body Dressing: with modified independence Pt Will Perform Lower Body Dressing: with modified independence Pt Will Transfer to Toilet: with modified independence Pt Will Perform Toileting - Clothing Manipulation and hygiene: with modified independence  OT Frequency: Min 2X/week   Barriers to D/C:            Co-evaluation              AM-PAC OT "6 Clicks" Daily Activity     Outcome Measure Help from another person eating meals?: None Help from another person taking care of personal grooming?: None Help from another person toileting, which includes using toliet, bedpan, or urinal?: A Little Help from another person bathing (including washing, rinsing, drying)?: A Lot Help from another person to put on and taking off regular upper body clothing?: A Little Help from another person to put on and taking off regular lower body clothing?: A Lot 6 Click Score: 18   End of Session    Activity Tolerance: Patient limited by fatigue;Patient limited by pain Patient left: in bed;with bed alarm set;with call bell/phone within reach;with family/visitor present  OT Visit Diagnosis: Unsteadiness on feet (R26.81);Pain                Time: 1402-1420 OT Time Calculation (min): 18 min Charges:  OT General Charges $OT Visit: 1 Visit OT Evaluation $OT Eval Moderate Complexity: 1 Mod  Boen Sterbenz OTR/L acute rehab services Office: [phone]   Wilhemena Durie 06/24/2020, 3:49  PM

## 2020-06-24 NOTE — Progress Notes (Addendum)
Subjective:   Alyssa Hudson was evaluated at bedside this morning, no acute events overnight. She reports she is doing well overall, and continues to feel more like herself. Endorses that she wants to get up and walk around. She continues to deny abdominal pain, and pain with urination. When questioned about her history of urinary issues, she describes as "sometimes at home I cannot make it to the bathroom" and also describes some urine leaking, but this does not happen all the time. This is the first time she has needed catheterization.     Objective:  Vital signs in last 24 hours: Vitals:   06/24/20 0006 06/24/20 0341 06/24/20 0723 06/24/20 1031  BP: (!) 167/76 (!) 164/68 (!) 184/83 (!) 159/74  Pulse: 78 77 76 84  Resp: 16 17 18    Temp: 97.7 F (36.5 C) 97.8 F (36.6 C) 98.3 F (36.8 C)   TempSrc: Oral  Oral   SpO2: 95% 95% 96%   Weight:      Height:       PHYSICAL EXAMINATION:  General Appearance: The patient is a well-developed, well-nourished, in no acute distress.  HEENT: Extraocular movements are intact. Pupils are round and reactive to light. Conjunctivae are pink and moist. Sclerae are white and nonicteric.  Mouth: Oral mucosa is pink and moist. Dentition is good.  Lungs: Clear to auscultation bilaterally. There are no crackles, wheezes or rhonchi noted. There is no crepitus on palpation.  Heart: Regular rate and rhythm, normal S1/S2. No murmurs are noted. There are no lifts, heaves or thrills noted on palpation.    MSK: There is no clubbing, cyanosis or edema. Motor and strength is 5/5 in upper and lower extremities.  Neuro: Left facial droop, chronic. The patient is oriented to person, place, and year today. Was able to perform mathematic calculations  Psych: Patient was pleasant and cooperative. No agitation today  Assessment/Plan:  Principal Problem:   Encephalopathy Active Problems:   Hyponatremia  # Acute Encephalopathy Patient developed seizure-like symptoms  acutely the morning of 06/19/20 following severe headache and abdominal pain, in the setting of acute shingles infection. CT head without contrast showed small vessel disease and an equivocal asymmetric increased density of the Right MCA, without acute disease process.06/21/20 MRI brain without contrast showed chronic lacunar infarcts vs. perivascular spaces in the bilateral BG and chronic lacunar infarct of the right caudate with tiny infarcts of bilateral cerebellar hemispheres, but no acute intracranial abnormality. EEG showed cortical dysfunction of the R temporal region and moderate diffuse encephalopathy possibly consistent with post-ictal state. 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 Patient had been in severe pain but had stopped taking her home Valtrex with 4 remaining doses prior to admission. May be contributing factor to encephalitis. - LP showed normal glucose, mildly elevated WBC, mildly elevated RBC, lymphocyte predominance, and increased protein suggestive of viral infection  - Patient improving clinically on empiric Acyclovir as above - Monitor for improvement in rash and mental status   # Hypotonic, Hypochloremic Hyponatremia  Initial sodium 123, chloride 86,  Serum osms 259, Urine osms and urine sodium inappropriately elevated at 505 and 75, respectively. Patient is on HCTZ 25mg  and Losartan 100mg  daily at home. Patient appears euvolemic today.  Creatinine and BUN downtrending. Possibly due to a combined picture of SIADH and home medications HCTZ and Losartan. No ECHO on file although CT abdomen/pelvis showed possible cirrhosis, mild hepatic steatosis.  - Patient passed bedside swallow evaluation, on regular diet - hyponatremia improved, stable at 140 this morning - Check BMPs   # AKI # UTI with possible pyelonephritis BUN on admission was 14, trended up to 27. Cr was 0.72, trended up to 2.6. Possibly 2/2 acyclovir, but due to the need for coverage of VZV and HSV encephalitis. U/A showed cloudy urine with large leukocytes, negative nitrites, small hemoglobinuria, no bacteria, >50 WBC, 11-20 RBC and mucus. Triple phosphate crystals present. CT abdomen/pelvis without contrast showed perinephric edema but no nephrolithiasis. Bladder distended with air-fluid level. Patient afebrile, initial WBC 18.1. Denies associated symptoms at this time.  - Urine culture grew Klebsiella Pneumoniae and Proteus Vulgaris  - sensitivities showed Proteus resistance to Augmentin, switched to ceftazidime 1g q24 on 06/22/20 with end date around 06/26/20(7d total) - Will check bladder scan q4 hours with I&O cath if >300cc's   -we will continue the Acyclovir 800mg  q24.  -BUN/Cr decreased to 17/0.94 -BMP as above  # Diverticulosis with possible acute Diverticulitis  Patient has had nausea, vomiting, decreased PO intake, and intermittent severe abdominal pain per patient's husband with benign physical exam aside from moderate abdominal distention. Patient afebrile. CT abdomen pelvis showed colonic diverticulosis with mild pericolonic fat stranding involving the junction of the descending and sigmoid colon in the region of multiple diverticula, suspicious for acute diverticulitis without perforation or abscess.  - D/C 'd flagyl  On 06/24/20 - Continue to monitor daily CBC, vitals   # Hepatomegaly with Borderline Hepatic Steatosis   Husband notes no alcohol history,  ETOH < 10, urine tox negative. HIV negative, no known history of cirrhosis. Likely due to NASH given HTN, pre-diabetes, patient is overweight. Albumin 3.1, no thrombocytopenia. - Hep C Ab non reactive - PT/INR within normal limits, likely not cirrhosis  # Mild Splenomegaly Platelets 271 without thrombocytopenia. WBC 18.1 with neutrophil predominance, though monocyte count 3.1. Unclear etiology. - No follow up at this time   # History of HTN Patient takes HCTZ 25mg , Losartan 100mg , and Metoprolol succinate 100mg  daily. It is unclear if patient has been taking her amlodipine 5mg  daily at home.  - Continue Norvasc to 10 qD and home metoprolol 100mg  PO qD. Missed her Norvasc dose yesterday, if BP continues to be elevated tomorrow we will consider other antihypertensives -Holding HCTZ and Losartan - Patient may benefit outpatient from lisinopril if microalbuminuria continues   # Pre-Diabetes Hgb A1c 6.1 this admission. Patient not on any glucose lowering agents with no known hx DM.  - Sugars may be elevated in recent inflammation  - Continue to monitor daily BG     Prior to Admission Living Arrangement: Home Anticipated Discharge Location: Home vs. SNF Barriers to Discharge: Acute encephalopathy, possible infectious etiologies   , Medical Student 06/24/2020, 3:35 PM Pager: [phone] After 5pm on weekdays and 1pm on weekends: On Call pager [phone]

## 2020-06-25 DIAGNOSIS — R339 Retention of urine, unspecified: Secondary | ICD-10-CM

## 2020-06-25 LAB — CBC WITH DIFFERENTIAL/PLATELET
Abs Immature Granulocytes: 0.14 10*3/uL — ABNORMAL HIGH (ref 0.00–0.07)
Basophils Absolute: 0 10*3/uL (ref 0.0–0.1)
Basophils Relative: 0 %
Eosinophils Absolute: 0.2 10*3/uL (ref 0.0–0.5)
Eosinophils Relative: 2 %
HCT: 37.5 % (ref 36.0–46.0)
Hemoglobin: 11.9 g/dL — ABNORMAL LOW (ref 12.0–15.0)
Immature Granulocytes: 2 %
Lymphocytes Relative: 25 %
Lymphs Abs: 2.3 10*3/uL (ref 0.7–4.0)
MCH: 26.9 pg (ref 26.0–34.0)
MCHC: 31.7 g/dL (ref 30.0–36.0)
MCV: 84.8 fL (ref 80.0–100.0)
Monocytes Absolute: 2.2 10*3/uL — ABNORMAL HIGH (ref 0.1–1.0)
Monocytes Relative: 23 %
Neutro Abs: 4.5 10*3/uL (ref 1.7–7.7)
Neutrophils Relative %: 48 %
Platelets: 230 10*3/uL (ref 150–400)
RBC: 4.42 MIL/uL (ref 3.87–5.11)
RDW: 14.4 % (ref 11.5–15.5)
WBC: 9.3 10*3/uL (ref 4.0–10.5)
nRBC: 0 % (ref 0.0–0.2)

## 2020-06-25 LAB — COMPREHENSIVE METABOLIC PANEL
ALT: 11 U/L (ref 0–44)
AST: 10 U/L — ABNORMAL LOW (ref 15–41)
Albumin: 2.6 g/dL — ABNORMAL LOW (ref 3.5–5.0)
Alkaline Phosphatase: 54 U/L (ref 38–126)
Anion gap: 13 (ref 5–15)
BUN: 14 mg/dL (ref 8–23)
CO2: 27 mmol/L (ref 22–32)
Calcium: 8.7 mg/dL — ABNORMAL LOW (ref 8.9–10.3)
Chloride: 100 mmol/L (ref 98–111)
Creatinine, Ser: 0.74 mg/dL (ref 0.44–1.00)
GFR calc Af Amer: >60 mL/min (ref >60)
GFR calc non Af Amer: >60 mL/min (ref >60)
Glucose, Bld: 124 mg/dL — ABNORMAL HIGH (ref 70–99)
Potassium: 3.1 mmol/L — ABNORMAL LOW (ref 3.5–5.1)
Sodium: 140 mmol/L (ref 135–145)
Total Bilirubin: 0.9 mg/dL (ref 0.3–1.2)
Total Protein: 6.1 g/dL — ABNORMAL LOW (ref 6.5–8.1)

## 2020-06-25 LAB — PATHOLOGIST SMEAR REVIEW

## 2020-06-25 LAB — ROCKY MTN SPOTTED FVR ABS PNL(IGG+IGM)
RMSF IgG: NEGATIVE
RMSF IgM: 0.36 index (ref 0.00–0.89)

## 2020-06-25 LAB — VDRL, CSF: VDRL Quant, CSF: NONREACTIVE

## 2020-06-25 LAB — VARICELLA-ZOSTER BY PCR: Varicella-Zoster, PCR: NEGATIVE

## 2020-06-25 MED ORDER — LOSARTAN POTASSIUM 50 MG PO TABS
100.0000 mg | ORAL_TABLET | Freq: Every day | ORAL | Status: DC
  Administered 2020-06-25 – 2020-07-01 (×7): 100 mg via ORAL
  Filled 2020-06-25 (×7): qty 2

## 2020-06-25 MED ORDER — HYDROCHLOROTHIAZIDE 25 MG PO TABS
25.0000 mg | ORAL_TABLET | Freq: Every day | ORAL | Status: DC
  Administered 2020-06-25 – 2020-06-29 (×5): 25 mg via ORAL
  Filled 2020-06-25 (×5): qty 1

## 2020-06-25 MED ORDER — DOXYCYCLINE HYCLATE 100 MG PO TABS
100.0000 mg | ORAL_TABLET | Freq: Two times a day (BID) | ORAL | Status: DC
  Administered 2020-06-25 – 2020-06-26 (×3): 100 mg via ORAL
  Filled 2020-06-25 (×3): qty 1

## 2020-06-25 MED ORDER — DEXTROSE 5 % IV SOLN
800.0000 mg | Freq: Three times a day (TID) | INTRAVENOUS | Status: DC
  Administered 2020-06-25 – 2020-06-27 (×5): 800 mg via INTRAVENOUS
  Filled 2020-06-25 (×7): qty 16

## 2020-06-25 MED ORDER — SODIUM CHLORIDE 0.9 % IV SOLN
1.0000 g | Freq: Three times a day (TID) | INTRAVENOUS | Status: AC
  Administered 2020-06-25 – 2020-06-26 (×4): 1 g via INTRAVENOUS
  Filled 2020-06-25 (×4): qty 1

## 2020-06-25 MED ORDER — POTASSIUM CHLORIDE 20 MEQ PO PACK
40.0000 meq | PACK | Freq: Two times a day (BID) | ORAL | Status: AC
  Administered 2020-06-25 (×2): 40 meq via ORAL
  Filled 2020-06-25 (×2): qty 2

## 2020-06-25 NOTE — Progress Notes (Signed)
Physical Therapy Treatment Patient Details Name: Alyssa Hudson MRN: [id number] DOB: 1941-08-04 Today's Date: 06/25/2020    History of Present Illness Pt is an 79 year old woman with a history of HTN, recurrent shingles, and left facial nerve palsy presenting with with acute encephalopathy after a possible seizure event at home following a week of vomiting and abdominal pain with headache in the context of ongoing shingles.  CXR and MRI 9/24 negative for acute findings.    PT Comments    Patient was eager to move and get OOB this date, but displayed endurance deficits as she would get SOB after ambulating ~100 ft with a FWW or after navigating 2 stairs, resulting in her requiring a rest break but SpO2 remained WNL. Pt displayed deficits with avoiding obstacles on her L side when ambulating, in which she was educated to scan her surroundings. She required B UE support when descending stairs with unsteadiness noted, thus educated pt and husband on safe stair negotiation to plan for anticipated d/c home. She continues to display LE strength, endurance, safety, and balance deficits and will continue to benefit from skilled PT to address these deficits to maximize independence and safety with all functional mobility.     Follow Up Recommendations  Home health PT;Supervision/Assistance - 24 hour [facility])     Equipment Recommendations  Rolling walker with 5" wheels    Recommendations for Other Services       Precautions / Restrictions Precautions Precautions: Fall Restrictions Weight Bearing Restrictions: No    Mobility  Bed Mobility Overal bed mobility: Needs Assistance Bed Mobility: Supine to Sit     Supine to sit: Min guard (bed flat)     General bed mobility comments: Bed flat, requiring VC's to bring LEs to EOB and ascend trunk through pushing through B UEs, utilizing R bed rails.  Transfers Overall transfer level: Needs assistance Equipment used: Rolling walker (2  wheeled) Transfers: Sit to/from Stand Sit to Stand: Min guard         General transfer comment: VC's provided for hand placement prior to coming to stand and when returning to sit, with good carryover noted after several reps.  Ambulation/Gait Ambulation/Gait assistance: Min guard Gait Distance (Feet): 100 Feet (x2 bouts with seated rest break between bouts) Assistive device: Rolling walker (2 wheeled) Gait Pattern/deviations: Decreased stride length;Decreased step length - right;Decreased stance time - left   Gait velocity interpretation: 1.31 - 2.62 ft/sec, indicative of limited community ambulator General Gait Details: VC's to maintain proximal to FWW, scan surroundings on L to avoid obstacles (hit 3 with L side of FWW), and to stand upright by retracting shoulders, with momentary success. VC's to coordinate FWW when turning. No LOB noted.   Stairs Stairs: Yes Stairs assistance: Min assist Stair Management: One rail Right;One rail Left;Two rails;Step to pattern Number of Stairs: 2 (x2 bouts with seated rest break between bouts) General stair comments: On first bout, pt utilized R hand rail ascending but B hand rails descending with step-to pattern, no LOB. During 2nd bout, pt utilized R hand rail ascending and L hand rail descending with RUE HHA with step-to pattern, no LOB. Educated pt and husband on safe techniques for stair negotiation. SpO2 >/=97% during rest break between bouts, with noted SOB.   Wheelchair Mobility    Modified Rankin (Stroke Patients Only)       Balance Overall balance assessment: Mild deficits observed, not formally tested  Cognition Arousal/Alertness: Awake/alert Behavior During Therapy: WFL for tasks assessed/performed Overall Cognitive Status: Impaired/Different from baseline Area of Impairment: Memory;Attention;Safety/judgement                   Current Attention Level:  Sustained Memory: Decreased short-term memory   Safety/Judgement: Decreased awareness of safety;Decreased awareness of deficits     General Comments: Required repeated cues to scan surroundings on the L with mobility to avoid collisions with obstacles, with poor recall.      Exercises      General Comments        Pertinent Vitals/Pain Pain Assessment: No/denies pain Pain Intervention(s): Monitored during session    Home Living                      Prior Function            PT Goals (current goals can now be found in the care plan section) Acute Rehab PT Goals Patient Stated Goal: To walk and go home PT Goal Formulation: With patient/family Time For Goal Achievement: 07/08/20 Potential to Achieve Goals: Good Progress towards PT goals: Progressing toward goals    Frequency    Min 3X/week      PT Plan Current plan remains appropriate    Co-evaluation              AM-PAC PT "6 Clicks" Mobility   Outcome Measure  Help needed turning from your back to your side while in a flat bed without using bedrails?: A Little Help needed moving from lying on your back to sitting on the side of a flat bed without using bedrails?: A Little Help needed moving to and from a bed to a chair (including a wheelchair)?: A Little Help needed standing up from a chair using your arms (e.g., wheelchair or bedside chair)?: A Little Help needed to walk in hospital room?: A Little Help needed climbing 3-5 steps with a railing? : A Little 6 Click Score: 18    End of Session Equipment Utilized During Treatment: Gait belt Activity Tolerance: Patient tolerated treatment well;Patient limited by fatigue Patient left: in chair;with call bell/phone within reach;with family/visitor present Nurse Communication: Mobility status PT Visit Diagnosis: Unsteadiness on feet (R26.81);Other abnormalities of gait and mobility (R26.89);Muscle weakness (generalized) (M62.81);Difficulty in  walking, not elsewhere classified (R26.2)     Time: 4259-5638 PT Time Calculation (min) (ACUTE ONLY): 34 min  Charges:  $Gait Training: 8-22 mins $Therapeutic Activity: 8-22 mins                     Raymond Gurney, PT, DPT Acute Rehabilitation Services  Pager: [phone] Office: [phone]    Jewel Baize 06/25/2020, 2:48 PM

## 2020-06-25 NOTE — Progress Notes (Signed)
Pharmacy Antibiotic Note  Alyssa Hudson is a 79 y.o. female admitted on 06/19/2020 presenting as code stroke, known shingles infection and concern for related encephalopathy.  Pharmacy has been consulted for acyclovir dosing.  Pt not following commands at this time and not a good PO candidate for tx.    Will dose 10mg /kg based on actual body weight since BMI <30.   Renal function has improved to CrCl >50, therefore will change acyclovir and ceftazidime to q8h  Plan: Acyclovir 800 mg IV changed to every 8 hours based on CrCl 58 mL/min  Change ceftazidime to q8h for CrCl ~58 Monitor renal function, encephalopathy workup, ability to transition to PO  Height: 5\' 5"  (165.1 cm) Weight: 81.6 kg (180 lb) IBW/kg (Calculated) : 57  Temp (24hrs), Avg:98.3 F (36.8 C), Min:98 F (36.7 C), Max:98.5 F (36.9 C)  Recent Labs  Lab 06/21/20 0557 06/21/20 1423 06/22/20 0332 06/22/20 0826 06/22/20 1607 06/23/20 0403 06/23/20 0440 06/23/20 1917 06/24/20 0622 06/25/20 0730  WBC 13.9*  --  16.9*  --   --  14.0*  --   --  11.0* 9.3  CREATININE 2.69*   < > 1.98*   < > 1.62*  --  1.32* 1.11* 0.94 0.74   < > = values in this interval not displayed.    Estimated Creatinine Clearance: 58.2 mL/min (by C-G formula based on SCr of 0.74 mg/dL).    Allergies  Allergen Reactions  . Codeine Nausea Only and Nausea And Vomiting    Insomnia  . Contrast Media [Iodinated Diagnostic Agents] Other (See Comments)    Turned red all over  . Iodine-131 Hives and Swelling  . Sulfa Antibiotics Nausea Only    Thresa Dozier A. 06/26/20, PharmD, BCPS, FNKF Clinical Pharmacist [place] Please utilize Amion for appropriate phone number to reach the unit pharmacist [facility])   06/25/2020 3:17 PM

## 2020-06-25 NOTE — Progress Notes (Addendum)
Subjective:   [name] was evaluated at bedside this morning, no acute events overnight. She reports she is doing well overall, and continues to feel more like herself. She continues to deny abdominal pain, and pain with urination. She endorses the feeling of a full bladder, but she is not able to pass urine. She does not report fevers or chills. She also endorses a painful rash on the left forearm which appeared today, and woke her up from sleep this morning.   Objective:  Vital signs in last 24 hours: Vitals:   06/24/20 2313 06/25/20 0316 06/25/20 0900 06/25/20 1200  BP: (!) 179/75 (!) 178/70 (!) 171/65 (!) 149/68  Pulse: 81 78 81 81  Resp: 18 19 19  (!) 22  Temp: 98.3 F (36.8 C) 98 F (36.7 C) 98.3 F (36.8 C) 98.5 F (36.9 C)  TempSrc: Oral Oral Oral Oral  SpO2: 95% 97% 97% 97%  Weight:      Height:       PHYSICAL EXAMINATION:  General Appearance: The patient is a well-developed, well-nourished, in no acute distress.  HEENT: Conjunctivae are pink and moist. Sclerae are white and nonicteric.  Mouth: Oral mucosa is pink and moist. Dentition is good.  Lungs: Clear to auscultation bilaterally. There are no crackles, wheezes or rhonchi noted. Heart: Regular rate and rhythm, normal S1/S2. No murmurs are noted. MSK: 12 x 8 cm area of blanching erythema on left forearm  Neuro: Left facial droop, chronic. The patient is oriented to person, place, and year today. Was able to perform mathematic calculations  Psych: Patient was pleasant and cooperative. No agitation today  Assessment/Plan:  Principal Problem:   Encephalopathy Active Problems:   Hyponatremia  # Acute Encephalopathy Patient developed seizure-like symptoms acutely the morning of 06/19/20 following severe headache and abdominal pain, in the setting of acute shingles infection. CT head without contrast showed small vessel disease and an equivocal asymmetric increased density of the Right MCA, without acute disease  process.06/21/20 MRI brain without contrast showed chronic lacunar infarcts vs. perivascular spaces in the bilateral BG and chronic lacunar infarct of the right caudate with tiny infarcts of bilateral cerebellar hemispheres, but no acute intracranial abnormality. EEG showed cortical dysfunction of the R temporal region and moderate diffuse encephalopathy possibly consistent with post-ictal state. 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 Patient had been in severe pain but had stopped taking her home Valtrex with 4 remaining doses prior to admission. May be contributing factor to encephalitis. -CSF from 06/23/20 normal glucose, mildly elevated WBC, mildly elevated RBC, lymphocyte predominance, and increased protein suggestive of viral infection  -CSF PCR results pending for VZV and HSV - Patient improving clinically on empiric Acyclovir as above - Monitor for improvement in rash and mental status   # UTI with possible pyelonephritis BUN on admission was 14, trended up to 27. Cr was 0.72, trended up to 2.6. Possibly 2/2 acyclovir, but due to the need for coverage of VZV and HSV encephalitis. U/A showed cloudy urine with large leukocytes, negative nitrites, small hemoglobinuria, no  bacteria, >50 WBC, 11-20 RBC and mucus. Triple phosphate crystals present. CT abdomen/pelvis without contrast showed perinephric edema but no nephrolithiasis. Bladder distended with air-fluid  level. Patient afebrile, initial WBC 18.1. Denies associated symptoms at this time.  - Urine culture grew Klebsiella Pneumoniae and Proteus Vulgaris  - sensitivities showed Proteus resistance to Augmentin, switched to ceftazidime 1g q24 on 06/22/20 with end date around 06/26/20(7d total) -Continue bladder scans q4 hours with I&O cath if >300cc's   -we will continue the Acyclovir 800mg  q24.  -BUN/Cr consistently WNL -BMP as above  #Urinary retention Patient with a history consistent with a mixed picture of urge and overflow incontinence is now having complete overflow incontinence requires I/O cath multiple times per day -Continue bladder scans q4 hours with I&O cath if >300cc's  # Hypotonic, Hypochloremic Hyponatremia - resolved Initial sodium 123, chloride 86, Serum osms 259, Urine osms and urine sodium inappropriately elevated at 505 and 75, respectively. Patient is on HCTZ 25mg  and Losartan 100mg  daily at home. Patient appears euvolemic today. Creatinine and BUN downtrending. Possibly due to a combined picture of SIADH and home medications HCTZ and Losartan. No ECHO on file although CT abdomen/pelvis showed possible cirrhosis, mild hepatic steatosis.  - Patient passed bedside swallow evaluation, on regular diet - hyponatremia improved, stable at 140 this morning - Check BMPs   # Diverticulosis with possible acute Diverticulitis  Patient has had nausea, vomiting, decreased PO intake, and intermittent severe abdominal pain per patient's husband with benign physical exam aside from moderate abdominal distention. Patient afebrile. CT abdomen pelvis showed colonic diverticulosis with mild pericolonic fat stranding involving the junction of the descending and sigmoid colon in the region of multiple diverticula, suspicious for acute diverticulitis without perforation or abscess.  - D/C 'd flagyl  On 06/24/20 - Continue to monitor daily CBC, vitals   # AKI - resolved -BUN and CR continuing to be  WNL  # HTN Home meds include HCTZ 25mg , Losartan 100mg , and Metoprolol succinate 100mg  daily  - Continue Norvasc to 10 qD and home metoprolol 100mg  PO qD.  - Start home med losartan 100 - Patient may benefit outpatient from lisinopril if microalbuminuria continues   # Hepatomegaly with Borderline Hepatic Steatosis   Husband notes no alcohol history, ETOH < 10, urine tox negative. HIV negative, no known history of cirrhosis. Likely due to NASH given HTN, pre-diabetes, patient is overweight. Albumin 3.1, no thrombocytopenia. - Hep C Ab non reactive - PT/INR within normal limits, likely not cirrhosis  # Mild Splenomegaly Platelets 271 without thrombocytopenia. WBC 18.1 with neutrophil predominance, though monocyte count 3.1. Unclear etiology. - No follow up at this time   # Pre-Diabetes Hgb A1c 6.1 this admission. Patient not on any glucose lowering agents with no known hx DM.  - Sugars may be elevated in recent inflammation  - Continue to monitor daily BG     Prior to Admission Living Arrangement: Home Anticipated Discharge Location: Home vs. SNF Barriers to Discharge: Acute encephalopathy, possible infectious etiologies   , Medical Student 06/25/2020, 2:52 PM Pager: [phone] After 5pm on weekdays and 1pm on weekends: On Call pager [phone]

## 2020-06-26 LAB — CBC WITH DIFFERENTIAL/PLATELET
Abs Immature Granulocytes: 0.24 10*3/uL — ABNORMAL HIGH (ref 0.00–0.07)
Basophils Absolute: 0.1 10*3/uL (ref 0.0–0.1)
Basophils Relative: 1 %
Eosinophils Absolute: 0.2 10*3/uL (ref 0.0–0.5)
Eosinophils Relative: 2 %
HCT: 36 % (ref 36.0–46.0)
Hemoglobin: 11.3 g/dL — ABNORMAL LOW (ref 12.0–15.0)
Immature Granulocytes: 2 %
Lymphocytes Relative: 27 %
Lymphs Abs: 2.8 10*3/uL (ref 0.7–4.0)
MCH: 26.8 pg (ref 26.0–34.0)
MCHC: 31.4 g/dL (ref 30.0–36.0)
MCV: 85.5 fL (ref 80.0–100.0)
Monocytes Absolute: 2.3 10*3/uL — ABNORMAL HIGH (ref 0.1–1.0)
Monocytes Relative: 22 %
Neutro Abs: 4.7 10*3/uL (ref 1.7–7.7)
Neutrophils Relative %: 46 %
Platelets: 193 10*3/uL (ref 150–400)
RBC: 4.21 MIL/uL (ref 3.87–5.11)
RDW: 14.4 % (ref 11.5–15.5)
WBC: 10.3 10*3/uL (ref 4.0–10.5)
nRBC: 0 % (ref 0.0–0.2)

## 2020-06-26 LAB — LYME DISEASE DNA BY PCR(BORRELIA BURG): Lyme Disease(B.burgdorferi)PCR: NEGATIVE

## 2020-06-26 LAB — COMPREHENSIVE METABOLIC PANEL
ALT: 12 U/L (ref 0–44)
AST: 10 U/L — ABNORMAL LOW (ref 15–41)
Albumin: 2.5 g/dL — ABNORMAL LOW (ref 3.5–5.0)
Alkaline Phosphatase: 51 U/L (ref 38–126)
Anion gap: 14 (ref 5–15)
BUN: 16 mg/dL (ref 8–23)
CO2: 22 mmol/L (ref 22–32)
Calcium: 8.4 mg/dL — ABNORMAL LOW (ref 8.9–10.3)
Chloride: 101 mmol/L (ref 98–111)
Creatinine, Ser: 0.7 mg/dL (ref 0.44–1.00)
GFR calc Af Amer: >60 mL/min (ref >60)
GFR calc non Af Amer: >60 mL/min (ref >60)
Glucose, Bld: 111 mg/dL — ABNORMAL HIGH (ref 70–99)
Potassium: 3.9 mmol/L (ref 3.5–5.1)
Sodium: 137 mmol/L (ref 135–145)
Total Bilirubin: 1 mg/dL (ref 0.3–1.2)
Total Protein: 5.9 g/dL — ABNORMAL LOW (ref 6.5–8.1)

## 2020-06-26 LAB — HSV(HERPES SMPLX VRS)ABS-I+II(IGG)-CSF: HSV Type I/II Ab, IgG CSF: 2.67 IV — ABNORMAL HIGH (ref <=0.89)

## 2020-06-26 NOTE — Progress Notes (Signed)
Physical Therapy Treatment Patient Details Name: Alyssa Hudson MRN: [id number] DOB: [birth year] Today's Date: 06/26/2020    History of Present Illness Pt is an 79 year old woman with a history of HTN, recurrent shingles, and left facial nerve palsy presenting with with acute encephalopathy after a possible seizure event at home following a week of vomiting and abdominal pain with headache in the context of ongoing shingles.  CXR and MRI 9/24 negative for acute findings.    PT Comments    Pt supine in bed on arrival.  She required min to min guard assistance and continues to have difficulty with strength and coordination on the L side.  Pt presents with increased edema to L hand and PTA massaged hand to redirect fluid to midline.  Pt reports hand feeling better.      Follow Up Recommendations  Home health PT;Supervision/Assistance - 24 hour (HH aide)     Equipment Recommendations  Rolling walker with 5" wheels    Recommendations for Other Services       Precautions / Restrictions Precautions Precautions: Fall Restrictions Weight Bearing Restrictions: No    Mobility  Bed Mobility               General bed mobility comments: Pt seated in recliner on arrival.  Transfers Overall transfer level: Needs assistance Equipment used: Rolling walker (2 wheeled) Transfers: Sit to/from Stand Sit to Stand: Min guard Stand pivot transfers: Min guard       General transfer comment: Cues for hand placement to and from seated surface.  Ambulation/Gait Ambulation/Gait assistance: Min guard Gait Distance (Feet): 150 Feet Assistive device: Rolling walker (2 wheeled) Gait Pattern/deviations: Decreased stride length;Decreased step length - right;Decreased stance time - left     General Gait Details: VC's to maintain proximal to RW,  scan surroundings on L to avoid obstacles, and to stand upright by retracting shoulders.   Stairs             Wheelchair Mobility     Modified Rankin (Stroke Patients Only)       Balance Overall balance assessment: Mild deficits observed, not formally tested Sitting-balance support: Feet supported;Single extremity supported Sitting balance-Leahy Scale: Poor Sitting balance - Comments: reliance on R UE with decreased attention to L UE    Standing balance support: Bilateral upper extremity supported;During functional activity;Single extremity supported Standing balance-Leahy Scale: Poor Standing balance comment: UE support for balance, assisted for hygiene after toileting, pt able to use one hand to assist with donning panties but kept one hand on walker                            Cognition Arousal/Alertness: Awake/alert Behavior During Therapy: WFL for tasks assessed/performed Overall Cognitive Status: Impaired/Different from baseline Area of Impairment: Memory;Attention;Safety/judgement                   Current Attention Level: Sustained Memory: Decreased short-term memory   Safety/Judgement: Decreased awareness of safety;Decreased awareness of deficits     General Comments: Required repeated cues to scan surroundings on the L with mobility to avoid collisions with obstacles, including hand dropping from rw with poor recall.      Exercises      General Comments        Pertinent Vitals/Pain Pain Assessment: Faces Faces Pain Scale: Hurts a little bit Pain Location: back and L hand ( edema ) Pain Descriptors / Indicators: Aching;Sore Pain Intervention(s): Monitored during session;Repositioned  Home Living                      Prior Function            PT Goals (current goals can now be found in the care plan section) Acute Rehab PT Goals Patient Stated Goal: To walk and go home Potential to Achieve Goals: Good Progress towards PT goals: Progressing toward goals    Frequency    Min 3X/week      PT Plan Current plan remains appropriate     Co-evaluation              AM-PAC PT "6 Clicks" Mobility   Outcome Measure  Help needed turning from your back to your side while in a flat bed without using bedrails?: A Little Help needed moving from lying on your back to sitting on the side of a flat bed without using bedrails?: A Little Help needed moving to and from a bed to a chair (including a wheelchair)?: A Little Help needed standing up from a chair using your arms (e.g., wheelchair or bedside chair)?: A Little Help needed to walk in hospital room?: A Little Help needed climbing 3-5 steps with a railing? : A Little 6 Click Score: 18    End of Session Equipment Utilized During Treatment: Gait belt Activity Tolerance: Patient tolerated treatment well;Patient limited by fatigue Patient left: in chair;with call bell/phone within reach;with family/visitor present Nurse Communication: Mobility status PT Visit Diagnosis: Unsteadiness on feet (R26.81);Other abnormalities of gait and mobility (R26.89);Muscle weakness (generalized) (M62.81);Difficulty in walking, not elsewhere classified (R26.2)     Time: 7829-5621 PT Time Calculation (min) (ACUTE ONLY): 16 min  Charges:  $Gait Training: 8-22 mins                     Alyssa Hudson , PTA Acute Rehabilitation Services Pager [phone] Office [phone]     Alyssa Hudson Artis Delay 06/26/2020, 3:37 PM

## 2020-06-26 NOTE — Progress Notes (Signed)
Occupational Therapy Treatment Patient Details Name: Collyns Mcquigg MRN: [id number] DOB: [birth year] Today's Date: 06/26/2020    History of present illness Pt is an 79 year old woman with a history of HTN, recurrent shingles, and left facial nerve palsy presenting with with acute encephalopathy after a possible seizure event at home following a week of vomiting and abdominal pain with headache in the context of ongoing shingles.  CXR and MRI 9/24 negative for acute findings.   OT comments  Pt. Seen for skilled OT treatment session.  Pt. Ambulated to the b.room for toileting task with min guard/min a.  Cues required throughout for management of L ue/hand on rw along with scanning to L to avoid obstacles.  Pt. Completed 2 standing grooming tasks. likely delay of remembering to turn the water off when finished washing hands. Noted and corrected with light cueing.  LB dressing min a. Pt. And spouse report he usually assists at home.  Pt. And spouse eager and motivated for safe return home.    Follow Up Recommendations  Home health OT;Supervision/Assistance - 24 hour;Other (comment)    Equipment Recommendations       Recommendations for Other Services      Precautions / Restrictions Precautions Precautions: Fall       Mobility Bed Mobility               General bed mobility comments: standing with PT upon my arrival.  Transfers Overall transfer level: Needs assistance Equipment used: Rolling walker (2 wheeled) Transfers: Sit to/from [employer] Sit to Stand: Min guard Stand pivot transfers: Min assist       General transfer comment: VC's provided for hand placement prior to coming to stand and when returning to sit,  extra cues required for keeping L hand on rolling walker during ambulation and transitional movements    Balance                                           ADL either performed or assessed with clinical judgement   ADL  Overall ADL's : Needs assistance/impaired     Grooming: Wash/dry hands;Brushing hair;Min guard;Standing               Lower Body Dressing: Minimal assistance;Sitting/lateral leans Lower Body Dressing Details (indicate cue type and reason): reports her husband is "the sock helper at home" husband present and reports he was helping prior to admission Toilet Transfer: Minimal assistance;Cueing for sequencing;Cueing for safety;Ambulation;Regular Toilet;Grab bars;RW Statistician Details (indicate cue type and reason): cues for rw management and attention to L side during ambulation. hand dropping off of rw and often did not realize it Toileting- Clothing Manipulation and Hygiene: Sitting/lateral lean;Set up       Functional mobility during ADLs: Min guard;Minimal assistance General ADL Comments: pt.with good standing balance during grooming tasks. incorporated L hand during hand washing and combing hair. un sure if she usually leaves water running as long as she did or if it was forgotten as she was finished washing hands and left water running while she began to comb her hair.   i stated "is there anything else we need to do" once she was finished with her hair, and she said " i guess i need to turn the water off"     Vision       Perception     Praxis  Cognition Arousal/Alertness: Awake/alert Behavior During Therapy: WFL for tasks assessed/performed Overall Cognitive Status: Impaired/Different from baseline Area of Impairment: Memory;Attention;Safety/judgement                   Current Attention Level: Sustained Memory: Decreased short-term memory   Safety/Judgement: Decreased awareness of safety;Decreased awareness of deficits     General Comments: Required repeated cues to scan surroundings on the L with mobility to avoid collisions with obstacles, including hand dropping from rw with poor recall.        Exercises     Shoulder Instructions        General Comments      Pertinent Vitals/ Pain       Pain Assessment: Faces Faces Pain Scale: Hurts a little bit Pain Location: back Pain Descriptors / Indicators: Aching;Sore Pain Intervention(s): Limited activity within patient's tolerance;Monitored during session;RN gave pain meds during session;Other (comment) (applied patches to pt. back)  Home Living                                          Prior Functioning/Environment              Frequency  Min 2X/week        Progress Toward Goals  OT Goals(current goals can now be found in the care plan section)  Progress towards OT goals: Progressing toward goals     Plan      Co-evaluation                 AM-PAC OT "6 Clicks" Daily Activity     Outcome Measure   Help from another person eating meals?: None Help from another person taking care of personal grooming?: None Help from another person toileting, which includes using toliet, bedpan, or urinal?: A Little Help from another person bathing (including washing, rinsing, drying)?: A Lot Help from another person to put on and taking off regular upper body clothing?: A Little Help from another person to put on and taking off regular lower body clothing?: A Lot 6 Click Score: 18    End of Session Equipment Utilized During Treatment: Gait belt;Rolling walker  OT Visit Diagnosis: Unsteadiness on feet (R26.81);Pain   Activity Tolerance Patient tolerated treatment well   Patient Left Other (comment) (pt. left with PTA to begin physical therapy session)   Nurse Communication          Time: 3235-5732 OT Time Calculation (min): 14 min  Charges: OT General Charges $OT Visit: 1 Visit OT Treatments $Self Care/Home Management : 8-22 mins  Boneta Lucks, COTA/L Acute Rehabilitation [phone]   Robet Leu 06/26/2020, 12:29 PM

## 2020-06-26 NOTE — Progress Notes (Addendum)
Subjective:   Ms. Kuk was evaluated at bedside this morning, no acute events overnight.  Patient reports she is feeling more like herself, and she slept well.  She reports the rash on her left arm is no longer painful. She endorses being able to pass a small amount of urine last night, but she was catheterized this morning.  She is happy to be pursuing physical and occupational therapy and knows she needs to regain some strength before she leaves the hospital.  She denies fevers, chills, abdominal pain, pain with urination.   Objective:  Vital signs in last 24 hours: Vitals:   06/25/20 2316 06/26/20 0328 06/26/20 0354 06/26/20 0757  BP: 124/72 (!) 176/79  (!) 162/77  Pulse: 84 77  86  Resp: 17 18  18   Temp: (!) 97.3 F (36.3 C) 97.9 F (36.6 C)  98.2 F (36.8 C)  TempSrc: Oral Oral  Oral  SpO2: 97% 97%  98%  Weight:   74.8 kg   Height:       PHYSICAL EXAMINATION:  General Appearance: The patient is a well-developed, well-nourished, in no acute distress.  HEENT: Conjunctivae are pink and moist. Sclerae are white and nonicteric.  Mouth: Oral mucosa is pink and moist. Dentition is good.  Lungs: Clear to auscultation bilaterally. There are no crackles, wheezes or rhonchi noted. Heart: Regular rate and rhythm, normal S1/S2. No murmurs are noted. MSK: Erythema improved on left forearm Neuro: Left facial droop, chronic. The patient is oriented to person, place, and year today. Was able to perform mathematic calculations  Psych: Patient was pleasant and cooperative. No agitation today  Assessment/Plan:  Principal Problem:   Encephalopathy Active Problems:   Hyponatremia  # Acute Encephalopathy Patient developed seizure-like symptoms acutely the morning of 06/19/20 following severe headache and abdominal pain, in the setting of acute shingles infection. CT head without contrast showed small vessel disease and an equivocal asymmetric increased density of the Right MCA, without  acute disease process.06/21/20 MRI brain without contrast showed chronic lacunar infarcts vs. perivascular spaces in the bilateral BG and chronic lacunar infarct of the right caudate with tiny infarcts of bilateral cerebellar hemispheres, but no acute intracranial abnormality. EEG showed cortical dysfunction of the R temporal region and moderate diffuse encephalopathy possibly consistent with post-ictal state. Differential includes VZV encephalitis versus metabolic encephalopathy 2/2 urinary infection - Empirically treating with acyclovir and doxycycline for VZV encephalitis and tick-borne illness -Finished course of ceftazidime today.  Finish doxycycline course today. -D/C'd Flagyl 06/24/20 - Blood cultures negative  # Active Varicella Zoster Infection Patient's husband states patient was diagnosed with shingles in May of this year, which resolved, but developed recurrence along her left breast/axillary region x 3 weeks. Patient had been in severe pain but had stopped taking her home Valtrex with 4 remaining doses prior to admission. May be contributing factor to encephalitis. -CSF from 06/23/20 normal glucose, mildly elevated WBC, mildly elevated RBC, lymphocyte predominance, and increased protein suggestive of viral infection  -HSV IgG CSF from 06/23/20 resulted positive, not diagnostic of HSV encephalitis, awaiting PCR -CSF PCR results pending for VZV -Per pharmacy her acyclovir 800mg  was switched back to q8 due to improved renal creatinine clearance -Today is day 8 of acyclovir -We plan to switch her to oral Val acyclovir for 4 days after she is discharged, for now continue IV acyclovir as above - Monitor for improvement in rash and mental status   # UTI with possible pyelonephritis BUN on admission was 14, trended  up to 27. Cr was 0.72, trended up to 2.6. Possibly 2/2 acyclovir, but due to the need for coverage of VZV and HSV encephalitis. U/A showed cloudy urine with large leukocytes, negative  nitrites, small hemoglobinuria, no bacteria, >50 WBC, 11-20 RBC and mucus. Triple phosphate crystals present. CT abdomen/pelvis without contrast showed perinephric edema but no nephrolithiasis. Bladder distended with air-fluid level. Patient afebrile, initial WBC 18.1. Denies associated symptoms at this time.  - Urine culture grew Klebsiella Pneumoniae and Proteus Vulgaris, she was treated with 7 days of ceftazidime as above. -Continue bladder scans q4 hours with I&O cath if >300cc's   -BUN/Cr consistently WNL -BMP as above  #Urinary retention Patient with a history consistent with a mixed picture of urge and overflow incontinence developed overflow incontinence requiring I/O cath multiple times per day. She reported being able to pass a small amount of urine last night.  -Continue bladder scans q4 hours with I&O cath if >300cc's  # Hypotonic, Hypochloremic Hyponatremia - resolved Initial sodium 123, chloride 86, Serum osms 259, Urine osms and urine sodium inappropriately elevated at 505 and 75, respectively. Patient is on HCTZ 25mg  and Losartan 100mg  daily at home. Patient appears euvolemic today. Creatinine and BUN downtrending. Possibly due to a combined picture of SIADH and home medications HCTZ and Losartan. No ECHO on file although CT abdomen/pelvis showed possible cirrhosis, mild hepatic steatosis.  - Patient passed bedside swallow evaluation, on regular diet - hyponatremia improved, stable at 140 this morning - Check BMPs   # Diverticulosis with possible acute Diverticulitis  Patient has had nausea, vomiting, decreased PO intake, and intermittent severe abdominal pain per patient's husband with benign physical exam aside from moderate abdominal distention. Patient afebrile. CT abdomen pelvis showed colonic diverticulosis with mild pericolonic fat stranding involving the junction of the descending and sigmoid colon in the region of multiple diverticula, suspicious for acute diverticulitis  without perforation or abscess.  - D/C 'd flagyl  On 06/24/20 - Continue to monitor daily CBC, vitals   # AKI - resolved -BUN and CR continuing to be WNL  # HTN Home meds include HCTZ 25mg , Losartan 100mg , and Metoprolol succinate 100mg  daily  - Continue Norvasc to 10 qD and home metoprolol 100mg  PO qD.  - continue home med losartan 100 - Patient may benefit outpatient from lisinopril if microalbuminuria continues   # Hepatomegaly with Borderline Hepatic Steatosis   Husband notes no alcohol history, ETOH < 10, urine tox negative. HIV negative, no known history of cirrhosis. Likely due to NASH given HTN, pre-diabetes, patient is overweight. Albumin 3.1, no thrombocytopenia. - Hep C Ab non reactive - PT/INR within normal limits, likely not cirrhosis  # Mild Splenomegaly Platelets 271 without thrombocytopenia. WBC 18.1 with neutrophil predominance, though monocyte count 3.1. Unclear etiology. - No follow up at this time   # Pre-Diabetes Hgb A1c 6.1 this admission. Patient not on any glucose lowering agents with no known hx DM.  - Sugars may be elevated in recent inflammation  - Continue to monitor daily BG     Prior to Admission Living Arrangement: Home Anticipated Discharge Location: Home vs. SNF Barriers to Discharge: Acute encephalopathy, possible infectious etiologies   , Medical Student 06/26/2020, 3:16 PM Pager: [phone] After 5pm on weekdays and 1pm on weekends: On Call pager [phone]

## 2020-06-26 NOTE — TOC Initial Note (Signed)
Transition of Care [facility]) - Initial/Assessment Note    Patient Details  Name: Alyssa Hudson MRN: [id number] Date of Birth: [birth year]  Transition of Care [facility]) CM/SW Contact:    Pollie Friar, RN Phone Number: 06/26/2020, 8:26 AM  Clinical Narrative:                 Yesterday CM met with pt and her spouse. CM provided choice for [facility] and [facility] decided on. Tommi Rumps with [facility] accepted the referral.  Spouse states their son has already obtained a walker and rollator for home. Spouse is asking for order for lift chair to assist with the cost and CM provided him the order. Spouse states he can provide needed supervision at home.  TOC following for further d/c needs.  Will need HH orders placed prior to d/c.   Expected Discharge Plan: [place] Barriers to Discharge: Continued Medical Work up   Patient Goals and CMS Choice   CMS Medicare.gov Compare Post Acute Care list provided to:: Patient Choice offered to / list presented to : Patient, Spouse  Expected Discharge Plan and Services Expected Discharge Plan: [place]   Discharge Planning Services: CM Consult Post Acute Care Choice: Durable Medical Equipment Living arrangements for the past 2 months: Single Family Home                 DME Arranged:  (CM provided spouse the orders for lift chair)         HH Arranged: PT, OT, Nurse's Aide [place] Agency: [place] Date [facility] Agency Contacted: 06/25/20   Representative spoke with at [place]: Tommi Rumps  Prior Living Arrangements/Services Living arrangements for the past 2 months: [place] Lives with:: Spouse Patient language and need for interpreter reviewed:: Yes Do you feel safe going back to the place where you live?: Yes      Need for Family Participation in Patient Care: Yes (Comment) Care giver support system in place?: Yes (comment) Current home services: DME (walker and rollator) Criminal Activity/Legal Involvement  Pertinent to Current Situation/Hospitalization: No - Comment as needed  Activities of Daily Living      Permission Sought/Granted                  Emotional Assessment Appearance:: Appears stated age Attitude/Demeanor/Rapport: Engaged Affect (typically observed): Accepting Orientation: : Oriented to Self, Oriented to Place, Oriented to Situation   Psych Involvement: No (comment)  Admission diagnosis:  Encephalitis [G04.90] Hyponatremia [E87.1] Encephalopathy [G93.40] Herpes zoster without complication [H48.8] Patient Active Problem List   Diagnosis Date Noted   Hyponatremia 06/20/2020   Encephalopathy 06/19/2020   PCP:  Nicoletta Dress, MD Pharmacy:   [place], [place] - [address] [place] [address] Phone: [phone] Fax: [phone]  [facility] - [place], [place] [address] Phone: [phone] Fax: [phone]     Social Determinants of Health (SDOH) Interventions    Readmission Risk Interventions No flowsheet data found.

## 2020-06-27 LAB — COMPREHENSIVE METABOLIC PANEL
ALT: 12 U/L (ref 0–44)
AST: 14 U/L — ABNORMAL LOW (ref 15–41)
Albumin: 2.4 g/dL — ABNORMAL LOW (ref 3.5–5.0)
Alkaline Phosphatase: 49 U/L (ref 38–126)
Anion gap: 12 (ref 5–15)
BUN: 20 mg/dL (ref 8–23)
CO2: 25 mmol/L (ref 22–32)
Calcium: 8.3 mg/dL — ABNORMAL LOW (ref 8.9–10.3)
Chloride: 100 mmol/L (ref 98–111)
Creatinine, Ser: 1.01 mg/dL — ABNORMAL HIGH (ref 0.44–1.00)
GFR calc Af Amer: >60 mL/min (ref >60)
GFR calc non Af Amer: 52 mL/min — ABNORMAL LOW (ref >60)
Glucose, Bld: 133 mg/dL — ABNORMAL HIGH (ref 70–99)
Potassium: 3.4 mmol/L — ABNORMAL LOW (ref 3.5–5.1)
Sodium: 137 mmol/L (ref 135–145)
Total Bilirubin: 0.6 mg/dL (ref 0.3–1.2)
Total Protein: 5.8 g/dL — ABNORMAL LOW (ref 6.5–8.1)

## 2020-06-27 LAB — CBC WITH DIFFERENTIAL/PLATELET
Abs Immature Granulocytes: 0.7 10*3/uL — ABNORMAL HIGH (ref 0.00–0.07)
Basophils Absolute: 0 10*3/uL (ref 0.0–0.1)
Basophils Relative: 0 %
Eosinophils Absolute: 0.2 10*3/uL (ref 0.0–0.5)
Eosinophils Relative: 2 %
HCT: 34.9 % — ABNORMAL LOW (ref 36.0–46.0)
Hemoglobin: 11.3 g/dL — ABNORMAL LOW (ref 12.0–15.0)
Immature Granulocytes: 7 %
Lymphocytes Relative: 27 %
Lymphs Abs: 2.6 10*3/uL (ref 0.7–4.0)
MCH: 27.3 pg (ref 26.0–34.0)
MCHC: 32.4 g/dL (ref 30.0–36.0)
MCV: 84.3 fL (ref 80.0–100.0)
Monocytes Absolute: 2 10*3/uL — ABNORMAL HIGH (ref 0.1–1.0)
Monocytes Relative: 22 %
Neutro Abs: 3.9 10*3/uL (ref 1.7–7.7)
Neutrophils Relative %: 42 %
Platelets: 228 10*3/uL (ref 150–400)
RBC: 4.14 MIL/uL (ref 3.87–5.11)
RDW: 14.5 % (ref 11.5–15.5)
WBC: 9.4 10*3/uL (ref 4.0–10.5)
nRBC: 0 % (ref 0.0–0.2)

## 2020-06-27 LAB — CSF CULTURE W GRAM STAIN: Culture: NO GROWTH

## 2020-06-27 LAB — CMV DNA BY PCR, QUALITATIVE: CMV DNA, Qual PCR: NEGATIVE

## 2020-06-27 LAB — VARICELLA-ZOSTER BY PCR: Varicella-Zoster, PCR: NEGATIVE

## 2020-06-27 MED ORDER — SODIUM CHLORIDE 0.9% FLUSH
10.0000 mL | INTRAVENOUS | Status: DC | PRN
  Administered 2020-06-27: 10 mL

## 2020-06-27 MED ORDER — SODIUM CHLORIDE 0.9% FLUSH
10.0000 mL | Freq: Two times a day (BID) | INTRAVENOUS | Status: DC
  Administered 2020-06-27 – 2020-07-01 (×10): 10 mL

## 2020-06-27 MED ORDER — CHLORHEXIDINE GLUCONATE CLOTH 2 % EX PADS
6.0000 | MEDICATED_PAD | Freq: Every day | CUTANEOUS | Status: DC
  Administered 2020-06-27 – 2020-07-01 (×5): 6 via TOPICAL

## 2020-06-27 MED ORDER — DEXTROSE 5 % IV SOLN
800.0000 mg | Freq: Two times a day (BID) | INTRAVENOUS | Status: DC
  Administered 2020-06-27 – 2020-06-28 (×3): 800 mg via INTRAVENOUS
  Filled 2020-06-27 (×5): qty 16

## 2020-06-27 NOTE — Progress Notes (Signed)
Subjective:   Mrs. Bayne was seen and evaluated at bedside this morning. She is accompanied by her husband, Yehuda Mao. She states that she is doing well today. She does state that she is urinating in incremental amounts, but is not able to completely void. She additionally endorses wanting to walk more around the hospital to better condition herself, and to be able to walk to the restroom. We discussed that she is working with PT and that if she would like to ambulate to the restroom that she is a 1-2 assist and will need to notify her nurse. She voiced understanding. All questions and concerns were addressed at bedside.   Objective:  Vital signs in last 24 hours: Vitals:   06/26/20 1245 06/26/20 1957 06/26/20 2315 06/27/20 0334  BP: (!) 172/69 (!) 122/55 124/61 (!) 149/63  Pulse: 75 91 75 91  Resp: 16 17 18 17   Temp: 98.4 F (36.9 C) 97.8 F (36.6 C) 98.6 F (37 C) 98.6 F (37 C)  TempSrc: Oral Oral  Axillary  SpO2: 97% 97% 95% 94%  Weight:      Height:      Physical Exam Constitutional:      General: She is not in acute distress.    Appearance: Normal appearance. She is not ill-appearing or toxic-appearing.     Comments: Sitting comfortably in her chair. NAD. Answers questions appropriately.   HENT:     Head: Normocephalic and atraumatic.     Comments: L sided facial droop Eyes:     General:        Right eye: No discharge.        Left eye: No discharge.     Conjunctiva/sclera: Conjunctivae normal.  Cardiovascular:     Rate and Rhythm: Normal rate and regular rhythm.     Pulses: Normal pulses.     Heart sounds: Normal heart sounds. No murmur heard.  No friction rub. No gallop.   Pulmonary:     Effort: Pulmonary effort is normal.     Breath sounds: Normal breath sounds. No wheezing, rhonchi or rales.  Abdominal:     General: Abdomen is flat. Bowel sounds are normal.     Palpations: Abdomen is soft.     Tenderness: There is no abdominal tenderness. There is no  guarding.  Neurological:     Mental Status: She is alert and oriented to person, place, and time.     Comments: L sided facial droop  Psychiatric:        Behavior: Behavior normal.     Assessment/Plan:  Principal Problem:   Encephalopathy Active Problems:   Hyponatremia  # Acute Encephalopathy Patient developed seizure-like symptoms acutely the morning of 06/19/20 following severe headache and abdominal pain, in the setting of acute shingles infection. CT head without contrast showed small vessel disease and an equivocal asymmetric increased density of the Right MCA, without acute disease process. MRI brain without contrast showed chronic lacunar infarcts vs. perivascular spaces in the bilateral BG and chronic lacunar infarct of the right caudate with tiny infarcts of bilateral cerebellar hemispheres, but no acute intracranial abnormality. EEG showed cortical dysfunction of the R temporal region and moderate diffuse encephalopathy possibly consistent with post-ictal state. Differential includes VZV encephalitis versus metabolic encephalopathy 2/2 urinary infection.  - CMV negative.  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  - IV acyclovir as below  - Blood cultures negative  Active Varicella Zoster Infection Patient's husband states patient was  diagnosed with shingles in May of this year, which resolved, but developed recurrence along her left breast/axillary region x 3 weeks. Patient had been in severe pain but had stopped taking her home Valtrex with 4 remaining doses prior to admission. May be contributing factor to encephalitis. - Acyclovir 800mg  q8h - IV acyclovir day 9. Plan to treat for 10 days and transition to PO prior to discharge for a 14-day course - Monitor for improvement in rash and mental status   UTI with possible pyelonephritis BUN on admission was 14, trended up to 27. Cr was 0.72, trended up to 2.6. Possibly  2/2 acyclovir, but due to the need for coverage of VZV and HSV encephalitis. U/A showed cloudy urine with large leukocytes, negative nitrites, small hemoglobinuria, no bacteria, >50 WBC, 11-20 RBC and mucus. Triple phosphate crystals present. CT abdomen/pelvis without contrast showed perinephric edema but no nephrolithiasis. Bladder distended with air-fluid level. Patient afebrile, initial WBC 18.1. Denies associated symptoms at this time.  - Urine culture grew Klebsiella Pneumoniae and Proteus Vulgaris, she was treated with 7 days of ceftazidime as above. - BMP as above  Urinary retention Patient with a history consistent with a mixed picture of urge and overflow incontinence developed overflow incontinence requiring I/O cath multiple times per day. She has been able to pass a small amount of urine, 100 cc, however shown to retain on bladder scan requiring I&O cath.  - Foley ordered  Hypotonic, Hypochloremic Hyponatremia - resolved Initial sodium 123, chloride 86, Serum osms 259, Urine osms and urine sodium inappropriately elevated at 505 and 75, respectively. Patient is on HCTZ 25mg  and Losartan 100mg  daily at home. Patient appears euvolemic today. Creatinine and BUN downtrending. Possibly due to a combined picture of SIADH and home medications HCTZ and Losartan. No ECHO on file although CT abdomen/pelvis showed possible cirrhosis, mild hepatic steatosis.  - Check BMPs   Diverticulosis with possible acute Diverticulitis  Patient has had nausea, vomiting, decreased PO intake, and intermittent severe abdominal pain per patient's husband with benign physical exam aside from moderate abdominal distention. Patient afebrile. CT abdomen pelvis showed colonic diverticulosis with mild pericolonic fat stranding involving the junction of the descending and sigmoid colon in the region of multiple diverticula, suspicious for acute diverticulitis without perforation or abscess. Flagyl DC'd 9/29 - Daily CBC,  vitals   AKI - resolved - BUN and sCr continuing to be WNL  HTN Home meds include HCTZ 25mg , Losartan 100mg , and Metoprolol succinate 100mg  daily  - Continue Norvasc to 10 qD and home metoprolol 100mg  PO qD.  - continue home med losartan 100 mg daily - Patient may benefit outpatient from lisinopril if microalbuminuria continues   Hepatomegaly with Borderline Hepatic Steatosis   Husband notes no alcohol history, ETOH < 10, urine tox negative. HIV negative, no known history of cirrhosis. Likely due to NASH given HTN, pre-diabetes, patient is overweight. Albumin 3.1, no thrombocytopenia. - Hep C Ab non reactive - PT/INR within normal limits, likely not cirrhosis   Prior to Admission Living Arrangement: Home Anticipated Discharge Location: Home vs. SNF Barriers to Discharge: Acute encephalopathy, possible infectious etiologies   , MD 06/27/2020, 6:44 AM Pager: [phone] After 5pm on weekdays and 1pm on weekends: On Call pager [phone]

## 2020-06-27 NOTE — Progress Notes (Signed)
Physical Therapy Treatment Patient Details Name: Alyssa Hudson MRN: [id number] DOB: 01-04-1941 Today's Date: 06/27/2020    History of Present Illness Pt is an 79 year old woman with a history of HTN, recurrent shingles, and left facial nerve palsy presenting with with acute encephalopathy after a possible seizure event at home following a week of vomiting and abdominal pain with headache in the context of ongoing shingles.  CXR and MRI 9/24 negative for acute findings.    PT Comments    Pt was seen for mobility with request to walk.  Has already attempted stairs, and asks not to have to get OOB.  Follow up with safety and quality of gait, and encourage pt to try to increase independence as tolerated.   Follow Up Recommendations  Home health PT;Supervision/Assistance - 24 hour     Equipment Recommendations  Rolling walker with 5" wheels    Recommendations for Other Services       Precautions / Restrictions Precautions Precautions: Fall Precaution Comments: monitor pulse and sats Restrictions Weight Bearing Restrictions: No    Mobility  Bed Mobility Overal bed mobility: Needs Assistance             General bed mobility comments: declined OOB  Transfers                 General transfer comment: declined  Ambulation/Gait                 Stairs             Wheelchair Mobility    Modified Rankin (Stroke Patients Only)       Balance Overall balance assessment: Needs assistance                                          Cognition Arousal/Alertness: Awake/alert Behavior During Therapy: WFL for tasks assessed/performed Overall Cognitive Status: Impaired/Different from baseline Area of Impairment: Problem solving;Awareness;Safety/judgement;Following commands                   Current Attention Level: Selective Memory: Decreased short-term memory Following Commands: Follows one step commands with increased  time;Follows one step commands inconsistently Safety/Judgement: Decreased awareness of deficits Awareness: Intellectual   General Comments: dense cues to sequence and complete ex's      Exercises General Exercises - Lower Extremity Ankle Circles/Pumps: AROM;AAROM;5 reps Quad Sets: AROM;10 reps Gluteal Sets: AROM;10 reps Heel Slides: AAROM;AROM;10 reps Hip ABduction/ADduction: AROM;AAROM;10 reps Straight Leg Raises: AAROM;10 reps Hip Flexion/Marching: AAROM;10 reps    General Comments General comments (skin integrity, edema, etc.): pt assisted with LE strengthening and stretching ex      Pertinent Vitals/Pain Pain Assessment: No/denies pain    Home Living                      Prior Function            PT Goals (current goals can now be found in the care plan section) Acute Rehab PT Goals Patient Stated Goal: walk and get home Progress towards PT goals: Progressing toward goals    Frequency    Min 3X/week      PT Plan Current plan remains appropriate    Co-evaluation              AM-PAC PT "6 Clicks" Mobility   Outcome Measure  Help needed turning from your back to your  side while in a flat bed without using bedrails?: A Little Help needed moving from lying on your back to sitting on the side of a flat bed without using bedrails?: A Little Help needed moving to and from a bed to a chair (including a wheelchair)?: A Little Help needed standing up from a chair using your arms (e.g., wheelchair or bedside chair)?: A Little Help needed to walk in hospital room?: A Little Help needed climbing 3-5 steps with a railing? : A Little 6 Click Score: 18    End of Session   Activity Tolerance: Patient tolerated treatment well;Treatment limited secondary to medical complications (Comment) Patient left: in bed;with call bell/phone within reach;with bed alarm set Nurse Communication: Mobility status PT Visit Diagnosis: Unsteadiness on feet (R26.81);Other  abnormalities of gait and mobility (R26.89);Muscle weakness (generalized) (M62.81);Difficulty in walking, not elsewhere classified (R26.2)     Time: 7322-0254 PT Time Calculation (min) (ACUTE ONLY): 28 min  Charges:  $Therapeutic Exercise: 23-37 mins                Ivar Drape 06/27/2020, 11:09 PM  Samul Dada, PT MS Acute Rehab Dept. Number: [facility] [id number] and [facility] [place] [phone]

## 2020-06-27 NOTE — Progress Notes (Signed)
Pharmacy Antibiotic Note  Alyssa Hudson is a 79 y.o. female admitted on 06/19/2020 presenting as code stroke, known shingles infection and concern for related encephalopathy.  Pharmacy has been consulted for acyclovir dosing.  Pt not following commands at this time and not a good PO candidate for tx.    Will dose 10mg /kg based on actual body weight since BMI <30.   Renal function has increased again to SCr ~ 1, baseline SCr appears to fluctuate between 0.7-1.  Plan: -Reduce acyclovir 800 mg IV to q12h due to increase in SCr    Height: 5\' 5"  (165.1 cm) Weight: 74.8 kg (165 lb) (edit:bed would not zero out. subtracted 128 lbs to get wt.) IBW/kg (Calculated) : 57  Temp (24hrs), Avg:98.5 F (36.9 C), Min:97.8 F (36.6 C), Max:99 F (37.2 C)  Recent Labs  Lab 06/23/20 0403 06/23/20 0440 06/23/20 1917 06/24/20 0622 06/25/20 0730 06/26/20 0216 06/27/20 0750  WBC 14.0*  --   --  11.0* 9.3 10.3 9.4  CREATININE  --    < > 1.11* 0.94 0.74 0.70 1.01*   < > = values in this interval not displayed.    Estimated Creatinine Clearance: 44.2 mL/min (A) (by C-G formula based on SCr of 1.01 mg/dL (H)).    Allergies  Allergen Reactions  . Codeine Nausea Only and Nausea And Vomiting    Insomnia  . Contrast Media [Iodinated Diagnostic Agents] Other (See Comments)    Turned red all over  . Iodine-131 Hives and Swelling  . Sulfa Antibiotics Nausea Only    08/26/20 06/27/2020 1:44 PM    06/27/2020 1:43 PM

## 2020-06-28 DIAGNOSIS — Z7901 Long term (current) use of anticoagulants: Secondary | ICD-10-CM

## 2020-06-28 DIAGNOSIS — R079 Chest pain, unspecified: Secondary | ICD-10-CM

## 2020-06-28 DIAGNOSIS — I10 Essential (primary) hypertension: Secondary | ICD-10-CM

## 2020-06-28 DIAGNOSIS — N179 Acute kidney failure, unspecified: Secondary | ICD-10-CM

## 2020-06-28 LAB — CBC WITH DIFFERENTIAL/PLATELET
Abs Immature Granulocytes: 0.7 10*3/uL — ABNORMAL HIGH (ref 0.00–0.07)
Basophils Absolute: 0 10*3/uL (ref 0.0–0.1)
Basophils Relative: 0 %
Eosinophils Absolute: 0.2 10*3/uL (ref 0.0–0.5)
Eosinophils Relative: 2 %
HCT: 34.8 % — ABNORMAL LOW (ref 36.0–46.0)
Hemoglobin: 11.1 g/dL — ABNORMAL LOW (ref 12.0–15.0)
Immature Granulocytes: 7 %
Lymphocytes Relative: 21 %
Lymphs Abs: 2.1 10*3/uL (ref 0.7–4.0)
MCH: 26.7 pg (ref 26.0–34.0)
MCHC: 31.9 g/dL (ref 30.0–36.0)
MCV: 83.7 fL (ref 80.0–100.0)
Monocytes Absolute: 2.9 10*3/uL — ABNORMAL HIGH (ref 0.1–1.0)
Monocytes Relative: 28 %
Neutro Abs: 4.3 10*3/uL (ref 1.7–7.7)
Neutrophils Relative %: 42 %
Platelets: 240 10*3/uL (ref 150–400)
RBC: 4.16 MIL/uL (ref 3.87–5.11)
RDW: 14.5 % (ref 11.5–15.5)
WBC: 10.2 10*3/uL (ref 4.0–10.5)
nRBC: 0 % (ref 0.0–0.2)

## 2020-06-28 LAB — COMPREHENSIVE METABOLIC PANEL
ALT: 15 U/L (ref 0–44)
AST: 33 U/L (ref 15–41)
Albumin: 2.5 g/dL — ABNORMAL LOW (ref 3.5–5.0)
Alkaline Phosphatase: 45 U/L (ref 38–126)
Anion gap: 11 (ref 5–15)
BUN: 17 mg/dL (ref 8–23)
CO2: 25 mmol/L (ref 22–32)
Calcium: 8.3 mg/dL — ABNORMAL LOW (ref 8.9–10.3)
Chloride: 98 mmol/L (ref 98–111)
Creatinine, Ser: 0.83 mg/dL (ref 0.44–1.00)
GFR calc Af Amer: >60 mL/min (ref >60)
GFR calc non Af Amer: >60 mL/min (ref >60)
Glucose, Bld: 118 mg/dL — ABNORMAL HIGH (ref 70–99)
Potassium: 4.1 mmol/L (ref 3.5–5.1)
Sodium: 134 mmol/L — ABNORMAL LOW (ref 135–145)
Total Bilirubin: 1.5 mg/dL — ABNORMAL HIGH (ref 0.3–1.2)
Total Protein: 5.9 g/dL — ABNORMAL LOW (ref 6.5–8.1)

## 2020-06-28 LAB — MAGNESIUM
Magnesium: 0.9 mg/dL — CL (ref 1.7–2.4)
Magnesium: 1.7 mg/dL (ref 1.7–2.4)
Magnesium: 1.7 mg/dL (ref 1.7–2.4)

## 2020-06-28 LAB — TROPONIN I (HIGH SENSITIVITY)
Troponin I (High Sensitivity): 4 ng/L (ref <18)
Troponin I (High Sensitivity): 5 ng/L (ref <18)

## 2020-06-28 LAB — ANAEROBIC CULTURE

## 2020-06-28 MED ORDER — MAGNESIUM SULFATE 2 GM/50ML IV SOLN
2.0000 g | Freq: Once | INTRAVENOUS | Status: AC
  Administered 2020-06-28: 2 g via INTRAVENOUS
  Filled 2020-06-28: qty 50

## 2020-06-28 NOTE — Progress Notes (Signed)
Tele called and states "Pt has bunch of PVCs and Bigeminy notified MD on call. "

## 2020-06-28 NOTE — Progress Notes (Signed)
Subjective:   Alyssa Hudson was seen and evaluated at bedside this morning. She is accompanied by her husband, Yehuda Mao.  Around 1 AM this morning telemetry called to notify the night team of bigeminy occurring intermittently as well as prior PVCs. When examined by Dr. Thedore Mins as well patient reported having chest pain but denied shortness of breath, arm pain, nausea, jaw pain. Magnesium was ordered and was shown to be 0.9, subsequently repleted.  She states that she is doing well today. She continues to feel more and more like herself but does not feel that she is 100% back to her baseline yet. She endorses a headache but denies any vision changes, blurred vision, shortness of breath, dysuria. All questions and concerns were addressed at bedside.   Objective:  Vital signs in last 24 hours: Vitals:   06/28/20 0500 06/28/20 0815 06/28/20 0831 06/28/20 1219  BP:  (!) 137/57 (!) 145/62 (!) 114/57  Pulse:  93 96 96  Resp:  (!) 26 19 (!) 26  Temp:  99.6 F (37.6 C) 99 F (37.2 C) 98.8 F (37.1 C)  TempSrc:  Oral Oral Oral  SpO2:  94% 95% 95%  Weight: 84.6 kg     Height:      Physical Exam Constitutional:      General: She is not in acute distress.    Appearance: Normal appearance. She is not ill-appearing or toxic-appearing.     Comments: Sitting comfortably in her chair. NAD. Answers questions appropriately.   HENT:     Head: Normocephalic and atraumatic.     Comments: L sided facial droop Eyes:     General:        Right eye: No discharge.        Left eye: No discharge.     Conjunctiva/sclera: Conjunctivae normal.  Cardiovascular:     Rate and Rhythm: Normal rate and regular rhythm.     Pulses: Normal pulses.     Heart sounds: Normal heart sounds. No murmur heard.  No friction rub. No gallop.   Pulmonary:     Effort: Pulmonary effort is normal.     Breath sounds: Normal breath sounds. No wheezing, rhonchi or rales.  Abdominal:     General: Abdomen is flat. Bowel sounds are  normal.     Palpations: Abdomen is soft.     Tenderness: There is no abdominal tenderness. There is no guarding.  Neurological:     Mental Status: She is alert and oriented to person, place, and time.     Comments: L sided facial droop  Psychiatric:        Behavior: Behavior normal.     Assessment/Plan:  Principal Problem:   Encephalopathy Active Problems:   Hyponatremia  Acute Encephalopathy - improved Patient developed seizure-like symptoms acutely the morning of 06/19/20 following severe headache and abdominal pain, in the setting of acute shingles infection. CT head without contrast showed small vessel disease and an equivocal asymmetric increased density of the Right MCA, without acute disease process. MRI brain without contrast showed chronic lacunar infarcts vs. perivascular spaces in the bilateral BG and chronic lacunar infarct of the right caudate with tiny infarcts of bilateral cerebellar hemispheres, but no acute intracranial abnormality. EEG showed cortical dysfunction of the R temporal region and moderate diffuse encephalopathy possibly consistent with post-ictal state. Differential includes VZV encephalitis versus metabolic encephalopathy 2/2 urinary infection.  - CMV negative.  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  -  IV acyclovir as below  - Blood cultures negative  Active Varicella Zoster Infection Patient's husband states patient was diagnosed with shingles in May of this year, which resolved, but developed recurrence along her left breast/axillary region x 3 weeks. Patient had been in severe pain but had stopped taking her home Valtrex with 4 remaining doses prior to admission. May be contributing factor to encephalitis. - Acyclovir 800mg  q8h - IV acyclovir day 10. Plan to transition to PO prior to discharge for a 14-day course - Monitor for improvement in rash and mental status   Hypomagnesemia Patient had  nonsustained PVCs and bigeminy noted on telemetry this morning, when evaluated at bedside she endorsed some chest pain but no shortness of breath. -Magnesium levels on 06/22/2020 were 1.4, trended down to 0.9 on 07/25/2020 -Repleted with 2 g this morning -Continue to monitor telemetry  Urinary retention Patient with a history consistent with a mixed picture of urge and overflow incontinence developed overflow incontinence requiring I/O cath multiple times per day. She has been able to pass a small amount of urine, 100 cc, however shown to retain on bladder scan requiring I&O cath.  - Foley in place, plan to discharge with foley in place, follow up soon after discharge in our clinic for referral to urology  UTI with possible pyelonephritis - resolved BUN on admission was 14, trended up to 27. Cr was 0.72, trended up to 2.6. Possibly 2/2 acyclovir, but due to the need for coverage of VZV and HSV encephalitis. U/A showed cloudy urine with large leukocytes, negative nitrites, small hemoglobinuria, no bacteria, >50 WBC, 11-20 RBC and mucus. Triple phosphate crystals present. CT abdomen/pelvis without contrast showed perinephric edema but no nephrolithiasis. Bladder distended with air-fluid level. Patient afebrile, initial WBC 18.1. Denies associated symptoms at this time.  - Urine culture grew Klebsiella Pneumoniae and Proteus Vulgaris, she was treated with 7 days of ceftazidime as above. - BMP as above  Hypotonic, Hypochloremic Hyponatremia - resolved Initial sodium 123, chloride 86, Serum osms 259, Urine osms and urine sodium inappropriately elevated at 505 and 75, respectively. Patient is on HCTZ 25mg  and Losartan 100mg  daily at home. Patient appears euvolemic today. Creatinine and BUN downtrending. Possibly due to a combined picture of SIADH and home medications HCTZ and Losartan. No ECHO on file although CT abdomen/pelvis showed possible cirrhosis, mild hepatic steatosis.  - Check BMPs    Diverticulosis with possible acute Diverticulitis  Patient has had nausea, vomiting, decreased PO intake, and intermittent severe abdominal pain per patient's husband with benign physical exam aside from moderate abdominal distention. Patient afebrile. CT abdomen pelvis showed colonic diverticulosis with mild pericolonic fat stranding involving the junction of the descending and sigmoid colon in the region of multiple diverticula, suspicious for acute diverticulitis without perforation or abscess. Flagyl DC'd 9/29 - Daily CBC, vitals   AKI - resolved - BUN and sCr continuing to be WNL  HTN Home meds include HCTZ 25mg , Losartan 100mg , and Metoprolol succinate 100mg  daily  - Continue Norvasc to 10 qD and home metoprolol 100mg  PO qD.  - continue home med losartan 100 mg daily - Patient may benefit outpatient from lisinopril if microalbuminuria continues   Hepatomegaly with Borderline Hepatic Steatosis   Husband notes no alcohol history, ETOH < 10, urine tox negative. HIV negative, no known history of cirrhosis. Likely due to NASH given HTN, pre-diabetes, patient is overweight. Albumin 3.1, no thrombocytopenia. - Hep C Ab non reactive - PT/INR within normal limits, likely not cirrhosis  Prior to Admission Living Arrangement: Home Anticipated Discharge Location: Home vs. SNF Barriers to Discharge: Acute encephalopathy, possible infectious etiologies   [place] Callas, Medical Student 06/28/2020, 12:47 PM Pager: [phone] After 5pm on weekdays and 1pm on weekends: On Call pager [phone]

## 2020-06-28 NOTE — Progress Notes (Signed)
Paged for telemetry showing bigeminy. On review of telemetry bigeminy occurred intermittently but frequent from 0100-0200, then resolved. Previous tele review showed intermittent PVCs. Patient seen in room, she is having mild superior central chest pain that started this evening. She denies shortness of breath, arm pain, nausea, or jaw pain. She has chronic numbness in her hands. No palpitations. She does not usually have reflux, no current heart burn, no dysphagia.  On physical exam she is back in NSR without m/r/g, lungs CTA, abdomen NTTP with normal BS, chest wall NTTP. Labs have been drawn this morning already and show no new abnormality except mild elevation in total bilirubin. Medications reviewed, unlikely secondary to medication cause.   - ECG - troponin  - add magnesium   Jazmin Ley A, DO 06/28/2020, 3:08 AM Pager: [phone]

## 2020-06-29 ENCOUNTER — Inpatient Hospital Stay (HOSPITAL_COMMUNITY): Payer: Medicare PPO

## 2020-06-29 DIAGNOSIS — I361 Nonrheumatic tricuspid (valve) insufficiency: Secondary | ICD-10-CM

## 2020-06-29 DIAGNOSIS — B3323 Viral pericarditis: Secondary | ICD-10-CM

## 2020-06-29 DIAGNOSIS — I480 Paroxysmal atrial fibrillation: Secondary | ICD-10-CM

## 2020-06-29 LAB — ECHOCARDIOGRAM COMPLETE
Area-P 1/2: 2.62 cm2
Height: 65 in
S' Lateral: 3.11 cm
Weight: 2980.62 oz

## 2020-06-29 LAB — CBC WITH DIFFERENTIAL/PLATELET
Abs Immature Granulocytes: 0.73 10*3/uL — ABNORMAL HIGH (ref 0.00–0.07)
Basophils Absolute: 0 10*3/uL (ref 0.0–0.1)
Basophils Relative: 0 %
Eosinophils Absolute: 0.1 10*3/uL (ref 0.0–0.5)
Eosinophils Relative: 1 %
HCT: 32.2 % — ABNORMAL LOW (ref 36.0–46.0)
Hemoglobin: 10.3 g/dL — ABNORMAL LOW (ref 12.0–15.0)
Immature Granulocytes: 6 %
Lymphocytes Relative: 25 %
Lymphs Abs: 2.9 10*3/uL (ref 0.7–4.0)
MCH: 26.7 pg (ref 26.0–34.0)
MCHC: 32 g/dL (ref 30.0–36.0)
MCV: 83.4 fL (ref 80.0–100.0)
Monocytes Absolute: 3.7 10*3/uL — ABNORMAL HIGH (ref 0.1–1.0)
Monocytes Relative: 32 %
Neutro Abs: 4.1 10*3/uL (ref 1.7–7.7)
Neutrophils Relative %: 36 %
Platelets: 235 10*3/uL (ref 150–400)
RBC: 3.86 MIL/uL — ABNORMAL LOW (ref 3.87–5.11)
RDW: 14.6 % (ref 11.5–15.5)
Smear Review: ADEQUATE
WBC: 11.5 10*3/uL — ABNORMAL HIGH (ref 4.0–10.5)
nRBC: 0 % (ref 0.0–0.2)

## 2020-06-29 LAB — COMPREHENSIVE METABOLIC PANEL
ALT: 13 U/L (ref 0–44)
AST: 12 U/L — ABNORMAL LOW (ref 15–41)
Albumin: 2.3 g/dL — ABNORMAL LOW (ref 3.5–5.0)
Alkaline Phosphatase: 57 U/L (ref 38–126)
Anion gap: 8 (ref 5–15)
BUN: 28 mg/dL — ABNORMAL HIGH (ref 8–23)
CO2: 30 mmol/L (ref 22–32)
Calcium: 8.4 mg/dL — ABNORMAL LOW (ref 8.9–10.3)
Chloride: 95 mmol/L — ABNORMAL LOW (ref 98–111)
Creatinine, Ser: 1.5 mg/dL — ABNORMAL HIGH (ref 0.44–1.00)
GFR calc Af Amer: 37 mL/min — ABNORMAL LOW (ref >60)
GFR calc non Af Amer: 32 mL/min — ABNORMAL LOW (ref >60)
Glucose, Bld: 125 mg/dL — ABNORMAL HIGH (ref 70–99)
Potassium: 3.6 mmol/L (ref 3.5–5.1)
Sodium: 133 mmol/L — ABNORMAL LOW (ref 135–145)
Total Bilirubin: 1.1 mg/dL (ref 0.3–1.2)
Total Protein: 5.7 g/dL — ABNORMAL LOW (ref 6.5–8.1)

## 2020-06-29 LAB — BRAIN NATRIURETIC PEPTIDE: B Natriuretic Peptide: 165.4 pg/mL — ABNORMAL HIGH (ref 0.0–100.0)

## 2020-06-29 LAB — TROPONIN I (HIGH SENSITIVITY)
Troponin I (High Sensitivity): 5 ng/L (ref <18)
Troponin I (High Sensitivity): 7 ng/L (ref <18)

## 2020-06-29 LAB — MAGNESIUM: Magnesium: 2.2 mg/dL (ref 1.7–2.4)

## 2020-06-29 LAB — GLUCOSE, CAPILLARY: Glucose-Capillary: 148 mg/dL — ABNORMAL HIGH (ref 70–99)

## 2020-06-29 LAB — HSV DNA BY PCR (REFERENCE LAB)

## 2020-06-29 LAB — TSH: TSH: 4.215 u[IU]/mL (ref 0.350–4.500)

## 2020-06-29 LAB — C-REACTIVE PROTEIN: CRP: 21.2 mg/dL — ABNORMAL HIGH (ref <1.0)

## 2020-06-29 LAB — SEDIMENTATION RATE: Sed Rate: 98 mm/hr — ABNORMAL HIGH (ref 0–22)

## 2020-06-29 MED ORDER — LACTATED RINGERS IV SOLN: INTRAVENOUS | Status: DC

## 2020-06-29 MED ORDER — POTASSIUM CHLORIDE CRYS ER 20 MEQ PO TBCR
40.0000 meq | EXTENDED_RELEASE_TABLET | Freq: Two times a day (BID) | ORAL | Status: AC
  Administered 2020-06-29 (×2): 40 meq via ORAL
  Filled 2020-06-29 (×2): qty 2

## 2020-06-29 MED ORDER — FUROSEMIDE 10 MG/ML IJ SOLN: 20.0000 mg | Freq: Once | INTRAMUSCULAR | Status: DC

## 2020-06-29 MED ORDER — PERFLUTREN LIPID MICROSPHERE
1.0000 mL | INTRAVENOUS | Status: AC | PRN
  Filled 2020-06-29: qty 10

## 2020-06-29 MED ORDER — COLCHICINE 0.6 MG PO TABS
0.6000 mg | ORAL_TABLET | Freq: Two times a day (BID) | ORAL | Status: DC
  Administered 2020-06-29 – 2020-07-01 (×5): 0.6 mg via ORAL
  Filled 2020-06-29 (×5): qty 1

## 2020-06-29 MED ORDER — COLCHICINE 0.6 MG PO TABS: 0.6000 mg | ORAL_TABLET | Freq: Two times a day (BID) | ORAL | Status: DC

## 2020-06-29 MED ORDER — FUROSEMIDE 10 MG/ML IJ SOLN
40.0000 mg | Freq: Once | INTRAMUSCULAR | Status: AC
  Administered 2020-06-29: 40 mg via INTRAVENOUS
  Filled 2020-06-29: qty 4

## 2020-06-29 MED ORDER — PERFLUTREN LIPID MICROSPHERE
INTRAVENOUS | Status: AC
  Administered 2020-06-29: 3 mL via INTRAVENOUS
  Filled 2020-06-29: qty 10

## 2020-06-29 MED ORDER — VALACYCLOVIR HCL 500 MG PO TABS
1000.0000 mg | ORAL_TABLET | Freq: Every day | ORAL | Status: DC
  Administered 2020-06-29 – 2020-07-01 (×3): 1000 mg via ORAL
  Filled 2020-06-29 (×3): qty 2

## 2020-06-29 MED ORDER — POLYETHYLENE GLYCOL 3350 17 G PO PACK
17.0000 g | PACK | Freq: Every day | ORAL | Status: DC
  Administered 2020-06-30 – 2020-07-01 (×2): 17 g via ORAL
  Filled 2020-06-29 (×2): qty 1

## 2020-06-29 NOTE — Progress Notes (Signed)
Occupational Therapy Treatment Patient Details Name: Alyssa Hudson MRN: [id number] DOB: [birth year] Today's Date: 06/29/2020    History of present illness Pt is an 79 year old woman with a history of HTN, recurrent shingles, and left facial nerve palsy presenting with with acute encephalopathy after a possible seizure event at home following a week of vomiting and abdominal pain with headache in the context of ongoing shingles.  CXR and MRI 9/24 negative for acute findings.   OT comments  Pt making steady progress towards OT goals this session.  Pt supine in bed upon arrival reporting no pain. Pt agreeable to OOB transfer and EOB ADLs. Overall, pt requires MIN A for bed mobility, MIN A for stand pivot transfer with RW, s/u for UB ADLS, and supervision for LB ADLs from EOB. Pt able to problem solve day of week and month with verbal cues. Pt would continue to benefit from skilled occupational therapy while admitted and after d/c to address the below listed limitations in order to improve overall functional mobility and facilitate independence with BADL participation. DC plan remains appropriate, will follow acutely per POC.     Follow Up Recommendations  Home health OT;Supervision/Assistance - 24 hour;Other (comment)    Equipment Recommendations       Recommendations for Other Services      Precautions / Restrictions Precautions Precautions: Fall Precaution Comments: monitor pulse and sats Restrictions Weight Bearing Restrictions: No       Mobility Bed Mobility Overal bed mobility: Needs Assistance Bed Mobility: Supine to Sit     Supine to sit: Min assist;HOB elevated     General bed mobility comments: reliant on use of bed railed with elevated HOB, pt required MIIN A to elevate trunk into sitting and x2 attempts  Transfers Overall transfer level: Needs assistance Equipment used: Rolling walker (2 wheeled) Transfers: Sit to/from [employer] Sit to Stand: Min  assist Stand pivot transfers: Min assist       General transfer comment: pt required MIN A to power into standing at pt insistent on placing BUEs on RW, MIN A to pivot to recliner needing assist for balance and equipment mgmt during transfer    Balance Overall balance assessment: Needs assistance Sitting-balance support: Feet supported;No upper extremity supported Sitting balance-Leahy Scale: Fair Sitting balance - Comments: pt able to sit EOB for LB dressing tasks with close min guard   Standing balance support: Bilateral upper extremity supported;Single extremity supported Standing balance-Leahy Scale: Poor Standing balance comment: pt reliant on BUE support and external assist                           ADL either performed or assessed with clinical judgement   ADL Overall ADL's : Needs assistance/impaired Eating/Feeding: Set up Eating/Feeding Details (indicate cue type and reason): pt requested assist to open juice but able to self feed independently Grooming: Brushing hair;Sitting;Set up Grooming Details (indicate cue type and reason): sitting EOB Upper Body Bathing: Total assistance;Sitting Upper Body Bathing Details (indicate cue type and reason): pt request OTA to wash back; total A from sitting EOB Lower Body Bathing: Supervison/ safety;Sitting/lateral leans Lower Body Bathing Details (indicate cue type and reason): simulated via LB dressing task, pt able to reach LB from sitting EOB     Lower Body Dressing: Supervision/safety;Sitting/lateral leans Lower Body Dressing Details (indicate cue type and reason): pt able to adjust socks from EOB, pt reports her husband has been helping assist with ADLS while in  the hospital, however pt reporte being mostly independent with ADLs at home prior to admission Toilet Transfer: Min guard;Minimal Production designer, theatre/television/film Details (indicate cue type and reason): pt insistent on placing BUEs on RW while pushing to stand, MIN  A - min guard for simulated toilet transfer to recliner with RW         Functional mobility during ADLs: Min guard;Minimal assistance;Rolling walker (stand pivot only) General ADL Comments: pt continues to present with decreased activity tolerance, impaired standing balance and decreased ability to care for self impacting pts ability to complete BADLs     Vision Baseline Vision/History: No visual deficits     Perception     Praxis      Cognition Arousal/Alertness: Awake/alert Behavior During Therapy: WFL for tasks assessed/performed Overall Cognitive Status: Impaired/Different from baseline Area of Impairment: Orientation;Attention;Following commands                 Orientation Level: Disoriented to;Time (disoriented to day of week but able to problem solve with one verbal cue, able to state month once cued " we just started a new month") Current Attention Level: Selective (distracted by other staff in room)   Following Commands: Follows one step commands with increased time       General Comments: pt following commands however requried cues to problem solve time. very pleasant during session. noted to have good safety awareness stating " I've got to back all the way up to the chair"        Exercises     Shoulder Instructions       General Comments noted rash on pts back; likely from shingles; RN aware and present to assess    Pertinent Vitals/ Pain       Pain Assessment: No/denies pain  Home Living                                          Prior Functioning/Environment              Frequency           Progress Toward Goals  OT Goals(current goals can now be found in the care plan section)  Progress towards OT goals: Progressing toward goals  Acute Rehab OT Goals Patient Stated Goal: walk and get home OT Goal Formulation: With patient/family Time For Goal Achievement: 07/08/20 Potential to Achieve Goals: Good  Plan  Discharge plan remains appropriate;Frequency remains appropriate    Co-evaluation                 AM-PAC OT "6 Clicks" Daily Activity     Outcome Measure   Help from another person eating meals?: A Little Help from another person taking care of personal grooming?: A Little Help from another person toileting, which includes using toliet, bedpan, or urinal?: A Little Help from another person bathing (including washing, rinsing, drying)?: A Lot Help from another person to put on and taking off regular upper body clothing?: None Help from another person to put on and taking off regular lower body clothing?: A Little 6 Click Score: 18    End of Session Equipment Utilized During Treatment: Gait belt;Rolling walker  OT Visit Diagnosis: Unsteadiness on feet (R26.81);Pain   Activity Tolerance Patient tolerated treatment well   Patient Left in chair;with call bell/phone within reach;with chair alarm set   Nurse Communication Mobility status  Time: 2951-8841 OT Time Calculation (min): 17 min  Charges: OT General Charges $OT Visit: 1 Visit OT Treatments $Self Care/Home Management : 8-22 mins  Audery Amel., COTA/L Acute Rehabilitation Services [phone]    Angelina Pih 06/29/2020, 8:10 AM

## 2020-06-29 NOTE — Progress Notes (Signed)
RN received call from Tele stating patient had converted to afib HR in the 80s.  Patient alert with no complaints.    RN notified provider on called.  MD advised that would come see patient.  RN will continue to Monitor patient.

## 2020-06-29 NOTE — Progress Notes (Signed)
RN notified attending MD about reported STE of 2.3 on patient around 0620.Patient stable at this point and denies any chest pains. See orders in epic

## 2020-06-29 NOTE — Progress Notes (Signed)
Subjective:   Alyssa Hudson was seen and evaluated at bedside this morning. She is accompanied by her husband, Yehuda Mao. Patient reports she is doing well and continues to feel more like herself every day. She reports feeling hot last night but denies chest pain, shortness of breath, fevers, chills, abdominal pain, dysuria. About the time she was having breakfast today and was getting back in bed she had a short period of heart palpitations where she felt like her heart was racing which has since resolved. There was no associated shortness of breath or chest pain, no nausea, no vomiting, no pain in the arm or neck.   Spoke with son Mellody Dance, explained patient being in atrial fibrillation. He also requested [facility] health be the ones who provide home health.   Objective:  Vital signs in last 24 hours: Vitals:   06/29/20 0025 06/29/20 0328 06/29/20 0453 06/29/20 0733  BP: (!) 146/65 (!) 152/63  140/69  Pulse: 88 90  96  Resp: 18 17  18   Temp: 98.5 F (36.9 C) 98.1 F (36.7 C)  98.4 F (36.9 C)  TempSrc: Oral Oral  Oral  SpO2: 94% 92%  93%  Weight:   84.5 kg   Height:      Physical Exam Constitutional:      General: She is not in acute distress.    Appearance: Normal appearance. She is not ill-appearing or toxic-appearing.     Comments: Sitting comfortably in her chair. NAD. Answers questions appropriately.   HENT:     Head: Normocephalic and atraumatic.     Comments: L sided facial droop Eyes:     General:        Right eye: No discharge.        Left eye: No discharge.     Conjunctiva/sclera: Conjunctivae normal.  Cardiovascular:     Rate and Rhythm: Normal rate and regular rhythm.     Pulses: Normal pulses.     Heart sounds: Normal heart sounds. No murmur heard.  No friction rub. No gallop.   Pulmonary:     Effort: Pulmonary effort is normal.     Breath sounds: Normal breath sounds. No wheezing, rhonchi or rales.  Abdominal:     General: Abdomen is flat. Bowel sounds  are normal.     Palpations: Abdomen is soft.     Tenderness: There is no abdominal tenderness. There is no guarding.  Neurological:     Mental Status: She is alert and oriented to person, place, and time.     Comments: L sided facial droop  Psychiatric:        Behavior: Behavior normal.     Assessment/Plan:  Principal Problem:   Encephalopathy Active Problems:   Hyponatremia  Acute Encephalopathy - improved Patient developed seizure-like symptoms acutely the morning of 06/19/20 following severe headache and abdominal pain, in the setting of acute shingles infection. CT head without contrast showed small vessel disease and an equivocal asymmetric increased density of the Right MCA, without acute disease process. MRI brain without contrast showed chronic lacunar infarcts vs. perivascular spaces in the bilateral BG and chronic lacunar infarct of the right caudate with tiny infarcts of bilateral cerebellar hemispheres, but no acute intracranial abnormality. EEG showed cortical dysfunction of the R temporal region and moderate diffuse encephalopathy possibly consistent with post-ictal state. Differential includes VZV encephalitis versus metabolic encephalopathy 2/2 urinary infection.  - CMV negative.  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  -  Blood cultures negative  Active Varicella Zoster Infection Patient's husband states patient was diagnosed with shingles in May of this year, which resolved, but developed recurrence along her left breast/axillary region x 3 weeks. Patient had been in severe pain but had stopped taking her home Valtrex with 4 remaining doses prior to admission. - D/C Acyclovir 800mg  q8h -Start  PO valtrex 1g/d  - Monitor for improvement in rash and mental status   Abnormal EKG -early yesterday morning telemetry picked up bigeminy around 1 AM, as well as intermittent PVCs. Patient denied chest pain, shortness of  breath at this time -This morning around 6 AM telemetry alarm went off concerning for ST changes and possible STEMI, patient was woken from sleep and denied chest pain, shortness of breath but endorsed diaphoresis. EKG from 6:48 AM showed ST elevations in leads I, II, V5, V6 with ST depressions in V1, V2, V3. Viral pericarditis is on the differential, more likely than ACS -Troponin from 06/29/2020 is 5 -Echo ordered, cardiology consulted  Hypomagnesemia Patient had nonsustained PVCs and bigeminy noted on telemetry this morning, when evaluated at bedside she endorsed some chest pain but no shortness of breath. -Magnesium levels on 06/22/2020 were 1.4, trended down to 0.9 on 07/25/2020 -Magnesium levels 06/29/20 on 2.2  -Continue to monitor telemetry  Urinary retention Patient with a history consistent with a mixed picture of urge and overflow incontinence developed overflow incontinence requiring I/O cath multiple times per day. She has been able to pass a small amount of urine, 100 cc, however shown to retain on bladder scan requiring I&O cath.  - Foley in place, plan to discharge with foley in place, follow up soon after discharge in our clinic for referral to urology  UTI with possible pyelonephritis - resolved BUN on admission was 14, trended up to 27. Cr was 0.72, trended up to 2.6. Possibly 2/2 acyclovir, but due to the need for coverage of VZV and HSV encephalitis. U/A showed cloudy urine with large leukocytes, negative nitrites, small hemoglobinuria, no bacteria, >50 WBC, 11-20 RBC and mucus. Triple phosphate crystals present. CT abdomen/pelvis without contrast showed perinephric edema but no nephrolithiasis. Bladder distended with air-fluid level. Patient afebrile, initial WBC 18.1. Denies associated symptoms at this time.  - Urine culture grew Klebsiella Pneumoniae and Proteus Vulgaris, she was treated with 7 days of ceftazidime as above. - BMP as above  Hypotonic, Hypochloremic  Hyponatremia - resolved Initial sodium 123, chloride 86, Serum osms 259, Urine osms and urine sodium inappropriately elevated at 505 and 75, respectively. Patient is on HCTZ 25mg  and Losartan 100mg  daily at home. Patient appears euvolemic today. Creatinine and BUN downtrending. Possibly due to a combined picture of SIADH and home medications HCTZ and Losartan. No ECHO on file although CT abdomen/pelvis showed possible cirrhosis, mild hepatic steatosis.  - Check BMPs   Diverticulosis with possible acute Diverticulitis  Patient has had nausea, vomiting, decreased PO intake, and intermittent severe abdominal pain per patient's husband with benign physical exam aside from moderate abdominal distention. Patient afebrile. CT abdomen pelvis showed colonic diverticulosis with mild pericolonic fat stranding involving the junction of the descending and sigmoid colon in the region of multiple diverticula, suspicious for acute diverticulitis without perforation or abscess. Flagyl DC'd 9/29 - Daily CBC, vitals   AKI - resolved - BUN and sCr continuing to be WNL  HTN Home meds include HCTZ 25mg , Losartan 100mg , and Metoprolol succinate 100mg  daily  - Continue Norvasc to 10 qD and home metoprolol 100mg  PO  qD.  - continue home med losartan 100 mg daily - Patient may benefit outpatient from lisinopril if microalbuminuria continues   Hepatomegaly with Borderline Hepatic Steatosis   Husband notes no alcohol history, ETOH < 10, urine tox negative. HIV negative, no known history of cirrhosis. Likely due to NASH given HTN, pre-diabetes, patient is overweight. Albumin 3.1, no thrombocytopenia. - Hep C Ab non reactive - PT/INR within normal limits, likely not cirrhosis   Prior to Admission Living Arrangement: Home Anticipated Discharge Location: Home vs. SNF Barriers to Discharge: Acute encephalopathy, possible infectious etiologies   Vona Callas, Medical Student 06/29/2020, 10:58 AM Pager:  [phone] After 5pm on weekdays and 1pm on weekends: On Call pager [phone]

## 2020-06-29 NOTE — Progress Notes (Addendum)
Paged for conversion into atrial fibrillation without RVR. Upon review of telemetry, she had an episode of A fib this morning which resolved spontaneously and was noted by cardiology and day team, throughout the day has had episodes of bigeminy and trigeminy, then around 2230 went into A fib, has never had RVR.  Continues to be in A fib at this time with rate in 80s, continues on Toprol. Patient endorses mild SOB since this afternoon, but denies acute onset of any symptoms in past few hours. She was sleeping until woken by nursing to assess for this. Denies chest pain or palpitations. She denies any known hx of A fib.   Most likely provoked by pericarditis. CHADSVASC score of 4. Most recent Mg was 2.2, K was 3.6 and repleted with .  Plan: -f/u stat BMP and Mg -f/u EKG -consider anticoagulation -continue Toprol  Addendum: by the time her new EKG was collected she had spontaneously converted back into sinus rhythm. She has old 1st degree AV block and ST elevations which were seen on prior EKG, reflecting pericarditis.  Addendum: went back into A fib around 0600, EKG obtained demonstrating A fib. K and Mg unremarkable.

## 2020-06-29 NOTE — Progress Notes (Signed)
Physical Therapy Treatment Patient Details Name: Alyssa Hudson MRN: [id number] DOB: 16-Oct-1940 Today's Date: 06/29/2020    History of Present Illness Pt is an 79 year old woman with a history of HTN, recurrent shingles, and left facial nerve palsy presenting with with acute encephalopathy after a possible seizure event at home following a week of vomiting and abdominal pain with headache in the context of ongoing shingles.  CXR and MRI 9/24 negative for acute findings.    PT Comments    Pt was able to be up to walk with resting HR at 102 and then 114 at end of walk on hallway.  Pt reports fatigue but did not walk yesterday.  Has a procedure this afternoon, but has motivation to be up moving.  No SOB with any mobility effort and no LOB, but does need cues for sequence and safety with use of walker.  Her plan remains the same, with acute focus on balance and endurance along with LE strengthening as tolerated.  Was quite tired after her walk despite no SOB.   Follow Up Recommendations  Home health PT;Supervision/Assistance - 24 hour     Equipment Recommendations  Rolling walker with 5" wheels    Recommendations for Other Services       Precautions / Restrictions Precautions Precautions: Fall Precaution Comments: monitor pulse and sats Restrictions Weight Bearing Restrictions: No    Mobility  Bed Mobility Overal bed mobility: Needs Assistance Bed Mobility: Supine to Sit;Sit to Supine Rolling: Min guard   Supine to sit: Min assist;Mod assist Sit to supine: Min assist   General bed mobility comments: requires more help when distracted  Transfers Overall transfer level: Needs assistance Equipment used: Rolling walker (2 wheeled) Transfers: Sit to/from Stand Sit to Stand: Min assist            Ambulation/Gait Ambulation/Gait assistance: Min guard Gait Distance (Feet): 130 Feet Assistive device: Rolling walker (2 wheeled) Gait Pattern/deviations: Step-through  pattern;Decreased stride length;Wide base of support Gait velocity: reduced Gait velocity interpretation: <1.31 ft/sec, indicative of household ambulator General Gait Details: tends to walk away from walker and requires reminders to stay within the boundaries of walker   Stairs             Wheelchair Mobility    Modified Rankin (Stroke Patients Only)       Balance Overall balance assessment: Needs assistance Sitting-balance support: Feet supported Sitting balance-Leahy Scale: Fair Sitting balance - Comments: sits side of bed with no LOB   Standing balance support: During functional activity;Bilateral upper extremity supported Standing balance-Leahy Scale: Poor                              Cognition Arousal/Alertness: Awake/alert Behavior During Therapy: WFL for tasks assessed/performed Overall Cognitive Status: Impaired/Different from baseline Area of Impairment: Awareness;Safety/judgement;Following commands;Memory;Attention                 Orientation Level: Time Current Attention Level: Selective Memory: Decreased short-term memory Following Commands: Follows one step commands inconsistently;Follows one step commands with increased time Safety/Judgement: Decreased awareness of deficits Awareness: Intellectual Problem Solving: Decreased initiation;Requires verbal cues General Comments: repetition of instructions was needed particularly during gait when she was distracted      Exercises      General Comments General comments (skin integrity, edema, etc.): spouse providing information about her weekend      Pertinent Vitals/Pain Pain Assessment: No/denies pain    Home Living  Prior Function            PT Goals (current goals can now be found in the care plan section) Acute Rehab PT Goals Patient Stated Goal: walk and get home    Frequency    Min 3X/week      PT Plan Current plan remains  appropriate    Co-evaluation              AM-PAC PT "6 Clicks" Mobility   Outcome Measure  Help needed turning from your back to your side while in a flat bed without using bedrails?: A Little Help needed moving from lying on your back to sitting on the side of a flat bed without using bedrails?: A Little Help needed moving to and from a bed to a chair (including a wheelchair)?: A Little Help needed standing up from a chair using your arms (e.g., wheelchair or bedside chair)?: A Little Help needed to walk in hospital room?: A Little Help needed climbing 3-5 steps with a railing? : A Lot 6 Click Score: 17    End of Session Equipment Utilized During Treatment: Gait belt Activity Tolerance: Patient tolerated treatment well;Treatment limited secondary to medical complications (Comment) Patient left: in bed;with call bell/phone within reach;with bed alarm set Nurse Communication: Mobility status PT Visit Diagnosis: Unsteadiness on feet (R26.81);Other abnormalities of gait and mobility (R26.89);Muscle weakness (generalized) (M62.81);Difficulty in walking, not elsewhere classified (R26.2)     Time: 0388-8280 PT Time Calculation (min) (ACUTE ONLY): 29 min  Charges:  $Gait Training: 8-22 mins $Therapeutic Activity: 8-22 mins                  Ivar Drape 06/29/2020, 12:23 PM  Samul Dada, PT MS Acute Rehab Dept. Number: [facility] [id number] and [facility] [phone]

## 2020-06-29 NOTE — Consult Note (Addendum)
Cardiology Consultation:   Patient ID: Rahi Chandonnet MRN: [id number]; DOB: 1941/09/22  Admit date: 06/19/2020 Date of Consult: 06/29/2020  Primary Care Provider: Paulina Fusi, MD [facility] Cardiologist:New   Patient Profile:   Chimamanda Siegfried is a 79 y.o. female with a hx of HTN and shingles who is being seen today for the evaluation of abnormal EKG/possible pericarditis at the request of Dr. Mikey Bussing.  History of Present Illness:   Ms. Holloway presented 06/19/20 with AMS and L sided weakness with headache and abdominal pain. She had shingles in 01/2020 however had reoccurrence 3 weeks prior to presentations. Work up showed UTI and Multifactorial toxic metabolic encephalopathy. Multiple failed LP and eventually underwent fluoroscopically guided lumbar puncture performed in IR with anesthesia. Empirically treated with acyclovir and doxycycline due to concern for VZV encephalitis and tick borne illness. treated with Valtrex for active Varicella zoster infection. Hospital course further complicated by  Urinary retention requiring I & O cath.   Yesterday monitor showed ventricular bigeminy and PVCs. EKG this morning showed diffuse ST elevation in inferior lateral leads and depression in V1-V3. Dr. [place] reviewed the EKG which consistent with pericarditis rather than STEMI. Telemetery concerning for afib this morning with elevated rate, however has clear P wave with improved rate. Cardiology is asked for further evaluation.   Hs-troponin is negative x 2.  SCr 1.5 K 3.6 Mg 2.2 TSH 4.2 Pending echo   Patient has chronic L facial droop and does Botox regularly. Denies prior cardiac issue. No chest pain, dyspnea, palpitations, orthopnea, PND, syncope or LE edema.   Past Medical History:  Diagnosis Date  . Facial droop   . Hypertension     Past Surgical History:  Procedure Laterality Date  . IR FLUORO GUIDED NEEDLE PLC ASPIRATION/INJECTION LOC  06/23/2020  . RADIOLOGY WITH  ANESTHESIA N/A 06/23/2020   Procedure: IR WITH ANESTHESIA;  Surgeon: Radiologist, Medication, MD;  Location: [facility] OR;  Service: Radiology;  Laterality: N/A;     Inpatient Medications: Scheduled Meds: . amLODipine  10 mg Oral Daily  . Chlorhexidine Gluconate Cloth  6 each Topical Daily  . enoxaparin (LOVENOX) injection  40 mg Subcutaneous Q24H  . hydrochlorothiazide  25 mg Oral Daily  . lidocaine  2 patch Transdermal Q24H  . losartan  100 mg Oral Daily  . metoprolol succinate  100 mg Oral Daily  . polyethylene glycol  17 g Oral Daily  . sodium chloride flush  10-40 mL Intracatheter Q12H  . valACYclovir  1,000 mg Oral Daily   Continuous Infusions: . sodium chloride Stopped (06/28/20 1759)  . lactated ringers 100 mL/hr at 06/29/20 1131  . levETIRAcetam 500 mg (06/29/20 1120)   PRN Meds: sodium chloride, acetaminophen **OR** acetaminophen, ondansetron **OR** ondansetron (ZOFRAN) IV, sodium chloride flush  Allergies:    Allergies  Allergen Reactions  . Codeine Nausea Only and Nausea And Vomiting    Insomnia  . Contrast Media [Iodinated Diagnostic Agents] Other (See Comments)    Turned red all over  . Iodine-131 Hives and Swelling  . Sulfa Antibiotics Nausea Only    Social History:   Social History   Socioeconomic History  . Marital status: Married    Spouse name: Not on file  . Number of children: Not on file  . Years of education: Not on file  . Highest education level: Not on file  Occupational History  . Not on file  Tobacco Use  . Smoking status: Not on file  Substance and Sexual Activity  .  Alcohol use: Not on file  . Drug use: Not on file  . Sexual activity: Not on file  Other Topics Concern  . Not on file  Social History Narrative  . Not on file   Social Determinants of Health   Financial Resource Strain:   . Difficulty of Paying Living Expenses: Not on file  Food Insecurity:   . Worried About Programme researcher, broadcasting/film/video in the Last Year: Not on file  . Ran Out  of Food in the Last Year: Not on file  Transportation Needs:   . Lack of Transportation (Medical): Not on file  . Lack of Transportation (Non-Medical): Not on file  Physical Activity:   . Days of Exercise per Week: Not on file  . Minutes of Exercise per Session: Not on file  Stress:   . Feeling of Stress : Not on file  Social Connections:   . Frequency of Communication with Friends and Family: Not on file  . Frequency of Social Gatherings with Friends and Family: Not on file  . Attends Religious Services: Not on file  . Active Member of Clubs or Organizations: Not on file  . Attends Banker Meetings: Not on file  . Marital Status: Not on file  Intimate Partner Violence:   . Fear of Current or Ex-Partner: Not on file  . Emotionally Abused: Not on file  . Physically Abused: Not on file  . Sexually Abused: Not on file    Family History:   Denies family hx of heart disease   ROS:  Please see the history of present illness.   All other ROS reviewed and negative.     Physical Exam/Data:   Vitals:   06/29/20 0025 06/29/20 0328 06/29/20 0453 06/29/20 0733  BP: (!) 146/65 (!) 152/63  140/69  Pulse: 88 90  96  Resp: 18 17  18   Temp: 98.5 F (36.9 C) 98.1 F (36.7 C)  98.4 F (36.9 C)  TempSrc: Oral Oral  Oral  SpO2: 94% 92%  93%  Weight:   84.5 kg   Height:        Intake/Output Summary (Last 24 hours) at 06/29/2020 1144 Last data filed at 06/29/2020 1129 Gross per 24 hour  Intake 550 ml  Output 550 ml  Net 0 ml   Last 3 Weights 06/29/2020 06/28/2020 06/26/2020  Weight (lbs) 186 lb 4.6 oz 186 lb 9.6 oz 165 lb  Weight (kg) 84.5 kg 84.641 kg 74.844 kg     Body mass index is 31 kg/m.  General:  Well nourished, well developed, in no acute distress HEENT: normal Lymph: no adenopathy Neck: no JVD Endocrine:  No thryomegaly Vascular: No carotid bruits; FA pulses 2+ bilaterally without bruits  Cardiac:  normal S1, S2; RRR; no murmur  Lungs:  clear to  auscultation bilaterally, no wheezing, rhonchi or rales  Abd: soft, nontender, no hepatomegaly  Ext: no edema Musculoskeletal:  No deformities, BUE and BLE strength normal and equal Skin: warm and dry  Neuro:  CNs 2-12 intact, L sided facial drop Psych:  Normal affect   EKG:  The EKG was personally reviewed and demonstrates:  Sinus rhythm, diffuse ST elevation in inferior lateral with ST depression anteriorly  Telemetry:  Telemetry was personally reviewed and demonstrates:  Sinus rhythm with concern for afib this morning  Relevant CV Studies:  Pending echo   Laboratory Data:  High Sensitivity Troponin:   Recent Labs  Lab 06/28/20 0216 06/28/20 0416 06/29/20 0746 06/29/20 08/29/20  TROPONINIHS 4 5 5 7      Chemistry Recent Labs  Lab 06/27/20 0750 06/28/20 0105 06/29/20 0419  NA 137 134* 133*  K 3.4* 4.1 3.6  CL 100 98 95*  CO2 25 25 30   GLUCOSE 133* 118* 125*  BUN 20 17 28*  CREATININE 1.01* 0.83 1.50*  CALCIUM 8.3* 8.3* 8.4*  GFRNONAA 52* >60 32*  GFRAA >60 >60 37*  ANIONGAP 12 11 8     Recent Labs  Lab 06/27/20 0750 06/28/20 0105 06/29/20 0419  PROT 5.8* 5.9* 5.7*  ALBUMIN 2.4* 2.5* 2.3*  AST 14* 33 12*  ALT 12 15 13   ALKPHOS 49 45 57  BILITOT 0.6 1.5* 1.1   Hematology Recent Labs  Lab 06/27/20 0750 06/28/20 0105 06/29/20 0419  WBC 9.4 10.2 11.5*  RBC 4.14 4.16 3.86*  HGB 11.3* 11.1* 10.3*  HCT 34.9* 34.8* 32.2*  MCV 84.3 83.7 83.4  MCH 27.3 26.7 26.7  MCHC 32.4 31.9 32.0  RDW 14.5 14.5 14.6  PLT 228 240 235     Radiology/Studies:  DG Chest 1 View  Result Date: 06/29/2020 CLINICAL DATA:  Difficulty breathing.  Acute coronary syndrome EXAM: CHEST  1 VIEW COMPARISON:  June 19, 2020 FINDINGS: There is a small left pleural effusion with bibasilar atelectasis. Lungs elsewhere clear. There is cardiomegaly with pulmonary vascularity within normal limits. No adenopathy. No bone lesions. IMPRESSION: Small left pleural effusion with bibasilar  atelectasis. Lungs elsewhere clear. Stable cardiomegaly. Electronically Signed   By: 08/28/20 III M.D.   On: 06/29/2020 08:08    Assessment and Plan:   1. Abnormal EKG - No STEMI on EKG per Dr. 08/29/2020. Troponin negative x 2. Asymptomatic.  - Overall presentations and EKG consistent with viral pericarditis.  - TSH normal - Pending echo - Check Sed rate and CRP - Likely tx with colchicine   2. Arrhthymias - Telemetry this morning concerning for afib when rate was in 110-120s however some clear P wave. With slow rate, rhythm is clearly sinus.  - Continue Toprol XL 100mg  qd - Keep K above 4 (3.6 today) and MG above 2 (2.2 today)  3. AKI - Renal function improved however again worsen to 1.5 today.    Otherwise per primary team. Dr. June 21, 2020 to see.   For questions or updates, please contact [facility] Please consult [email or web address] for contact info under    Bretta Bang, PA  06/29/2020 11:44 AM

## 2020-06-29 NOTE — Progress Notes (Signed)
Patient converted to A-fib with HR in the 110's, asymptomatic. MD notified, said they will come see her.

## 2020-06-29 NOTE — Progress Notes (Addendum)
   Echo personally reviewed, LVEF preserved with grade 2 DD - elevated LV filling pressure. There is a small focal pericardial effusion, supporting the diagnosis of pericarditis. ESR elevated at 98 and CRP elevated at 22. Will start colchicine 0.6 mg BID and give 40 mg IV lasix now (creatinine 1.5), hold HCTZ and consider additional diuresis as needed. D/c lactated ringers. BNP mildly elevated at 165. Would typically recommend NSAIDs, however, need to consider renal function and lack of chest pain.  Pixie Casino, MD, [facility], [place] Director of the [facility]  [facility] Diplomate of the American Board of Clinical Lipidology Attending Cardiologist  Direct Dial: [phone]  Fax: [phone]  Website:  [email or web address]

## 2020-06-29 NOTE — Progress Notes (Signed)
  Echocardiogram 2D Echocardiogram has been performed.  Alyssa Hudson Alyssa Hudson 06/29/2020, 2:16 PM

## 2020-06-30 DIAGNOSIS — N390 Urinary tract infection, site not specified: Secondary | ICD-10-CM

## 2020-06-30 DIAGNOSIS — B9789 Other viral agents as the cause of diseases classified elsewhere: Secondary | ICD-10-CM

## 2020-06-30 DIAGNOSIS — N10 Acute tubulo-interstitial nephritis: Secondary | ICD-10-CM

## 2020-06-30 DIAGNOSIS — E878 Other disorders of electrolyte and fluid balance, not elsewhere classified: Secondary | ICD-10-CM

## 2020-06-30 DIAGNOSIS — B028 Zoster with other complications: Secondary | ICD-10-CM

## 2020-06-30 DIAGNOSIS — I110 Hypertensive heart disease with heart failure: Secondary | ICD-10-CM

## 2020-06-30 DIAGNOSIS — R160 Hepatomegaly, not elsewhere classified: Secondary | ICD-10-CM

## 2020-06-30 DIAGNOSIS — B964 Proteus (mirabilis) (morganii) as the cause of diseases classified elsewhere: Secondary | ICD-10-CM

## 2020-06-30 DIAGNOSIS — I5031 Acute diastolic (congestive) heart failure: Secondary | ICD-10-CM

## 2020-06-30 DIAGNOSIS — K5792 Diverticulitis of intestine, part unspecified, without perforation or abscess without bleeding: Secondary | ICD-10-CM

## 2020-06-30 DIAGNOSIS — I309 Acute pericarditis, unspecified: Secondary | ICD-10-CM

## 2020-06-30 DIAGNOSIS — Z79899 Other long term (current) drug therapy: Secondary | ICD-10-CM

## 2020-06-30 DIAGNOSIS — I301 Infective pericarditis: Secondary | ICD-10-CM

## 2020-06-30 DIAGNOSIS — I4891 Unspecified atrial fibrillation: Secondary | ICD-10-CM

## 2020-06-30 DIAGNOSIS — R29810 Facial weakness: Secondary | ICD-10-CM

## 2020-06-30 DIAGNOSIS — K760 Fatty (change of) liver, not elsewhere classified: Secondary | ICD-10-CM

## 2020-06-30 DIAGNOSIS — N179 Acute kidney failure, unspecified: Secondary | ICD-10-CM

## 2020-06-30 DIAGNOSIS — R339 Retention of urine, unspecified: Secondary | ICD-10-CM

## 2020-06-30 DIAGNOSIS — B961 Klebsiella pneumoniae [K. pneumoniae] as the cause of diseases classified elsewhere: Secondary | ICD-10-CM

## 2020-06-30 DIAGNOSIS — K5730 Diverticulosis of large intestine without perforation or abscess without bleeding: Secondary | ICD-10-CM

## 2020-06-30 HISTORY — DX: Acute diastolic (congestive) heart failure: I50.31

## 2020-06-30 HISTORY — DX: Infective pericarditis: I30.1

## 2020-06-30 LAB — BASIC METABOLIC PANEL
Anion gap: 11 (ref 5–15)
Anion gap: 13 (ref 5–15)
BUN: 31 mg/dL — ABNORMAL HIGH (ref 8–23)
BUN: 30 mg/dL — ABNORMAL HIGH (ref 8–23)
CO2: 24 mmol/L (ref 22–32)
CO2: 24 mmol/L (ref 22–32)
Calcium: 8.5 mg/dL — ABNORMAL LOW (ref 8.9–10.3)
Calcium: 8.7 mg/dL — ABNORMAL LOW (ref 8.9–10.3)
Chloride: 97 mmol/L — ABNORMAL LOW (ref 98–111)
Chloride: 97 mmol/L — ABNORMAL LOW (ref 98–111)
Creatinine, Ser: 1.11 mg/dL — ABNORMAL HIGH (ref 0.44–1.00)
Creatinine, Ser: 1.26 mg/dL — ABNORMAL HIGH (ref 0.44–1.00)
GFR calc Af Amer: 54 mL/min — ABNORMAL LOW (ref >60)
GFR calc Af Amer: 46 mL/min — ABNORMAL LOW (ref >60)
GFR calc non Af Amer: 47 mL/min — ABNORMAL LOW (ref >60)
GFR calc non Af Amer: 40 mL/min — ABNORMAL LOW (ref >60)
Glucose, Bld: 119 mg/dL — ABNORMAL HIGH (ref 70–99)
Glucose, Bld: 111 mg/dL — ABNORMAL HIGH (ref 70–99)
Potassium: 3.9 mmol/L (ref 3.5–5.1)
Potassium: 4 mmol/L (ref 3.5–5.1)
Sodium: 132 mmol/L — ABNORMAL LOW (ref 135–145)
Sodium: 134 mmol/L — ABNORMAL LOW (ref 135–145)

## 2020-06-30 LAB — VARICELLA-ZOSTER BY PCR: Varicella-Zoster, PCR: NEGATIVE

## 2020-06-30 LAB — PATHOLOGIST SMEAR REVIEW

## 2020-06-30 LAB — MAGNESIUM
Magnesium: 2 mg/dL (ref 1.7–2.4)
Magnesium: 1.7 mg/dL (ref 1.7–2.4)

## 2020-06-30 LAB — GLUCOSE, CAPILLARY: Glucose-Capillary: 127 mg/dL — ABNORMAL HIGH (ref 70–99)

## 2020-06-30 MED ORDER — IBUPROFEN 200 MG PO TABS
600.0000 mg | ORAL_TABLET | Freq: Three times a day (TID) | ORAL | Status: DC
  Administered 2020-06-30 – 2020-07-01 (×4): 600 mg via ORAL
  Filled 2020-06-30 (×4): qty 3

## 2020-06-30 MED ORDER — FUROSEMIDE 40 MG PO TABS
40.0000 mg | ORAL_TABLET | Freq: Every day | ORAL | Status: DC
  Administered 2020-06-30 – 2020-07-01 (×2): 40 mg via ORAL
  Filled 2020-06-30 (×2): qty 1

## 2020-06-30 MED ORDER — MAGNESIUM SULFATE IN D5W 1-5 GM/100ML-% IV SOLN
1.0000 g | Freq: Once | INTRAVENOUS | Status: AC
  Administered 2020-06-30: 1 g via INTRAVENOUS
  Filled 2020-06-30: qty 100

## 2020-06-30 NOTE — Progress Notes (Signed)
      06/30/20 0606  Provider Notification  Provider Name/Title Imogene Burn  Date Provider Notified 06/30/20  Time Provider Notified [phone]  Notification Type Page  Notification Reason Other (Comment) (Patient converted to afib)

## 2020-06-30 NOTE — Care Management Important Message (Signed)
Important Message  Patient Details  Name: Alyssa Hudson MRN: [id number] Date of Birth: 1941/06/23   Medicare Important Message Given:  Yes     Tamar Lipscomb Stefan Church 06/30/2020, 4:29 PM

## 2020-06-30 NOTE — Progress Notes (Signed)
Subjective:   Alyssa Hudson was seen and evaluated at bedside this morning. She is accompanied by her husband, Yehuda Mao. Patient had an episode of atrial fibrillation without RVR this morning around 2230. She was awoken from her sleep by nursing and did not endorse any chest pain or palpitations. She denies a prior history of atrial fibrillation. In the room today she ported she is doing well overall. When she was woken up by nursing this morning she had a headache and some shortness of breath. She was given Tylenol which helped the headache slightly. She reports intermittent chills and sweating today. Denies chest pain, abdominal pain, dysuria. She was able to move her bowels last night.   Objective:  Vital signs in last 24 hours: Vitals:   06/30/20 0402 06/30/20 0500 06/30/20 0810 06/30/20 1147  BP: 119/64  140/89 (!) 141/63  Pulse: 77  83 88  Resp: 18  18 18   Temp: 98.6 F (37 C)  98.6 F (37 C) 98.4 F (36.9 C)  TempSrc:   Oral Oral  SpO2: 95%  95% 97%  Weight:  76.2 kg    Height:      Physical Exam Constitutional:      General: She is not in acute distress.    Appearance: Normal appearance. She is not ill-appearing or toxic-appearing.     Comments: Sitting comfortably in her chair. NAD. Answers questions appropriately.   HENT:     Head: Normocephalic and atraumatic.     Comments: L sided facial droop Eyes:     General:        Right eye: No discharge.        Left eye: No discharge.     Conjunctiva/sclera: Conjunctivae normal.  Cardiovascular:     Rate and Rhythm: Normal rate and regular rhythm.     Pulses: Normal pulses.     Heart sounds: Normal heart sounds. No murmur heard.  No friction rub. No gallop.   Pulmonary:     Effort: Pulmonary effort is normal.     Breath sounds: Normal breath sounds. No wheezing, rhonchi or rales.  Abdominal:     General: Abdomen is flat. Bowel sounds are normal.     Palpations: Abdomen is soft.     Tenderness: There is no abdominal  tenderness. There is no guarding.  Neurological:     Mental Status: She is alert and oriented to person, place, and time.     Comments: L sided facial droop  Psychiatric:        Behavior: Behavior normal.     Assessment/Plan:  Principal Problem:   Encephalopathy Active Problems:   Hyponatremia   Viral pericarditis with pericardial effusion   Acute diastolic heart failure (HCC)  Atrial fibrillation in the setting of pericarditis -Morning of 10/32021 telemetry picked up bigeminy around 1 AM, as well as intermittent PVCs. Patient denied chest pain, shortness of breath at this time -06/29/2020 around 6 AM telemetry alarm went off concerning for ST changes and possible STEMI, patient was woken from sleep and denied chest pain, shortness of breath but endorsed diaphoresis. EKG from 6:48 AM showed ST elevations in leads I, II, V5, V6 with ST depressions in V1, V2, V3. --Patient had an episode of atrial fibrillation without RVR this morning around 2230 -Troponin from 06/29/2020 is 5 -Echo from 06/29/2020 showed EF 60 to 65% with grade 2 diastolic dysfunction and increased LVEDP. Small pericardial effusion -Per cardiology she is now on colchicine 0.6 mg twice daily for 3  to 6 months, ibuprofen 600 mg 3 times daily for at least 2 weeks -She is rate controlled with Toprol-XL 100 mg daily -We will reach out to cardiology regarding possible anticoagulation  Acute Encephalopathy - improved Patient developed seizure-like symptoms acutely the morning of 06/19/20 following severe headache and abdominal pain, in the setting of acute shingles infection. CT head without contrast showed small vessel disease and an equivocal asymmetric increased density of the Right MCA, without acute disease process. MRI brain without contrast showed chronic lacunar infarcts vs. perivascular spaces in the bilateral BG and chronic lacunar infarct of the right caudate with tiny infarcts of bilateral cerebellar hemispheres, but no  acute intracranial abnormality. EEG showed cortical dysfunction of the R temporal region and moderate diffuse encephalopathy possibly consistent with post-ictal state. Differential includes VZV encephalitis versus metabolic encephalopathy 2/2 urinary infection.  - CMV negative.  -Finished course of prophylactic doxycycline for possible tickborne illness -Today is day 12 of antivirals, currently on Val acyclovir 1 g daily - Blood cultures negative  Active Varicella Zoster Infection Patient's husband states patient was diagnosed with shingles in May of this year, which resolved, but developed recurrence along her left breast/axillary region x 3 weeks. Patient had been in severe pain but had stopped taking her home Valtrex with 4 remaining doses prior to admission. - D/C Acyclovir 800mg  q8h -Continue PO valtrex 1g/d  - Monitor for improvement in rash and mental status   Hypomagnesemia Patient had nonsustained PVCs and bigeminy noted on telemetry this morning, when evaluated at bedside she endorsed some chest pain but no shortness of breath. -Magnesium levels on 06/22/2020 were 1.4, trended down to 0.9 on 07/25/2020 -Magnesium levels 06/29/20 on 2.2  -Continue to monitor telemetry  Urinary retention Patient with a history consistent with a mixed picture of urge and overflow incontinence developed overflow incontinence requiring I/O cath multiple times per day. She has been able to pass a small amount of urine, 100 cc, however shown to retain on bladder scan requiring I&O cath.  - Foley in place, plan to discharge with foley in place, follow up soon after discharge in our clinic for referral to urology  UTI with possible pyelonephritis - resolved BUN on admission was 14, trended up to 27. Cr was 0.72, trended up to 2.6. Possibly 2/2 acyclovir, but due to the need for coverage of VZV and HSV encephalitis. U/A showed cloudy urine with large leukocytes, negative nitrites, small hemoglobinuria, no  bacteria, >50 WBC, 11-20 RBC and mucus. Triple phosphate crystals present. CT abdomen/pelvis without contrast showed perinephric edema but no nephrolithiasis. Bladder distended with air-fluid level. Patient afebrile, initial WBC 18.1. Denies associated symptoms at this time.  - Urine culture grew Klebsiella Pneumoniae and Proteus Vulgaris, she was treated with 7 days of ceftazidime as above. - BMP as above  Hypotonic, Hypochloremic Hyponatremia - resolved Initial sodium 123, chloride 86, Serum osms 259, Urine osms and urine sodium inappropriately elevated at 505 and 75, respectively. Patient is on HCTZ 25mg  and Losartan 100mg  daily at home. Patient appears euvolemic today. Creatinine and BUN downtrending. Possibly due to a combined picture of SIADH and home medications HCTZ and Losartan. No ECHO on file although CT abdomen/pelvis showed possible cirrhosis, mild hepatic steatosis.  - Check BMPs   Diverticulosis with possible acute Diverticulitis  Patient has had nausea, vomiting, decreased PO intake, and intermittent severe abdominal pain per patient's husband with benign physical exam aside from moderate abdominal distention. Patient afebrile. CT abdomen pelvis showed colonic diverticulosis with  mild pericolonic fat stranding involving the junction of the descending and sigmoid colon in the region of multiple diverticula, suspicious for acute diverticulitis without perforation or abscess. Flagyl DC'd 9/29 - Daily CBC, vitals   AKI - resolved - BUN and sCr continuing to be WNL  HTN Home meds include HCTZ 25mg , Losartan 100mg , and Metoprolol succinate 100mg  daily  - Continue Norvasc to 10 qD and home metoprolol 100mg  PO qD.  - continue home med losartan 100 mg daily - Patient may benefit outpatient from lisinopril if microalbuminuria continues   Hepatomegaly with Borderline Hepatic Steatosis   Husband notes no alcohol history, ETOH < 10, urine tox negative. HIV negative, no known history of  cirrhosis. Likely due to NASH given HTN, pre-diabetes, patient is overweight. Albumin 3.1, no thrombocytopenia. - Hep C Ab non reactive - PT/INR within normal limits, likely not cirrhosis   Prior to Admission Living Arrangement: Home Anticipated Discharge Location: Home vs. SNF Barriers to Discharge: Acute encephalopathy, possible infectious etiologies   , Medical Student 06/30/2020, 3:45 PM Pager: [phone] After 5pm on weekdays and 1pm on weekends: On Call pager [phone]

## 2020-06-30 NOTE — Progress Notes (Addendum)
DAILY PROGRESS NOTE   Patient Name: Alyssa ApleyLorene Decock Date of Encounter: 06/30/2020 Cardiologist: No primary care provider on file.  Chief Complaint   Feels better today   Patient Profile   Alyssa Hudson is a 79 y.o. female with a hx of HTN and shingles who is being seen today for the evaluation of abnormal EKG/possible pericarditis at the request of Dr. Mikey BussingHoffman.  Subjective   Diuresed about 2L negative overnight- now 4.2L negative. Tolerating colchicine -renal function stable.  Echo suggestive of HFpEF - high filling pressure, good response to lasix. Small pericardial effusion as mentioned seems to correlate with EKG findings suggestive of pericarditis.  Objective   Vitals:   06/30/20 0402 06/30/20 0500 06/30/20 0810 06/30/20 1147  BP: 119/64  140/89 (!) 141/63  Pulse: 77  83 88  Resp: 18  18 18   Temp: 98.6 F (37 C)  98.6 F (37 C) 98.4 F (36.9 C)  TempSrc:   Oral Oral  SpO2: 95%  95% 97%  Weight:  76.2 kg    Height:        Intake/Output Summary (Last 24 hours) at 06/30/2020 1321 Last data filed at 06/30/2020 1006 Gross per 24 hour  Intake 497 ml  Output 2150 ml  Net -1653 ml   Filed Weights   06/28/20 0500 06/29/20 0453 06/30/20 0500  Weight: 84.6 kg 84.5 kg 76.2 kg    Physical Exam   General appearance: alert and no distress Neck: no carotid bruit, no JVD and thyroid not enlarged, symmetric, no tenderness/mass/nodules Lungs: clear to auscultation bilaterally Heart: regular rate and rhythm Abdomen: soft, non-tender; bowel sounds normal; no masses,  no organomegaly Extremities: extremities normal, atraumatic, no cyanosis or edema Pulses: 2+ and symmetric Skin: Skin color, texture, turgor normal. No rashes or lesions Neurologic: Mental status: Alert, oriented, thought content appropriate, left facial droop Psych: pleasant  Inpatient Medications    Scheduled Meds: . amLODipine  10 mg Oral Daily  . Chlorhexidine Gluconate Cloth  6 each Topical Daily   . colchicine  0.6 mg Oral BID  . enoxaparin (LOVENOX) injection  40 mg Subcutaneous Q24H  . lidocaine  2 patch Transdermal Q24H  . losartan  100 mg Oral Daily  . metoprolol succinate  100 mg Oral Daily  . polyethylene glycol  17 g Oral Daily  . sodium chloride flush  10-40 mL Intracatheter Q12H  . valACYclovir  1,000 mg Oral Daily    Continuous Infusions: . sodium chloride Stopped (06/29/20 2341)  . levETIRAcetam 500 mg (06/30/20 0955)    PRN Meds: sodium chloride, acetaminophen **OR** acetaminophen, ondansetron **OR** ondansetron (ZOFRAN) IV, sodium chloride flush   Labs   Results for orders placed or performed during the hospital encounter of 06/19/20 (from the past 48 hour(s))  Magnesium     Status: None   Collection Time: 06/28/20  2:49 PM  Result Value Ref Range   Magnesium 1.7 1.7 - 2.4 mg/dL    Comment: Performed at [facility], [address] [place], [place] [address]  Magnesium     Status: None   Collection Time: 06/29/20  4:19 AM  Result Value Ref Range   Magnesium 2.2 1.7 - 2.4 mg/dL    Comment: Performed at [facility], [address] [place], [place] [address]  CBC with Differential/Platelet     Status: Abnormal   Collection Time: 06/29/20  4:19 AM  Result Value Ref Range   WBC 11.5 (H) 4.0 - 10.5 K/uL  RBC 3.86 (L) 3.87 - 5.11 MIL/uL   Hemoglobin 10.3 (L) 12.0 - 15.0 g/dL   HCT 17.4 (L) 36 - 46 %   MCV 83.4 80.0 - 100.0 fL   MCH 26.7 26.0 - 34.0 pg   MCHC 32.0 30.0 - 36.0 g/dL   RDW 08.1 44.8 - 18.5 %   Platelets 235 150 - 400 K/uL   nRBC 0.0 0.0 - 0.2 %   Neutrophils Relative % 36 %   Neutro Abs 4.1 1.7 - 7.7 K/uL   Lymphocytes Relative 25 %   Lymphs Abs 2.9 0.7 - 4.0 K/uL   Monocytes Relative 32 %   Monocytes Absolute 3.7 (H) 0 - 1 K/uL   Eosinophils Relative 1 %   Eosinophils Absolute 0.1 0 - 0 K/uL   Basophils Relative 0 %   Basophils Absolute 0.0 0 - 0 K/uL   Smear Review PLATELETS APPEAR ADEQUATE    Immature Granulocytes 6  %   Abs Immature Granulocytes 0.73 (H) 0.00 - 0.07 K/uL    Comment: Performed at [facility], [address] [place], [place] [address]  Comprehensive metabolic panel     Status: Abnormal   Collection Time: 06/29/20  4:19 AM  Result Value Ref Range   Sodium 133 (L) 135 - 145 mmol/L   Potassium 3.6 3.5 - 5.1 mmol/L   Chloride 95 (L) 98 - 111 mmol/L   CO2 30 22 - 32 mmol/L   Glucose, Bld 125 (H) 70 - 99 mg/dL    Comment: Glucose reference range applies only to samples taken after fasting for at least 8 hours.   BUN 28 (H) 8 - 23 mg/dL   Creatinine, Ser 7.02 (H) 0.44 - 1.00 mg/dL   Calcium 8.4 (L) 8.9 - 10.3 mg/dL   Total Protein 5.7 (L) 6.5 - 8.1 g/dL   Albumin 2.3 (L) 3.5 - 5.0 g/dL   AST 12 (L) 15 - 41 U/L   ALT 13 0 - 44 U/L   Alkaline Phosphatase 57 38 - 126 U/L   Total Bilirubin 1.1 0.3 - 1.2 mg/dL   GFR calc non Af Amer 32 (L) >60 mL/min   GFR calc Af Amer 37 (L) >60 mL/min   Anion gap 8 5 - 15    Comment: Performed at [facility], [address], [place], [place] [address]  Pathologist smear review     Status: None   Collection Time: 06/29/20  4:19 AM  Result Value Ref Range   Path Review Leukocytosis with left shift     Comment: Normocytic anemia Reviewed by Talbert Forest. Rayetta Pigg, M.D. 06/29/2020 Performed at [facility], [address] [place], [place] [address]   Troponin I (High Sensitivity)     Status: None   Collection Time: 06/29/20  7:46 AM  Result Value Ref Range   Troponin I (High Sensitivity) 5 <18 ng/L    Comment: (NOTE) Elevated high sensitivity troponin I (hsTnI) values and significant  changes across serial measurements may suggest ACS but many other  chronic and acute conditions are known to elevate hsTnI results.  Refer to the "Links" section for chest pain algorithms and additional  guidance. Performed at [facility], [address], [place], [place] [address]   Glucose, capillary     Status: Abnormal   Collection  Time: 06/29/20  9:31 AM  Result Value Ref Range   Glucose-Capillary 148 (H) 70 - 99 mg/dL    Comment: Glucose  reference range applies only to samples taken after fasting for at least 8 hours.  Troponin I (High Sensitivity)     Status: None   Collection Time: 06/29/20  9:51 AM  Result Value Ref Range   Troponin I (High Sensitivity) 7 <18 ng/L    Comment: (NOTE) Elevated high sensitivity troponin I (hsTnI) values and significant  changes across serial measurements may suggest ACS but many other  chronic and acute conditions are known to elevate hsTnI results.  Refer to the "Links" section for chest pain algorithms and additional  guidance. Performed at [facility], [address] [place], [place] [address]   TSH     Status: None   Collection Time: 06/29/20 10:18 AM  Result Value Ref Range   TSH 4.215 0.350 - 4.500 uIU/mL    Comment: Performed by a 3rd Generation assay with a functional sensitivity of <=0.01 uIU/mL. Performed at [facility], [address], [place], [place] [address]   C-reactive protein     Status: Abnormal   Collection Time: 06/29/20 10:18 AM  Result Value Ref Range   CRP 21.2 (H) <1.0 mg/dL    Comment: Performed at [facility], [address], [place], [place] [address]  Sedimentation rate     Status: Abnormal   Collection Time: 06/29/20 10:18 AM  Result Value Ref Range   Sed Rate 98 (H) 0 - 22 mm/hr    Comment: Performed at [facility], [address] [place], [place] [address]  Brain natriuretic peptide     Status: Abnormal   Collection Time: 06/29/20  1:48 PM  Result Value Ref Range   B Natriuretic Peptide 165.4 (H) 0.0 - 100.0 pg/mL    Comment: Performed at [facility], [address] [place], [place] [address]  Basic metabolic panel     Status: Abnormal   Collection Time: 06/29/20 11:33 PM  Result Value Ref Range   Sodium 132 (L) 135 - 145 mmol/L   Potassium 3.9 3.5 - 5.1 mmol/L   Chloride 97 (L) 98 - 111 mmol/L    CO2 24 22 - 32 mmol/L   Glucose, Bld 119 (H) 70 - 99 mg/dL    Comment: Glucose reference range applies only to samples taken after fasting for at least 8 hours.   BUN 31 (H) 8 - 23 mg/dL   Creatinine, Ser 1.30 (H) 0.44 - 1.00 mg/dL   Calcium 8.5 (L) 8.9 - 10.3 mg/dL   GFR calc non Af Amer 47 (L) >60 mL/min   GFR calc Af Amer 54 (L) >60 mL/min   Anion gap 11 5 - 15    Comment: Performed at [facility], [address] [place], [place] [address]  Magnesium     Status: None   Collection Time: 06/29/20 11:33 PM  Result Value Ref Range   Magnesium 2.0 1.7 - 2.4 mg/dL    Comment: Performed at [facility], [address], [place], [place] [address]  Basic metabolic panel     Status: Abnormal   Collection Time: 06/30/20  1:28 AM  Result Value Ref Range   Sodium 134 (L) 135 - 145 mmol/L   Potassium 4.0 3.5 - 5.1 mmol/L   Chloride 97 (L) 98 - 111 mmol/L   CO2 24 22 - 32 mmol/L   Glucose, Bld 111 (H) 70 - 99 mg/dL    Comment: Glucose reference range applies only to samples taken after fasting  for at least 8 hours.   BUN 30 (H) 8 - 23 mg/dL   Creatinine, Ser 0.25 (H) 0.44 - 1.00 mg/dL   Calcium 8.7 (L) 8.9 - 10.3 mg/dL   GFR calc non Af Amer 40 (L) >60 mL/min   GFR calc Af Amer 46 (L) >60 mL/min   Anion gap 13 5 - 15    Comment: Performed at [facility], [address] [place], [place] [address]  Magnesium     Status: None   Collection Time: 06/30/20  1:28 AM  Result Value Ref Range   Magnesium 1.7 1.7 - 2.4 mg/dL    Comment: Performed at [facility], [address], [place], [place] [address]  Glucose, capillary     Status: Abnormal   Collection Time: 06/30/20 10:25 AM  Result Value Ref Range   Glucose-Capillary 127 (H) 70 - 99 mg/dL    Comment: Glucose reference range applies only to samples taken after fasting for at least 8 hours.    ECG   Sinus rhythm with inferior and anterolateral ST elevation - Personally Reviewed  Telemetry   Sinus rhythm  - Personally Reviewed  Radiology    DG Chest 1 View  Result Date: 06/29/2020 CLINICAL DATA:  Difficulty breathing.  Acute coronary syndrome EXAM: CHEST  1 VIEW COMPARISON:  June 19, 2020 FINDINGS: There is a small left pleural effusion with bibasilar atelectasis. Lungs elsewhere clear. There is cardiomegaly with pulmonary vascularity within normal limits. No adenopathy. No bone lesions. IMPRESSION: Small left pleural effusion with bibasilar atelectasis. Lungs elsewhere clear. Stable cardiomegaly. Electronically Signed   By: Bretta Bang III M.D.   On: 06/29/2020 08:08   ECHOCARDIOGRAM COMPLETE  Result Date: 06/29/2020    ECHOCARDIOGRAM REPORT   Patient Name:   MAHIMA HOTTLE Date of Exam: 06/29/2020 Medical Rec #:  [id number]       Height:       65.0 in Accession #:    [id number]      Weight:       186.3 lb Date of Birth:  Feb 03, 1941       BSA:          1.919 m Patient Age:    79 years        BP:           126/57 mmHg Patient Gender: F               HR:           92 bpm. Exam Location:  Inpatient Procedure: 2D Echo, Cardiac Doppler, Color Doppler and Intracardiac            Opacification Agent Indications:    I48.0 Paroxysmal atrial fibrillation  History:        Patient has no prior history of Echocardiogram examinations.                 Risk Factors:Hypertension.  Sonographer:    Elmarie Shiley Dance Referring Phys: [id number] ERIK C HOFFMAN IMPRESSIONS  1. Left ventricular ejection fraction, by estimation, is 60 to 65%. The left ventricle has normal function. The left ventricle has no regional wall motion abnormalities. Left ventricular diastolic parameters are consistent with Grade II diastolic dysfunction (pseudonormalization). Elevated left ventricular end-diastolic pressure.  2. Right ventricular systolic function is normal. The right ventricular size is normal. There is normal pulmonary artery systolic pressure.  3. Left atrial size was mildly dilated.  4. A small pericardial effusion is present. The  pericardial effusion is lateral to the left ventricle.  5. The mitral valve is normal in structure. No evidence of mitral valve regurgitation. No evidence of mitral stenosis.  6. The aortic valve is normal in structure. Aortic valve regurgitation is not visualized. Mild to moderate aortic valve sclerosis/calcification is present, without any evidence of aortic stenosis.  7. The inferior vena cava is dilated in size with <50% respiratory variability, suggesting right atrial pressure of 15 mmHg. FINDINGS  Left Ventricle: Left ventricular ejection fraction, by estimation, is 60 to 65%. The left ventricle has normal function. The left ventricle has no regional wall motion abnormalities. Definity contrast agent was given IV to delineate the left ventricular  endocardial borders. The left ventricular internal cavity size was normal in size. There is no left ventricular hypertrophy. Left ventricular diastolic parameters are consistent with Grade II diastolic dysfunction (pseudonormalization). Elevated left ventricular end-diastolic pressure. Right Ventricle: The right ventricular size is normal. No increase in right ventricular wall thickness. Right ventricular systolic function is normal. There is normal pulmonary artery systolic pressure. The tricuspid regurgitant velocity is 2.50 m/s, and  with an assumed right atrial pressure of 8 mmHg, the estimated right ventricular systolic pressure is 33.0 mmHg. Left Atrium: Left atrial size was mildly dilated. Right Atrium: Right atrial size was normal in size. Pericardium: A small pericardial effusion is present. The pericardial effusion is lateral to the left ventricle. Mitral Valve: The mitral valve is normal in structure. No evidence of mitral valve regurgitation. No evidence of mitral valve stenosis. Tricuspid Valve: The tricuspid valve is normal in structure. Tricuspid valve regurgitation is mild . No evidence of tricuspid stenosis. Aortic Valve: The aortic valve is normal  in structure. Aortic valve regurgitation is not visualized. Mild to moderate aortic valve sclerosis/calcification is present, without any evidence of aortic stenosis. Pulmonic Valve: The pulmonic valve was normal in structure. Pulmonic valve regurgitation is not visualized. No evidence of pulmonic stenosis. Aorta: The aortic root is normal in size and structure. Venous: The inferior vena cava is dilated in size with less than 50% respiratory variability, suggesting right atrial pressure of 15 mmHg. IAS/Shunts: No atrial level shunt detected by color flow Doppler.  LEFT VENTRICLE PLAX 2D LVIDd:         4.51 cm  Diastology LVIDs:         3.11 cm  LV e' medial:    6.31 cm/s LV PW:         0.95 cm  LV E/e' medial:  16.8 LV IVS:        0.93 cm  LV e' lateral:   4.46 cm/s LVOT diam:     2.00 cm  LV E/e' lateral: 23.8 LV SV:         60 LV SV Index:   31 LVOT Area:     3.14 cm  RIGHT VENTRICLE             IVC RV Basal diam:  2.35 cm     IVC diam: 2.30 cm RV S prime:     14.00 cm/s TAPSE (M-mode): 1.8 cm LEFT ATRIUM             Index LA diam:        3.90 cm 2.03 cm/m LA Vol (A2C):   55.9 ml 29.12 ml/m LA Vol (A4C):   50.3 ml 26.21 ml/m LA Biplane Vol: 53.4 ml 27.82 ml/m  AORTIC VALVE LVOT Vmax:   97.40 cm/s LVOT Vmean:  67.700 cm/s LVOT VTI:  0.192 m  AORTA Ao Root diam: 3.50 cm Ao Asc diam:  3.00 cm MITRAL VALVE                TRICUSPID VALVE MV Area (PHT): 2.62 cm     TR Peak grad:   25.0 mmHg MV Decel Time: 289 msec     TR Vmax:        250.00 cm/s MV E velocity: 106.00 cm/s MV A velocity: 111.00 cm/s  SHUNTS MV E/A ratio:  0.95         Systemic VTI:  0.19 m                             Systemic Diam: 2.00 cm Charlton Haws MD Electronically signed by Charlton Haws MD Signature Date/Time: 06/29/2020/2:32:24 PM    Final     Cardiac Studies   N/A  Assessment   Principal Problem:   Encephalopathy Active Problems:   Hyponatremia   Viral pericarditis with pericardial effusion   Acute diastolic heart failure  (HCC)   Plan   1. Viral pericarditis - she feels better today, not sure if it is colchicine or diuresis. Was hesitant to use NSAIDS, due to renal disease, but that is improved with diuresis. Best outcomes with pericarditis on both NSAIDs and colchicine. Start ibuprofen 600 mg TID today for at least 2 weeks and continue colchicine for 3-6 months. 2. Acute diastolic CHF (HFpEF) - diuresed another 2L overnight- noted to have increased filling pressures with moderate diastolic dysfunction. Upper and lower extremity edema improved. Was on HCTZ at home, would like keep on oral maintenance loop diuretic. Hyponatremia improving- now 134 today. Suspect hypervolemic hyponatremia.  Time Spent Directly with Patient:  I have spent a total of 25 minutes with the patient reviewing hospital notes, telemetry, EKGs, labs and examining the patient as well as establishing an assessment and plan that was discussed personally with the patient.  > 50% of time was spent in direct patient care.  Length of Stay:  LOS: 11 days   Chrystie Nose, MD, [place], FACP  [place]  [facility]  Medical Director of the [facility]  Cardiovascular Risk Reduction Clinic Diplomate of the American Board of Clinical Lipidology Attending Cardiologist  Direct Dial: [phone]  Fax: [phone]  Website:  [email or web address]  Lisette Abu Carsyn Taubman 06/30/2020, 1:21 PM

## 2020-07-01 LAB — C-REACTIVE PROTEIN: CRP: 12.8 mg/dL — ABNORMAL HIGH (ref <1.0)

## 2020-07-01 LAB — BASIC METABOLIC PANEL
Anion gap: 10 (ref 5–15)
BUN: 28 mg/dL — ABNORMAL HIGH (ref 8–23)
CO2: 27 mmol/L (ref 22–32)
Calcium: 8.9 mg/dL (ref 8.9–10.3)
Chloride: 100 mmol/L (ref 98–111)
Creatinine, Ser: 0.93 mg/dL (ref 0.44–1.00)
GFR calc non Af Amer: 58 mL/min — ABNORMAL LOW (ref >60)
Glucose, Bld: 113 mg/dL — ABNORMAL HIGH (ref 70–99)
Potassium: 3.8 mmol/L (ref 3.5–5.1)
Sodium: 137 mmol/L (ref 135–145)

## 2020-07-01 LAB — SEDIMENTATION RATE: Sed Rate: 123 mm/hr — ABNORMAL HIGH (ref 0–22)

## 2020-07-01 LAB — GLUCOSE, CAPILLARY: Glucose-Capillary: 112 mg/dL — ABNORMAL HIGH (ref 70–99)

## 2020-07-01 MED ORDER — VALACYCLOVIR HCL 500 MG PO TABS: 1000.0000 mg | ORAL_TABLET | Freq: Two times a day (BID) | ORAL | Status: DC

## 2020-07-01 MED ORDER — AMLODIPINE BESYLATE 10 MG PO TABS: 10.0000 mg | ORAL_TABLET | Freq: Every day | ORAL | 0 refills | Status: DC

## 2020-07-01 MED ORDER — FUROSEMIDE 40 MG PO TABS
40.0000 mg | ORAL_TABLET | Freq: Every day | ORAL | 0 refills | Status: DC
Start: 2020-07-02 — End: 2020-09-21

## 2020-07-01 MED ORDER — PANTOPRAZOLE SODIUM 20 MG PO TBEC
20.0000 mg | DELAYED_RELEASE_TABLET | Freq: Every day | ORAL | Status: DC
  Administered 2020-07-01: 20 mg via ORAL
  Filled 2020-07-01: qty 1

## 2020-07-01 MED ORDER — MAGNESIUM SULFATE 50 % IJ SOLN
1.0000 g | Freq: Once | INTRAMUSCULAR | Status: DC
  Filled 2020-07-01: qty 2

## 2020-07-01 MED ORDER — MAGNESIUM SULFATE IN D5W 1-5 GM/100ML-% IV SOLN
1.0000 g | Freq: Once | INTRAVENOUS | Status: AC
  Administered 2020-07-01: 1 g via INTRAVENOUS
  Filled 2020-07-01: qty 100

## 2020-07-01 MED ORDER — IBUPROFEN 600 MG PO TABS
600.0000 mg | ORAL_TABLET | Freq: Three times a day (TID) | ORAL | 0 refills | Status: DC
Start: 2020-07-01 — End: 2020-11-17

## 2020-07-01 MED ORDER — COLCHICINE 0.6 MG PO TABS: 0.6000 mg | ORAL_TABLET | Freq: Two times a day (BID) | ORAL | 0 refills | Status: DC

## 2020-07-01 NOTE — Discharge Instructions (Signed)
To [name],  It was a pleasure taking care of you during your stay. You have completed your antiviral medication during your stay. For your pericarditis, please take your colchicine and Ibuprofen as directed on the label. Please follow up with your cardiologist and primary care physician. Please go to the nearest ED or urgent care if you begin to spike high fevers, severe pain, or acute change in your mental status.   Heart Failure Education: 1. Weigh yourself EVERY morning after you go to the bathroom but before you eat or drink anything. Write this number down in a weight log/diary. If you gain 3 pounds overnight or 5 pounds in a week, call the office. 2. Take your medicines as prescribed. If you have concerns about your medications, please call us before you stop taking them.  3. Eat low salt foods--Limit salt (sodium) to 2000 mg per day. This will help prevent your body from holding onto fluid. Read food labels as many processed foods have a lot of sodium, especially canned goods and prepackaged meats. If you would like some assistance choosing low sodium foods, we would be happy to set you up with a nutritionist. 4. Stay as active as you can everyday. Staying active will give you more energy and make your muscles stronger. Start with 5 minutes at a time and work your way up to 30 minutes a day. Break up your activities--do some in the morning and some in the afternoon. Start with 3 days per week and work your way up to 5 days as you can.  If you have chest pain, feel short of breath, dizzy, or lightheaded, STOP. If you don't feel better after a short rest, call 911. If you do feel better, call the office to let us know you have symptoms with exercise. 5. Limit all fluids for the day to less than 2 liters. Fluid includes all drinks, coffee, juice, ice chips, soup, jello, and all other liquids.     Shingles  Shingles, which is also known as herpes zoster, is an infection that causes a painful  skin rash and fluid-filled blisters. It is caused by a virus. Shingles only develops in people who:  Have had chickenpox.  Have been given a medicine to protect against chickenpox (have been vaccinated). Shingles is rare in this group. What are the causes? Shingles is caused by varicella-zoster virus (VZV). This is the same virus that causes chickenpox. After a person is exposed to VZV, the virus stays in the body in an inactive (dormant) state. Shingles develops if the virus is reactivated. This can happen many years after the first (initial) exposure to VZV. It is not known what causes this virus to be reactivated. What increases the risk? People who have had chickenpox or received the chickenpox vaccine are at risk for shingles. Shingles infection is more common in people who:  Are older than age 3.  Have a weakened disease-fighting system (immune system), such as people with: ? HIV. ? AIDS. ? Cancer.  Are taking medicines that weaken the immune system, such as transplant medicines.  Are experiencing a lot of stress. What are the signs or symptoms? Early symptoms of this condition include itching, tingling, and pain in an area on your skin. Pain may be described as burning, stabbing, or throbbing. A few days or weeks after early symptoms start, a painful red rash appears. The rash is usually on one side of the body and has a band-like or belt-like pattern. The  rash eventually turns into fluid-filled blisters that break open, change into scabs, and dry up in about 2-3 weeks. At any time during the infection, you may also develop:  A fever.  Chills.  A headache.  An upset stomach. How is this diagnosed? This condition is diagnosed with a skin exam. Skin or fluid samples may be taken from the blisters before a diagnosis is made. These samples are examined under a microscope or sent to a lab for testing. How is this treated? The rash may last for several weeks. There is not a  specific cure for this condition. Your health care provider will probably prescribe medicines to help you manage pain, recover more quickly, and avoid long-term problems. Medicines may include:  Antiviral drugs.  Anti-inflammatory drugs.  Pain medicines.  Anti-itching medicines (antihistamines). If the area involved is on your face, you may be referred to a specialist, such as an eye doctor (ophthalmologist) or an ear, nose, and throat (ENT) doctor (otolaryngologist) to help you avoid eye problems, chronic pain, or disability. Follow these instructions at home: Medicines  Take over-the-counter and prescription medicines only as told by your health care provider.  Apply an anti-itch cream or numbing cream to the affected area as told by your health care provider. Relieving itching and discomfort   Apply cold, wet cloths (cold compresses) to the area of the rash or blisters as told by your health care provider.  Cool baths can be soothing. Try adding baking soda or dry oatmeal to the water to reduce itching. Do not bathe in hot water. Blister and rash care  Keep your rash covered with a loose bandage (dressing). Wear loose-fitting clothing to help ease the pain of material rubbing against the rash.  Keep your rash and blisters clean by washing the area with mild soap and cool water as told by your health care provider.  Check your rash every day for signs of infection. Check for: ? More redness, swelling, or pain. ? Fluid or blood. ? Warmth. ? Pus or a bad smell.  Do not scratch your rash or pick at your blisters. To help avoid scratching: ? Keep your fingernails clean and cut short. ? Wear gloves or mittens while you sleep, if scratching is a problem. General instructions  Rest as told by your health care provider.  Keep all follow-up visits as told by your health care provider. This is important.  Wash your hands often with soap and water. If soap and water are not  available, use hand sanitizer. Doing this lowers your chance of getting a bacterial skin infection.  Before your blisters change into scabs, your shingles infection can cause chickenpox in people who have never had it or have never been vaccinated against it. To prevent this from happening, avoid contact with other people, especially: ? Babies. ? Pregnant women. ? Children who have eczema. ? Elderly people who have transplants. ? People who have chronic illnesses, such as cancer or AIDS. Contact a health care provider if:  Your pain is not relieved with prescribed medicines.  Your pain does not get better after the rash heals.  You have signs of infection in the rash area, such as: ? More redness, swelling, or pain around the rash. ? Fluid or blood coming from the rash. ? The rash area feeling warm to the touch. ? Pus or a bad smell coming from the rash. Get help right away if:  The rash is on your face or nose.  You  have facial pain, pain around your eye area, or loss of feeling on one side of your face.  You have difficulty seeing.  You have ear pain or have ringing in your ear.  You have a loss of taste.  Your condition gets worse. Summary  Shingles, which is also known as herpes zoster, is an infection that causes a painful skin rash and fluid-filled blisters.  This condition is diagnosed with a skin exam. Skin or fluid samples may be taken from the blisters and examined before the diagnosis is made.  Keep your rash covered with a loose bandage (dressing). Wear loose-fitting clothing to help ease the pain of material rubbing against the rash.  Before your blisters change into scabs, your shingles infection can cause chickenpox in people who have never had it or have never been vaccinated against it. This information is not intended to replace advice given to you by your health care provider. Make sure you discuss any questions you have with your health care  provider. Document Revised: 01/04/2019 Document Reviewed: 05/17/2017 Elsevier Patient Education  2020 [employer].

## 2020-07-01 NOTE — TOC Transition Note (Signed)
Transition of Care [facility]) - CM/SW Discharge Note   Patient Details  Name: Alyssa Hudson MRN: [id number] Date of Birth: 03/04/1941  [facility] CM/SW Contact:  Kermit Balo, RN Phone Number: 07/01/2020, 4:30 PM   Clinical Narrative:    Pt discharging home with [facility] services through [facility].  Pt and spouse state they have walker at home.  Pt has needed supervision at home and transportation to home.   Final next level of care: Home w Home Health Services Barriers to Discharge: No Barriers Identified   Patient Goals and CMS Choice   CMS Medicare.gov Compare Post Acute Care list provided to:: Patient Choice offered to / list presented to : Patient, Spouse  Discharge Placement                       Discharge Plan and Services   Discharge Planning Services: CM Consult Post Acute Care Choice: Durable Medical Equipment          DME Arranged:  (CM provided spouse the orders for lift chair)         HH Arranged: PT, OT, Nurse's Aide HH Agency: [facility] Health Date [facility] Agency Contacted: 07/01/20   Representative spoke with at [facility] Agency: [facility]  Social Determinants of Health (SDOH) Interventions     Readmission Risk Interventions No flowsheet data found.

## 2020-07-01 NOTE — Progress Notes (Signed)
Discharged to home after IV access removed.  Discharge instructions given and all questions answered.  Foley was discontinued at 1300 and she has been voiding without difficulty since (large amounts, as is on Lasix).

## 2020-07-01 NOTE — Progress Notes (Signed)
Physical Therapy Treatment Patient Details Name: Alyssa Hudson MRN: [id number] DOB: 1941/08/27 Today's Date: 07/01/2020    History of Present Illness Pt is an 79 year old woman with a history of HTN, recurrent shingles, and left facial nerve palsy presenting with with acute encephalopathy after a possible seizure event at home following a week of vomiting and abdominal pain with headache in the context of ongoing shingles.  CXR and MRI 9/24 negative for acute findings. During her hospital stay she was also found to have viral pericarditis and acute diastolic CHF.    PT Comments    Pt is making progress. She continues to display memory deficits and difficulty following commands and therefore requires continual reminders or cues to perform all functional mobility safely. Pt and pt's husband have been educated on safe utilization and transfers in rollator, as pt reports this is the device she will be utilizing at home. She continues to push her assistive device distal anteriorly to her and requires cues to remain within the walker. Pt and pt's husband were educated on transitioning to RW rather than rollator if they find the rollator is unsafe at home. They were informed that a RW is the current recommendation though due to her cognitive deficits. During stair negotiation, pt required hand placement on a sturdy surface rather than HHA as she displayed 1 LOB with minA to recover when descending 2 steps with only R HHA. Pt and pt's husband educated on safe stair negotiation at home.   Follow Up Recommendations  Home health PT;Supervision/Assistance - 24 hour     Equipment Recommendations  Rolling walker with 5" wheels (educated pt and husband on RW if rollator unsafe)    Recommendations for Other Services       Precautions / Restrictions Precautions Precautions: Fall Precaution Comments: monitor pulse and sats Restrictions Weight Bearing Restrictions: No    Mobility  Bed Mobility                   Transfers Overall transfer level: Needs assistance Equipment used: Rolling walker (2 wheeled);4-wheeled walker Transfers: Sit to/from Stand Sit to Stand: Min guard         General transfer comment: STS from multiple surfaces with cues prior to each transfer to place hands on current surface rather than walker for safety reasons, poor carryover noted. Unsteadiness noted upon coming to stand in which pt required extra time to become steady prior to moving.  Ambulation/Gait Ambulation/Gait assistance: Min guard Gait Distance (Feet): 130 Feet (x2 bouts, with seated rest break between bouts) Assistive device: Rolling walker (2 wheeled);4-wheeled walker Gait Pattern/deviations: Step-through pattern;Decreased stride length;Wide base of support Gait velocity: reduced Gait velocity interpretation: <1.31 ft/sec, indicative of household ambulator General Gait Details: Tends to walk away from walker and requires reminders to stay within the boundaries of walker. Transitioned from RW to rollator to practice for d/c home as pt reports having a rollator at home. Pt educated on safe use of breaks, proper placement of rollator with gait, and safe transfers within rollator, with poor carryover noted and continual reminders. No overt LOB noted.   Stairs Stairs: Yes Stairs assistance: Min assist Stair Management: One rail Right;Step to pattern;Alternating pattern (R rail ascending; R HHA descending without use of rails) Number of Stairs: 2 General stair comments: Ascending quickly with reciprocal gait pattern and use of R handrail, no LOB. Descending with R HHA and cued pt to avoid utilizing hand rails. Descended with step-to gait pattern, displaying 1 LOB requiring minA to  recover.    Wheelchair Mobility    Modified Rankin (Stroke Patients Only)       Balance Overall balance assessment: Needs assistance         Standing balance support: During functional activity;Bilateral  upper extremity supported   Standing balance comment: Requires B UE support for all mobility, displaying mild-mod trunk sway on occasion.                            Cognition Arousal/Alertness: Awake/alert Behavior During Therapy: WFL for tasks assessed/performed Overall Cognitive Status: Impaired/Different from baseline Area of Impairment: Awareness;Safety/judgement;Following commands;Memory;Attention                   Current Attention Level: Selective Memory: Decreased short-term memory Following Commands: Follows one step commands inconsistently;Follows one step commands with increased time;Follows multi-step commands inconsistently;Follows multi-step commands with increased time Safety/Judgement: Decreased awareness of safety;Decreased awareness of deficits Awareness: Intellectual Problem Solving: Decreased initiation;Requires verbal cues General Comments: Required continual reminders for safe hand placement with transfers and with sequencing utilization or rollator, such as pushing it up against a wall and then locking breaks prior to sitting. Educated husband on necessity to provide cues to pt upon d/c home for safety.      Exercises      General Comments        Pertinent Vitals/Pain Pain Assessment: No/denies pain Pain Intervention(s): Monitored during session    Home Living                      Prior Function            PT Goals (current goals can now be found in the care plan section) Acute Rehab PT Goals Patient Stated Goal: Pt states "I wish I could go to the bathroom more". Pt desires to return home soon. PT Goal Formulation: With patient/family Time For Goal Achievement: 07/08/20 Potential to Achieve Goals: Good Progress towards PT goals: Progressing toward goals    Frequency    Min 3X/week      PT Plan Current plan remains appropriate    Co-evaluation              AM-PAC PT "6 Clicks" Mobility   Outcome  Measure  Help needed turning from your back to your side while in a flat bed without using bedrails?: A Little Help needed moving from lying on your back to sitting on the side of a flat bed without using bedrails?: A Little Help needed moving to and from a bed to a chair (including a wheelchair)?: A Little Help needed standing up from a chair using your arms (e.g., wheelchair or bedside chair)?: A Little Help needed to walk in hospital room?: A Little Help needed climbing 3-5 steps with a railing? : A Little 6 Click Score: 18    End of Session Equipment Utilized During Treatment: Gait belt Activity Tolerance: Patient limited by fatigue;Patient tolerated treatment well Patient left: in chair;with call bell/phone within reach;with family/visitor present (husband present)   PT Visit Diagnosis: Unsteadiness on feet (R26.81);Other abnormalities of gait and mobility (R26.89);Muscle weakness (generalized) (M62.81);Difficulty in walking, not elsewhere classified (R26.2)     Time: 5427-0623 PT Time Calculation (min) (ACUTE ONLY): 26 min  Charges:  $Gait Training: 8-22 mins $Therapeutic Activity: 8-22 mins                     Raymond Gurney, PT, DPT  Acute Rehabilitation Services  Pager: [phone] Office: [phone]    Jewel Baize 07/01/2020, 4:15 PM

## 2020-07-01 NOTE — Progress Notes (Signed)
PHARMACY NOTE:  ANTIMICROBIAL RENAL DOSAGE ADJUSTMENT  Current antimicrobial regimen includes a mismatch between antimicrobial dosage and estimated renal function.  As per policy approved by the Pharmacy & Therapeutics and Medical Executive Committees, the antimicrobial dosage will be adjusted accordingly.  Current antimicrobial dosage:  Valtrex 1gm PO daily  Indication: HSV encephalopathy  Renal Function:  Estimated Creatinine Clearance: 48.3 mL/min (by C-G formula based on SCr of 0.93 mg/dL). []      On intermittent HD, scheduled: []      On CRRT    Antimicrobial dosage has been changed to:  Valtrex 1gm PO BID  Additional comments:   Alyssa Grindle A. , PharmD, BCPS, FNKF Clinical Pharmacist [place] Please utilize Amion for appropriate phone number to reach the unit pharmacist [facility])   07/01/2020 10:43 AM

## 2020-07-01 NOTE — Progress Notes (Addendum)
Progress Note  Patient Name: Alyssa Hudson Date of Encounter: 07/01/2020  Primary Cardiologist: She would like to follow in our [place] office  Subjective   Patient is feeling good this morning. Eager to go home today. Reports swelling is improving. No complaints of chest pain, SOB, dizziness, lightheadedness, or palpitations. Asked specifically about symptoms yesterday morning when she was noted to be in atrial fibrillation and she was asymptomatic.   Inpatient Medications    Scheduled Meds:  amLODipine  10 mg Oral Daily   Chlorhexidine Gluconate Cloth  6 each Topical Daily   colchicine  0.6 mg Oral BID   enoxaparin (LOVENOX) injection  40 mg Subcutaneous Q24H   furosemide  40 mg Oral Daily   ibuprofen  600 mg Oral TID   lidocaine  2 patch Transdermal Q24H   losartan  100 mg Oral Daily   metoprolol succinate  100 mg Oral Daily   polyethylene glycol  17 g Oral Daily   sodium chloride flush  10-40 mL Intracatheter Q12H   Continuous Infusions:  sodium chloride Stopped (06/29/20 2341)   PRN Meds: sodium chloride, acetaminophen **OR** acetaminophen, ondansetron **OR** ondansetron (ZOFRAN) IV, sodium chloride flush   Vital Signs    Vitals:   06/30/20 2059 06/30/20 2309 07/01/20 0403 07/01/20 0423  BP: 131/62 (!) 131/53 (!) 129/59   Pulse: 80 79 77   Resp: _0 Temp: 97.9 F (36.6 C) 97.6 F (36.4 C) 98.4 F (36.9 C)   TempSrc: Oral Oral Oral   SpO2: 96% 94% 95%   Weight:    75.8 kg  Height:        Intake/Output Summary (Last 24 hours) at 07/01/2020 1141 Last data filed at 07/01/2020 0548 Gross per 24 hour  Intake --  Output 2350 ml  Net -2350 ml   Filed Weights   06/29/20 0453 06/30/20 0500 07/01/20 0423  Weight: 84.5 kg 76.2 kg 75.8 kg    Telemetry    Appears to have had an episode of atrial fibrillation yesterday morning for several hours before returning to sinus rhythm with 1st degree AV block. Rates in Afib were in the 80s-100s.  - Personally  Reviewed  ECG    EKG yesterday morning with atrial fibrillation with rate 86 bpm with continue ST-T wave abnormalities c/w pericarditis - Personally Reviewed  Physical Exam   GEN: No acute distress.   Neck: No JVD, no carotid bruits Cardiac: RRR, no murmurs, rubs, or gallops.  Respiratory: Clear to auscultation bilaterally, no wheezes/ rales/ rhonchi GI: NABS, Soft, nontender, non-distended  MS: No edema; No deformity. Neuro:  Nonfocal, moving all extremities spontaneously Psych: Normal affect   Labs    Chemistry Recent Labs  Lab 06/27/20 0750 06/27/20 0750 06/28/20 0105 06/28/20 0105 06/29/20 0419 06/29/20 0419 06/29/20 2333 06/30/20 0128 07/01/20 0340  NA 137   < > 134*   < > 133*   < > 132* 134* 137  K 3.4*   < > 4.1   < > 3.6   < > 3.9 4.0 3.8  CL 100   < > 98   < > 95*   < > 97* 97* 100  CO2 25   < > 25   < > 30   < > _1 GLUCOSE 133*   < > 118*   < > 125*   < > 119* 111* 113*  BUN 20   < > 17   < > 28*   < >  31* 30* 28*  CREATININE 1.01*   < > 0.83   < > 1.50*   < > 1.11* 1.26* 0.93  CALCIUM 8.3*   < > 8.3*   < > 8.4*   < > 8.5* 8.7* 8.9  PROT 5.8*  --  5.9*  --  5.7*  --   --   --   --   ALBUMIN 2.4*  --  2.5*  --  2.3*  --   --   --   --   AST 14*  --  33  --  12*  --   --   --   --   ALT 12  --  15  --  13  --   --   --   --   ALKPHOS 49  --  45  --  57  --   --   --   --   BILITOT 0.6  --  1.5*  --  1.1  --   --   --   --   GFRNONAA 52*   < > >60   < > 32*   < > 47* 40* 58*  GFRAA >60   < > >60   < > 37*  --  54* 46*  --   ANIONGAP 12   < > 11   < > 8   < > _0 < > = values in this interval not displayed.     Hematology Recent Labs  Lab 06/27/20 0750 06/28/20 0105 06/29/20 0419  WBC 9.4 10.2 11.5*  RBC 4.14 4.16 3.86*  HGB 11.3* 11.1* 10.3*  HCT 34.9* 34.8* 32.2*  MCV 84.3 83.7 83.4  MCH 27.3 26.7 26.7  MCHC 32.4 31.9 32.0  RDW 14.5 14.5 14.6  PLT 228 240 235    Cardiac EnzymesNo results for input(s): TROPONINI in the last 168  hours. No results for input(s): TROPIPOC in the last 168 hours.   BNP Recent Labs  Lab 06/29/20 1348  BNP 165.4*     DDimer No results for input(s): DDIMER in the last 168 hours.   Radiology    ECHOCARDIOGRAM COMPLETE  Result Date: 06/29/2020    ECHOCARDIOGRAM REPORT   Patient Name:   Alyssa Hudson Date of Exam: 06/29/2020 Medical Rec #:  [id number]       Height:       65.0 in Accession #:    [id number]      Weight:       186.3 lb Date of Birth:  Jul 23, 1941       BSA:          1.919 m Patient Age:    44 years        BP:           126/57 mmHg Patient Gender: F               HR:           92 bpm. Exam Location:  Inpatient Procedure: 2D Echo, Cardiac Doppler, Color Doppler and Intracardiac            Opacification Agent Indications:    I48.0 Paroxysmal atrial fibrillation  History:        Patient has no prior history of Echocardiogram examinations.                 Risk Factors:Hypertension.  Sonographer:    Jonelle Sidle Dance Referring Phys: [id number] [place]  1. Left  ventricular ejection fraction, by estimation, is 60 to 65%. The left ventricle has normal function. The left ventricle has no regional wall motion abnormalities. Left ventricular diastolic parameters are consistent with Grade II diastolic dysfunction (pseudonormalization). Elevated left ventricular end-diastolic pressure.  2. Right ventricular systolic function is normal. The right ventricular size is normal. There is normal pulmonary artery systolic pressure.  3. Left atrial size was mildly dilated.  4. A small pericardial effusion is present. The pericardial effusion is lateral to the left ventricle.  5. The mitral valve is normal in structure. No evidence of mitral valve regurgitation. No evidence of mitral stenosis.  6. The aortic valve is normal in structure. Aortic valve regurgitation is not visualized. Mild to moderate aortic valve sclerosis/calcification is present, without any evidence of aortic stenosis.  7. The inferior  vena cava is dilated in size with <50% respiratory variability, suggesting right atrial pressure of 15 mmHg. FINDINGS  Left Ventricle: Left ventricular ejection fraction, by estimation, is 60 to 65%. The left ventricle has normal function. The left ventricle has no regional wall motion abnormalities. Definity contrast agent was given IV to delineate the left ventricular  endocardial borders. The left ventricular internal cavity size was normal in size. There is no left ventricular hypertrophy. Left ventricular diastolic parameters are consistent with Grade II diastolic dysfunction (pseudonormalization). Elevated left ventricular end-diastolic pressure. Right Ventricle: The right ventricular size is normal. No increase in right ventricular wall thickness. Right ventricular systolic function is normal. There is normal pulmonary artery systolic pressure. The tricuspid regurgitant velocity is 2.50 m/s, and  with an assumed right atrial pressure of 8 mmHg, the estimated right ventricular systolic pressure is 68.0 mmHg. Left Atrium: Left atrial size was mildly dilated. Right Atrium: Right atrial size was normal in size. Pericardium: A small pericardial effusion is present. The pericardial effusion is lateral to the left ventricle. Mitral Valve: The mitral valve is normal in structure. No evidence of mitral valve regurgitation. No evidence of mitral valve stenosis. Tricuspid Valve: The tricuspid valve is normal in structure. Tricuspid valve regurgitation is mild . No evidence of tricuspid stenosis. Aortic Valve: The aortic valve is normal in structure. Aortic valve regurgitation is not visualized. Mild to moderate aortic valve sclerosis/calcification is present, without any evidence of aortic stenosis. Pulmonic Valve: The pulmonic valve was normal in structure. Pulmonic valve regurgitation is not visualized. No evidence of pulmonic stenosis. Aorta: The aortic root is normal in size and structure. Venous: The inferior vena  cava is dilated in size with less than 50% respiratory variability, suggesting right atrial pressure of 15 mmHg. IAS/Shunts: No atrial level shunt detected by color flow Doppler.  LEFT VENTRICLE PLAX 2D LVIDd:         4.51 cm  Diastology LVIDs:         3.11 cm  LV e' medial:    6.31 cm/s LV PW:         0.95 cm  LV E/e' medial:  16.8 LV IVS:        0.93 cm  LV e' lateral:   4.46 cm/s LVOT diam:     2.00 cm  LV E/e' lateral: 23.8 LV SV:         60 LV SV Index:   31 LVOT Area:     3.14 cm  RIGHT VENTRICLE             IVC RV Basal diam:  2.35 cm     IVC diam: 2.30 cm RV S  prime:     14.00 cm/s TAPSE (M-mode): 1.8 cm LEFT ATRIUM             Index LA diam:        3.90 cm 2.03 cm/m LA Vol (A2C):   55.9 ml 29.12 ml/m LA Vol (A4C):   50.3 ml 26.21 ml/m LA Biplane Vol: 53.4 ml 27.82 ml/m  AORTIC VALVE LVOT Vmax:   97.40 cm/s LVOT Vmean:  67.700 cm/s LVOT VTI:    0.192 m  AORTA Ao Root diam: 3.50 cm Ao Asc diam:  3.00 cm MITRAL VALVE                TRICUSPID VALVE MV Area (PHT): 2.62 cm     TR Peak grad:   25.0 mmHg MV Decel Time: 289 msec     TR Vmax:        250.00 cm/s MV E velocity: 106.00 cm/s MV A velocity: 111.00 cm/s  SHUNTS MV E/A ratio:  0.95         Systemic VTI:  0.19 m                             Systemic Diam: 2.00 cm Jenkins Rouge MD Electronically signed by Jenkins Rouge MD Signature Date/Time: 06/29/2020/2:32:24 PM    Final     Cardiac Studies   Echocardiogram 06/29/20: 1. Left ventricular ejection fraction, by estimation, is 60 to 65%. The  left ventricle has normal function. The left ventricle has no regional  wall motion abnormalities. Left ventricular diastolic parameters are  consistent with Grade II diastolic  dysfunction (pseudonormalization). Elevated left ventricular end-diastolic  pressure.   2. Right ventricular systolic function is normal. The right ventricular  size is normal. There is normal pulmonary artery systolic pressure.   3. Left atrial size was mildly dilated.   4. A small  pericardial effusion is present. The pericardial effusion is  lateral to the left ventricle.   5. The mitral valve is normal in structure. No evidence of mitral valve  regurgitation. No evidence of mitral stenosis.   6. The aortic valve is normal in structure. Aortic valve regurgitation is  not visualized. Mild to moderate aortic valve sclerosis/calcification is  present, without any evidence of aortic stenosis.   7. The inferior vena cava is dilated in size with <50% respiratory  variability, suggesting right atrial pressure of 15 mmHg.   Patient Profile     79 y.o. female with a PMH of HTN and shingles, who is being followed by cardiology for possible pericarditis/abnormal EKG.   Assessment & Plan    1. Viral pericarditis: patient presented with AMS 2/2 UTI and possible varicella encephalitis. Subsequently developed EKG changes c/w pericarditis with inferolateral STE and anteroseptal STD. She had no cardiac complaints at that time. Echo showed EF 60-65%, G2DD, no RWMA, mild LAE, small pericardial effusion and no significant valvular abnormalities. ESR/CRP were elevated. She was started on colchicine (3-6 month course) and ibuprofen (2 week course) for presumed pericarditis.  - Continue colchicine for 3-6 months - Continue ibuprofen for 2 weeks - Will start pantoprazole 69m daily for GI ppx while on colchicine and ibuprofen  2. New onset paroxysmal atrial fibrillation: appears to have had an episode of Afib lasting for several hours yesterday morning. Now back in sinus rhythm with 1st degree AV block (baseline rhythm). Rates fairly well controlled when in Afib (80s-100s). Her CHA2DS2-VASc Score is at least 5 (CHF,  HTN, female, Age >87). She is asymptomatic making it difficult to know whether this is her first occurrence of Afib.  - Would hold off on anticoagulation at this time given acute pericarditis - Would consider a cardiac monitor at follow-up to determine Afib burden - Continue  metoprolol for rate control  3. Acute diastolic CHF: noted to have high filling pressures on echo. Diuresing well with po lasix 9m daily. UOP is net -2.0L overnight and -6L this admission. Cr and hyponatremia also improved with diuresis. - Continue lasix 470mdaily - Continue metoprolol and losartan  4. HTN: BP generally stable over the past 24 hours - Continue amlodipine, metoprolol, losartan, and lasix  5. AKI: Cr peaked at 1.5 this admission, down to 0.9 today; baseline 0.8.  - Continue to monitor   Will arrange outpatient follow-up with one of our providers in [place] for post-hospital follow-up. Would have BMET checked with PCP in the interim.   For questions or updates, please contact [facility] consult [email or web address] for contact info under Cardiology/STEMI.      Signed, KrAbigail ButtsPA-C  07/01/2020, 11:41 AM   [phone] Agree with assessment and plan by KrRoby LoftsA-C  Alyssa Hudson being discharged today.  [place] She is being treated for pericarditis.  Apparently she had a short run of asymptomatic A. fib which I suspect is related to her pericarditis.  We will arrange close outpatient follow-up.  She may benefit from having an event monitor in 6 to 8 weeks to see if she has any further A. fib once her pericarditis is adequately treated.  JoLorretta HarpM.D., FAGlen RockFABenefis Health Care ([facility])FALaverta BaltimoreSSimonton278 Pacific RoadSuElwoodNC  [phone]/02/2020 12:51 PM

## 2020-07-01 NOTE — Progress Notes (Signed)
Subjective:   Alyssa Hudson was seen and evaluated at bedside this morning. She is accompanied by her husband, Alyssa Hudson. She reports she is doing well overall. She had some episodes of sweating and feeling hot this morning. She also endorses some short episodes of feeling like her heart is beating fast and shortness of breath. She denies chest pain, abdominal pain, dysuria.  Objective:  Vital signs in last 24 hours: Vitals:   06/30/20 2309 07/01/20 0403 07/01/20 0423 07/01/20 1144  BP: (!) 131/53 (!) 129/59  128/64  Pulse: 79 77  83  Resp: 18 18  18   Temp: 97.6 F (36.4 C) 98.4 F (36.9 C)  98.4 F (36.9 C)  TempSrc: Oral Oral  Oral  SpO2: 94% 95%  96%  Weight:   75.8 kg   Height:      Physical Exam Constitutional:      General: She is not in acute distress.    Appearance: Normal appearance. She is not ill-appearing or toxic-appearing.     Comments: Sitting comfortably in her chair. NAD. Answers questions appropriately.   HENT:     Head: Normocephalic and atraumatic.     Comments: L sided facial droop Eyes:     General:        Right eye: No discharge.        Left eye: No discharge.     Conjunctiva/sclera: Conjunctivae normal.  Cardiovascular:     Rate and Rhythm: Normal rate and regular rhythm.     Pulses: Normal pulses.     Heart sounds: Normal heart sounds. No murmur heard.  No friction rub. No gallop.   Pulmonary:     Effort: Pulmonary effort is normal.     Breath sounds: Normal breath sounds. No wheezing, rhonchi or rales.  Abdominal:     General: Abdomen is flat. Bowel sounds are normal.     Palpations: Abdomen is soft.     Tenderness: There is no abdominal tenderness. There is no guarding.  Neurological:     Mental Status: She is alert and oriented to person, place, and time.     Comments: L sided facial droop  Psychiatric:        Behavior: Behavior normal.     Assessment/Plan:  Principal Problem:   Encephalopathy Active Problems:   Hyponatremia    Viral pericarditis with pericardial effusion   Acute diastolic heart failure (HCC)  Atrial fibrillation in the setting of pericarditis Morning of 10/32021 telemetry picked up bigeminy around 1 AM, as well as intermittent PVCs. Patient denied chest pain, shortness of breath at this time. 06/29/2020 around 6 AM telemetry alarm went off concerning for ST changes and possible STEMI, patient was woken from sleep and denied chest pain, shortness of breath but endorsed diaphoresis. EKG from 6:48 AM showed ST elevations in leads I, II, V5, V6 with ST depressions in V1, V2, V3. Patient had an episode of atrial fibrillation without RVR morning of 06/30/20 around 2230. Echo from 06/29/2020 showed EF 60 to 65% with grade 2 diastolic dysfunction and increased LVEDP. Small pericardial effusion  -There were no events on telemetry last night -Per cardiology she is now on colchicine 0.6 mg twice daily for 3 to 6 months, ibuprofen 600 mg 3 times daily for at least 2 weeks -She is rate controlled with Toprol-XL 100 mg daily -Per cardiology, holding off on anticoagulation due to A. fib being in the setting of acute pericarditis. She will follow up with them outpatient  Acute Encephalopathy -  improved Patient developed seizure-like symptoms acutely the morning of 06/19/20 following severe headache and abdominal pain, in the setting of acute shingles infection. CT head without contrast showed small vessel disease and an equivocal asymmetric increased density of the Right MCA, without acute disease process. MRI brain without contrast showed chronic lacunar infarcts vs. perivascular spaces in the bilateral BG and chronic lacunar infarct of the right caudate with tiny infarcts of bilateral cerebellar hemispheres, but no acute intracranial abnormality. EEG showed cortical dysfunction of the R temporal region and moderate diffuse encephalopathy possibly consistent with post-ictal state. Differential includes VZV encephalitis versus  metabolic encephalopathy 2/2 urinary infection.   - CMV negative.  -Finished course of prophylactic doxycycline for possible tickborne illness -Finished 12-day course of acyclovir/valacyclovir - Blood cultures negative  Active Varicella Zoster Infection Patient was diagnosed with shingles in May of this year, which resolved, but developed recurrence along her left breast/axillary region x 3 weeks. Patient had been in severe pain but had stopped taking her home Valtrex with 4 remaining doses prior to admission.  -Finished 12-day course of acyclovir/valacyclovir -Follow-up with PCP for consideration of prophylactic Valtrex  Hypomagnesemia Patient had nonsustained PVCs and bigeminy noted on telemetry 06/29/2020, when evaluated at bedside she endorsed some chest pain but no shortness of breath. -Magnesium levels on 06/22/2020 were 1.4, trended down to 0.9 on 07/25/2020 -Magnesium levels 06/29/20 on 2.2, 1.7 on 06/30/2020 -Repleted with 1 g today  Urinary retention Patient with a history consistent with a mixed picture of urge and overflow incontinence developed overflow incontinence requiring I/O cath multiple times per day. She has been able to pass a small amount of urine, 100 cc, however shown to retain on bladder scan requiring I&O cath.  -Foley removed today, attempting to get her to pass urine on her own. If she is unable to do so, we will replace Foley and have her follow-up outpatient within a week  UTI with possible pyelonephritis - resolved BUN on admission was 14, trended up to 27. Cr was 0.72, trended up to 2.6. Possibly 2/2 acyclovir, but due to the need for coverage of VZV and HSV encephalitis. U/A showed cloudy urine with large leukocytes, negative nitrites, small hemoglobinuria, no bacteria, >50 WBC, 11-20 RBC and mucus. Triple phosphate crystals present. CT abdomen/pelvis without contrast showed perinephric edema but no nephrolithiasis. Bladder distended with air-fluid level.  Patient afebrile, initial WBC 18.1. Denies associated symptoms at this time.  - Urine culture grew Klebsiella Pneumoniae and Proteus Vulgaris, she was treated with 7 days of ceftazidime as above. - BMP as above  Hypotonic, Hypochloremic Hyponatremia - resolved Initial sodium 123, chloride 86, Serum osms 259, Urine osms and urine sodium inappropriately elevated at 505 and 75, respectively. Patient is on HCTZ 25mg  and Losartan 100mg  daily at home. Patient appears euvolemic today. Creatinine and BUN downtrending. Possibly due to a combined picture of SIADH and home medications HCTZ and Losartan. No ECHO on file although CT abdomen/pelvis showed possible cirrhosis, mild hepatic steatosis.  - Check BMPs   Diverticulosis with possible acute Diverticulitis  Patient has had nausea, vomiting, decreased PO intake, and intermittent severe abdominal pain per patient's husband with benign physical exam aside from moderate abdominal distention. Patient afebrile. CT abdomen pelvis showed colonic diverticulosis with mild pericolonic fat stranding involving the junction of the descending and sigmoid colon in the region of multiple diverticula, suspicious for acute diverticulitis without perforation or abscess. Flagyl DC'd 9/29 - Daily CBC, vitals   AKI - resolved - BUN  and sCr continuing to be WNL  HTN Home meds include HCTZ 25mg , Losartan 100mg , and Metoprolol succinate 100mg  daily  - Continue Norvasc to 10 qD and home metoprolol 100mg  PO qD.  - continue home med losartan 100 mg daily -Start Lasix 40 mg p.o. daily outpatient, not restarting HCTZ due to fluctuating sodium levels   Prior to Admission Living Arrangement: Home Anticipated Discharge Location: Home vs. SNF Barriers to Discharge: Acute encephalopathy, possible infectious etiologies   , Medical Student 07/01/2020, 2:07 PM Pager: [phone] After 5pm on weekdays and 1pm on weekends: On Call pager [phone]

## 2020-07-02 NOTE — Discharge Summary (Signed)
Name: Alyssa ApleyLorene Hudson MRN: [id number] DOB: [birth year] 79 y.o. PCP: Paulina FusiSchultz, Douglas E, MD  Date of Admission: 06/19/2020 11:30 AM Date of Discharge: 07/01/2020 Attending Physician: Dr. Carlynn PurlErik Hoffman  Discharge Diagnosis:  1. Suspected viral encephalitis 2. Viral pericarditis with pericardial effusion 3. Acute diastolic heart failure   Discharge Medications: Allergies as of 07/01/2020      Reactions   Codeine Nausea Only, Nausea And Vomiting   Insomnia   Contrast Media [iodinated Diagnostic Agents] Other (See Comments)   Turned red all over   Iodine-131 Hives, Swelling   Sulfa Antibiotics Nausea Only      Medication List    STOP taking these medications   hydrochlorothiazide 25 MG tablet Commonly known as: HYDRODIURIL   OVER THE COUNTER MEDICATION   valACYclovir 1000 MG tablet Commonly known as: VALTREX     TAKE these medications   acetaminophen 500 MG tablet Commonly known as: TYLENOL Take 500 mg by mouth every 6 (six) hours as needed for headache.   amLODipine 10 MG tablet Commonly known as: NORVASC Take 1 tablet (10 mg total) by mouth daily. What changed:   medication strength  how much to take   Biotin 5000 MCG Tabs Take 5,000 mcg by mouth daily.   colchicine 0.6 MG tablet Take 1 tablet (0.6 mg total) by mouth 2 (two) times daily.   CRANBERRY PO Take 1 tablet by mouth daily.   furosemide 40 MG tablet Commonly known as: LASIX Take 1 tablet (40 mg total) by mouth daily.   gabapentin 300 MG capsule Commonly known as: NEURONTIN Take 300 mg by mouth 3 (three) times daily.   ibuprofen 600 MG tablet Commonly known as: ADVIL Take 1 tablet (600 mg total) by mouth 3 (three) times daily.   LIDOCAINE PAIN RELIEF EX Place 1 patch onto the skin daily as needed (shingles pain).   LIDOCAINE EX Apply 1 application topically 3 (three) times daily as needed (shingles pain). cream   losartan 100 MG tablet Commonly known as: COZAAR Take 100 mg by mouth  daily.   metoprolol succinate 100 MG 24 hr tablet Commonly known as: TOPROL-XL Take 100 mg by mouth daily.   PROBIOTIC PO Take 1 tablet by mouth daily.       Disposition and follow-up:   [facility] Hudson [facility].  At the hospital follow up visit please address:  1.    Active Varicella Zoster Infection - Follow-up with PCP for consideration of prophylactic Valtrex  Atrial fibrillation in the setting of pericarditis - Cardiology follow-up scheduled as below - Colchicine 0.6 mg twice daily for 6 months (end ~April 2022) - Ibuprofen 600 mg 3 times daily for 2 weeks (end date: ~07/14/20)  Hypomagnesemia - Repeat Mg at hospital follow-up  Urinary retention - Screen for any issues with urinary retention   Hypotonic, Hypochloremic Hyponatremia  AKI - Follow-up BMP in 1 to 2 weeks  HTN - Follow-up with PCP for medication management. Discharged on Norvasc 10 mg daily, Losartan 100 mg daily, Toprol 100 mg daily  Disposition - Patient was discharged home with home health. Check-in regarding progress/therapy.  2.  Labs / imaging needed at time of follow-up: BMP, Mg  3.  Pending labs/ test needing follow-up: none  Follow-up Appointments:  Follow-up Information    [facility].   Specialty: Home Health Services Why: The home health agency will contact you for the first home visit Contact information:  [address] [place] [address] [phone]        Thomasene Ripple, DO Follow up on 07/23/2020.   Specialty: Cardiology Why: Please arrive 15 minutes early for your 1:40pm post-hospital cardiology follow-up appointment.  Contact information: [address] [place] [address] [phone]               Hospital Course by problem list:  Alyssa Hudson is an 79 year old woman with a past medical history of recent shingles and carpal tunnel syndrome who was admitted to the  hospital on 06/19/2020 due to altered mental status secondary to viral encephalitis and pyelonephritis.  Suspected Acute Viral Encephalopathy  Patient developed seizure-like symptoms acutely the morning of 06/19/20 following severe headache and abdominal pain, in the setting of acute shingles infection. CT head without contrast showed small vessel disease and an equivocal asymmetric increased density of the Right MCA, without acute disease process. MRI brain without contrast showed chronic lacunar infarcts vs. perivascular spaces in the bilateral BG and chronic lacunar infarct of the right caudate with tiny infarcts of bilateral cerebellar hemispheres, but no acute intracranial abnormality. EEG showed cortical dysfunction of the R temporal region and moderate diffuse encephalopathy possibly consistent with post-ictal state. CSF studies were suggestive of a viral etiology (normal glucose, mildly elevated WBC, mildly elevated RBC, lymphocyte predominance, and increased protein suggestive of viral infection). Differential included VZV encephalitis versus metabolic encephalopathy 2/2 urinary infection. She was treated empirically with acyclovir for 12 days (10 days IV acyclovir + 2 days PO valacyclovir) to cover VZV, doxycycline for 7 days for to cover for possible tickborne illness. Her mentation continued to improve throughout her hospital stay and she returned to her baseline mentation by hospital day 9. - No growth of CSF culture - Cryptococcal antigen negative - HSV IgG CSF from 06/23/20 resulted positive, not diagnostic of HSV encephalitis, subsequent HSV PCR negative - Lyme PCR negative - RMSF antibodies negative - VZV PCR negative - CMV DNA PCR negative  Active Varicella Zoster Infection Patient was diagnosed with shingles in May 2021, which resolved, but developed recurrence along her left breast/axillary region 3 weeks prior to admission. Patient had been in severe pain but had stopped taking her  home Valtrex with 4 remaining doses prior to admission. She completed a 12-day course of acyclovir/valacyclovir during hospitalization. - Follow-up with PCP for consideration of prophylactic Valtrex   Atrial fibrillation in the setting of pericarditis Patient without history of arrhythmia. Toward the end of her hospital stay she started showing bigeminy, PVCs, and going in and out of atrial fibrillation, despite being overall asymptomatic. One episode of atrial fibrillation was captured on EKG, subsequent echo showed normal EF with a small pericardial effusion. She was started on colchicine 0.6 mg twice daily and ibuprofen 600 mg 3 times daily. She will continue the colchicine for 6 months and the ibuprofen for 2 weeks. She is rate controlled with Toprol-XL 100 mg daily. Cardiology decided to hold off on anticoagulation due to the atrial fibrillation being in the setting of acute pericarditis. She will follow up with cardiology outpatient for management of this. - Colchicine 0.6 mg twice daily for 6 months (end ~April 2022) - Ibuprofen 600 mg 3 times daily for 2 weeks (end date: ~07/14/20)  Hypomagnesemia Patient was found to have asymptomatic hypomagnesemia with levels trending down to 0.9 and leveling off around 1.7. Her magnesium was repleted multiple times while in the hospital. She will follow up on this with her PCP. - Repeat  Mg at hospital follow-up  Urinary retention While in the hospital she described a prior history consistent with a mixed picture of urge and overflow incontinence. While in the hospital she developed overflow incontinence requiring I/O cath multiple times per day. Foley catheter was removed on day of discharge and she was able to urinate without problem.   UTI with possible pyelonephritis On admission she had a urinalysis and CT abdomen/pelvis just of pyelonephritis. Due to initial altered mental status, symptoms were unable to be elicited. Her urine culture grew Klebsiella  Pneumoniae and Proteus Vulgaris, and she was treated with 7 days of appropriate antibiotics.   Hypotonic, Hypochloremic Hyponatremia  AKI Initial sodium 123, chloride 86, Serum osms 259, Urine osms and urine sodium inappropriately elevated at 505 and 75, respectively. Patient is on HCTZ 25mg  and Losartan 100mg  daily at home. This was likely due to a combined picture of SIADH and home medications HCTZ and Losartan. Patient also developed an AKI 3 days into starting acyclovir, due to the need for continuing acyclovir for her encephalitis, she was started on LR for maintenance fluids. Her AKI subsequently resolved, BUN/creatinine normalized on discharge.  - Follow-up BMP in 1 to 2 weeks with her PCP.  - Discontinued HCTZ on discharge, continue losartan 100 mg daily.  Diverticulosis with possible acute Diverticulitis  Per patient's husband patient has had nausea, vomiting, decreased PO intake, and intermittent severe abdominal pain prior to coming into the hospital. Physical exam was unremarkable despite some abdominal distention. CT abdomen pelvis showed colonic diverticulosis with mild pericolonic fat stranding involving the junction of the descending and sigmoid colon in the region of multiple diverticula, suspicious for acute diverticulitis without perforation or abscess. She completed an appropriate course of Flagyl.   HTN Patient had a history of hypertension and her home medications included HCTZ 25mg , Losartan 100mg , and Metoprolol succinate 100mg  daily. Due to hyponatremia the HCTZ and losartan were initially held. She continued her Norvasc and metoprolol while in the hospital. Continue her Norvasc, losartan, metoprolol outpatient.  - Follow-up with PCP for medication management. Discharged on Norvasc 10 mg daily, Losartan 100 mg daily, Toprol 100 mg daily    Discharge Vitals:   BP 130/66 (BP Location: Right Arm)    Pulse 88    Temp 97.6 F (36.4 C) (Oral)    Resp 18    Ht 5\' 5"  (1.651 m)     Wt 75.8 kg    SpO2 98%    BMI 27.79 kg/m   Pertinent Labs, Studies, and Procedures:   06/19/20 CT HEAD WITHOUT CONTRAST  FINDINGS: Brain: No midline shift, mass effect, or evidence of intracranial mass lesion. No ventriculomegaly. No acute intracranial hemorrhage identified.  Scattered bilateral hypodensity in the cerebral white matter, basal ganglia. The thalami, brainstem and cerebellum appear spared. No superimposed cortical encephalomalacia identified. No cortically based acute infarct identified.  Vascular: Calcified atherosclerosis at the skull base. There is mildly asymmetric increased density of the right MCA, best seen on sagittal image 17.  Skull: No acute osseous abnormality identified.  Sinuses/Orbits: Well pneumatized aside from left ethmoid mucosal thickening.  Other: No acute orbit or scalp soft tissue finding.  ASPECTS [facility] Stroke Program Early CT Score)  Total score (0-10 with 10 being normal): 10  IMPRESSION: 1. Equivocal asymmetric increased density of the Right MCA, but no acute cortically based infarct or acute intracranial hemorrhage identified. ASPECTS 10. 2. Evidence of chronic small vessel disease. 3. These results were discussed Dr. at 11:40hours  by Telephone.  06/19/20 MRI HEAD WITHOUT AND WITH CONTRAST  FINDINGS: Study is intermittently degraded by motion artifact despite repeated imaging attempts.  Brain: No convincing restricted diffusion. Physiologic anisotropy is felt to explain a small area of increased trace DWI signal in the central midbrain (series 3, image 22) which remains normal on the remaining sequences.  Widely scattered and mostly subcortical bilateral white matter T2 and FLAIR hyperintensity. Chronic lacunar infarcts versus perivascular spaces in the bilateral basal ganglia. There is a chronic lacunar infarct of the right caudate superimposed. Negative brainstem. Several tiny chronic  infarcts in both cerebellar hemispheres.  No midline shift, mass effect, evidence of mass lesion, ventriculomegaly, extra-axial collection or acute intracranial hemorrhage. Cervicomedullary junction and pituitary are within normal limits. No chronic cerebral blood products identified. Following contrast no abnormal enhancement is identified. No dural thickening is evident.  Vascular: Major intracranial vascular flow voids are preserved.  Skull and upper cervical spine: Normal for age visible cervical spine, bone marrow signal.  Sinuses/Orbits: Negative.  Other: None  IMPRESSION: Mildly motion degraded study but no acute intracranial abnormality is identified.  Chronic small vessel disease.  06/19/20 DG C-ARM 1-60 MIN  FLUOROSCOPY TIME:  Fluoroscopy Time:  18 seconds  Radiation Exposure Index (if provided by the fluoroscopic device): 4.60 mGy.  Number of Acquired Spot Images: 0  COMPARISON:  No pertinent prior exams are available for comparison.  FINDINGS: Fluoroscopy was utilized to assess for an appropriate entry site for lumbar puncture. However, the procedure was aborted prior to site marking or needle entry due to patient altered mental status, restlessness and agitation.  IMPRESSION: Aborted fluoroscopically-guided lumbar puncture due to patient altered mental status, restlessness and agitation.   Discharge Instructions: Discharge Instructions    Diet - low sodium heart healthy   Complete by: As directed    Diet - low sodium heart healthy   Complete by: As directed    Increase activity slowly   Complete by: As directed    Increase activity slowly   Complete by: As directed       Signed: Alphonzo Severance, MD 07/02/2020, 4:11 PM   Pager: [phone]

## 2020-07-04 DIAGNOSIS — G5600 Carpal tunnel syndrome, unspecified upper limb: Secondary | ICD-10-CM | POA: Diagnosis not present

## 2020-07-04 DIAGNOSIS — I110 Hypertensive heart disease with heart failure: Secondary | ICD-10-CM | POA: Diagnosis not present

## 2020-07-04 DIAGNOSIS — N12 Tubulo-interstitial nephritis, not specified as acute or chronic: Secondary | ICD-10-CM | POA: Diagnosis not present

## 2020-07-04 DIAGNOSIS — I4891 Unspecified atrial fibrillation: Secondary | ICD-10-CM | POA: Diagnosis not present

## 2020-07-04 DIAGNOSIS — G0490 Encephalitis and encephalomyelitis, unspecified: Secondary | ICD-10-CM | POA: Diagnosis not present

## 2020-07-04 DIAGNOSIS — I318 Other specified diseases of pericardium: Secondary | ICD-10-CM | POA: Diagnosis not present

## 2020-07-04 DIAGNOSIS — I5031 Acute diastolic (congestive) heart failure: Secondary | ICD-10-CM | POA: Diagnosis not present

## 2020-07-04 DIAGNOSIS — B3321 Viral endocarditis: Secondary | ICD-10-CM | POA: Diagnosis not present

## 2020-07-04 DIAGNOSIS — B0222 Postherpetic trigeminal neuralgia: Secondary | ICD-10-CM | POA: Diagnosis not present

## 2020-07-07 DIAGNOSIS — B0222 Postherpetic trigeminal neuralgia: Secondary | ICD-10-CM | POA: Diagnosis not present

## 2020-07-07 DIAGNOSIS — N12 Tubulo-interstitial nephritis, not specified as acute or chronic: Secondary | ICD-10-CM | POA: Diagnosis not present

## 2020-07-07 DIAGNOSIS — B3321 Viral endocarditis: Secondary | ICD-10-CM | POA: Diagnosis not present

## 2020-07-07 DIAGNOSIS — I318 Other specified diseases of pericardium: Secondary | ICD-10-CM | POA: Diagnosis not present

## 2020-07-07 DIAGNOSIS — I110 Hypertensive heart disease with heart failure: Secondary | ICD-10-CM | POA: Diagnosis not present

## 2020-07-07 DIAGNOSIS — I4891 Unspecified atrial fibrillation: Secondary | ICD-10-CM | POA: Diagnosis not present

## 2020-07-07 DIAGNOSIS — I5031 Acute diastolic (congestive) heart failure: Secondary | ICD-10-CM | POA: Diagnosis not present

## 2020-07-07 DIAGNOSIS — G0490 Encephalitis and encephalomyelitis, unspecified: Secondary | ICD-10-CM | POA: Diagnosis not present

## 2020-07-07 DIAGNOSIS — G5600 Carpal tunnel syndrome, unspecified upper limb: Secondary | ICD-10-CM | POA: Diagnosis not present

## 2020-07-08 DIAGNOSIS — K5792 Diverticulitis of intestine, part unspecified, without perforation or abscess without bleeding: Secondary | ICD-10-CM | POA: Diagnosis not present

## 2020-07-08 DIAGNOSIS — G9340 Encephalopathy, unspecified: Secondary | ICD-10-CM | POA: Diagnosis not present

## 2020-07-08 DIAGNOSIS — I10 Essential (primary) hypertension: Secondary | ICD-10-CM | POA: Diagnosis not present

## 2020-07-08 DIAGNOSIS — I5031 Acute diastolic (congestive) heart failure: Secondary | ICD-10-CM | POA: Diagnosis not present

## 2020-07-08 DIAGNOSIS — N390 Urinary tract infection, site not specified: Secondary | ICD-10-CM | POA: Diagnosis not present

## 2020-07-08 DIAGNOSIS — I301 Infective pericarditis: Secondary | ICD-10-CM | POA: Diagnosis not present

## 2020-07-08 DIAGNOSIS — E871 Hypo-osmolality and hyponatremia: Secondary | ICD-10-CM | POA: Diagnosis not present

## 2020-07-08 DIAGNOSIS — I480 Paroxysmal atrial fibrillation: Secondary | ICD-10-CM | POA: Diagnosis not present

## 2020-07-09 DIAGNOSIS — B3321 Viral endocarditis: Secondary | ICD-10-CM | POA: Diagnosis not present

## 2020-07-09 DIAGNOSIS — I110 Hypertensive heart disease with heart failure: Secondary | ICD-10-CM | POA: Diagnosis not present

## 2020-07-09 DIAGNOSIS — N12 Tubulo-interstitial nephritis, not specified as acute or chronic: Secondary | ICD-10-CM | POA: Diagnosis not present

## 2020-07-09 DIAGNOSIS — B0222 Postherpetic trigeminal neuralgia: Secondary | ICD-10-CM | POA: Diagnosis not present

## 2020-07-09 DIAGNOSIS — I318 Other specified diseases of pericardium: Secondary | ICD-10-CM | POA: Diagnosis not present

## 2020-07-09 DIAGNOSIS — I4891 Unspecified atrial fibrillation: Secondary | ICD-10-CM | POA: Diagnosis not present

## 2020-07-09 DIAGNOSIS — I5031 Acute diastolic (congestive) heart failure: Secondary | ICD-10-CM | POA: Diagnosis not present

## 2020-07-09 DIAGNOSIS — G0490 Encephalitis and encephalomyelitis, unspecified: Secondary | ICD-10-CM | POA: Diagnosis not present

## 2020-07-09 DIAGNOSIS — G5600 Carpal tunnel syndrome, unspecified upper limb: Secondary | ICD-10-CM | POA: Diagnosis not present

## 2020-07-10 DIAGNOSIS — R6889 Other general symptoms and signs: Secondary | ICD-10-CM | POA: Diagnosis not present

## 2020-07-22 DIAGNOSIS — Z1331 Encounter for screening for depression: Secondary | ICD-10-CM | POA: Diagnosis not present

## 2020-07-22 DIAGNOSIS — Z0000 Encounter for general adult medical examination without abnormal findings: Secondary | ICD-10-CM | POA: Diagnosis not present

## 2020-07-22 DIAGNOSIS — Z9181 History of falling: Secondary | ICD-10-CM | POA: Diagnosis not present

## 2020-07-22 DIAGNOSIS — E669 Obesity, unspecified: Secondary | ICD-10-CM | POA: Diagnosis not present

## 2020-07-22 DIAGNOSIS — E785 Hyperlipidemia, unspecified: Secondary | ICD-10-CM | POA: Diagnosis not present

## 2020-07-23 ENCOUNTER — Ambulatory Visit (INDEPENDENT_AMBULATORY_CARE_PROVIDER_SITE_OTHER): Payer: Medicare PPO

## 2020-07-23 ENCOUNTER — Ambulatory Visit: Payer: Medicare PPO | Admitting: Cardiology

## 2020-07-23 ENCOUNTER — Other Ambulatory Visit: Payer: Self-pay

## 2020-07-23 ENCOUNTER — Encounter: Payer: Self-pay | Admitting: Cardiology

## 2020-07-23 VITALS — BP 128/56 | HR 88 | Ht 65.0 in | Wt 181.6 lb

## 2020-07-23 DIAGNOSIS — I480 Paroxysmal atrial fibrillation: Secondary | ICD-10-CM | POA: Diagnosis not present

## 2020-07-23 DIAGNOSIS — I5031 Acute diastolic (congestive) heart failure: Secondary | ICD-10-CM

## 2020-07-23 DIAGNOSIS — I5032 Chronic diastolic (congestive) heart failure: Secondary | ICD-10-CM

## 2020-07-23 DIAGNOSIS — I4891 Unspecified atrial fibrillation: Secondary | ICD-10-CM

## 2020-07-23 DIAGNOSIS — I10 Essential (primary) hypertension: Secondary | ICD-10-CM | POA: Diagnosis not present

## 2020-07-23 DIAGNOSIS — I309 Acute pericarditis, unspecified: Secondary | ICD-10-CM | POA: Diagnosis not present

## 2020-07-23 DIAGNOSIS — I471 Supraventricular tachycardia: Secondary | ICD-10-CM

## 2020-07-23 NOTE — Progress Notes (Signed)
Cardiology Office Note:    Date:  07/24/2020   ID:  Alyssa Hudson, DOB Oct 30, 1940, MRN [id number]  PCP:  Paulina Fusi, MD  Cardiologist:  Thomasene Ripple, DO  Electrophysiologist:  None   Referring MD: Paulina Fusi, MD   " I am doing well"  History of Present Illness:    Alyssa Hudson is a 79 y.o. female with a hx of diastolic heart failure, recently diagnosed large pericarditis with pericardial fusion currently being treated with colchicine planned for a total of 6 months, recent history of COVID-19 infection, hypertension, paroxysmal atrial fibrillation during her hospitalization presents today for posthospitalization follow-up.  During er hospitalization she was suspected to have acute encephalopath in the setting of her shingle infection. His CT scan  Of the head without contrast showed small vessel disease and an equivocal asymmetric increased density of the Right MCA, without acute disease process. MRI brain without contrast showed chronic lacunar infarcts vs. perivascular spaces in the bilateral BG and chronic lacunar infarct of the right caudate with tiny infarcts of bilateral cerebellar hemispheres, but no acute intracranial abnormality. EEG showed cortical dysfunction of the R temporal region and moderate diffuse encephalopathy possibly consistent with post-ictal state. CSF studies were suggestive of a viral etiology.  In addition she was had multiple brief episodes of atrial fibrillation but since in the setting of her pericarditis anticoagulation was held.   She is here today for her follow up visit. She tells me she was doing well after discharge but then was noted to be covid 19 positive. Her physical therapy was stopped- this is being resumed as she has completed her quarantine   .  Past Medical History:  Diagnosis Date  . Acute diastolic heart failure (HCC) 06/30/2020  . Aftercare following surgery 06/09/2016  . Encephalopathy 06/19/2020  . Facial droop   .  Hypertension   . Hyponatremia 06/20/2020  . Viral pericarditis with pericardial effusion 06/30/2020    Past Surgical History:  Procedure Laterality Date  . IR FLUORO GUIDED NEEDLE PLC ASPIRATION/INJECTION LOC  06/23/2020  . RADIOLOGY WITH ANESTHESIA N/A 06/23/2020   Procedure: IR WITH ANESTHESIA;  Surgeon: Radiologist, Medication, MD;  Location: [facility] OR;  Service: Radiology;  Laterality: N/A;    Current Medications: Current Meds  Medication Sig  . acetaminophen (TYLENOL) 500 MG tablet Take 500 mg by mouth every 6 (six) hours as needed for headache.  Marland Kitchen amLODipine (NORVASC) 10 MG tablet Take 1 tablet (10 mg total) by mouth daily.  . Biotin 5000 MCG TABS Take 5,000 mcg by mouth daily.  . colchicine 0.6 MG tablet Take 1 tablet (0.6 mg total) by mouth 2 (two) times daily.  Marland Kitchen CRANBERRY PO Take 1 tablet by mouth daily.  . furosemide (LASIX) 40 MG tablet Take 1 tablet (40 mg total) by mouth daily.  Marland Kitchen gabapentin (NEURONTIN) 300 MG capsule Take 300 mg by mouth 3 (three) times daily.   Marland Kitchen ibuprofen (ADVIL) 600 MG tablet Take 1 tablet (600 mg total) by mouth 3 (three) times daily.  Marland Kitchen LIDOCAINE EX Apply 1 application topically 3 (three) times daily as needed (shingles pain). cream  . LIDOCAINE PAIN RELIEF EX Place 1 patch onto the skin daily as needed (shingles pain).  Marland Kitchen losartan (COZAAR) 100 MG tablet Take 100 mg by mouth daily.  . metoprolol succinate (TOPROL-XL) 100 MG 24 hr tablet Take 100 mg by mouth daily.  . Probiotic Product (PROBIOTIC PO) Take 1 tablet by mouth daily.     Allergies:  Codeine, Contrast media [iodinated diagnostic agents], Iodine-131, and Sulfa antibiotics   Social History   Socioeconomic History  . Marital status: Married    Spouse name: Not on file  . Number of children: Not on file  . Years of education: Not on file  . Highest education level: Not on file  Occupational History  . Not on file  Tobacco Use  . Smoking status: Former Games developermoker  . Smokeless tobacco: Never  Used  Substance and Sexual Activity  . Alcohol use: Not on file  . Drug use: Not on file  . Sexual activity: Not on file  Other Topics Concern  . Not on file  Social History Narrative  . Not on file   Social Determinants of Health   Financial Resource Strain:   . Difficulty of Paying Living Expenses: Not on file  Food Insecurity:   . Worried About Programme researcher, broadcasting/film/videounning Out of Food in the Last Year: Not on file  . Ran Out of Food in the Last Year: Not on file  Transportation Needs:   . Lack of Transportation (Medical): Not on file  . Lack of Transportation (Non-Medical): Not on file  Physical Activity:   . Days of Exercise per Week: Not on file  . Minutes of Exercise per Session: Not on file  Stress:   . Feeling of Stress : Not on file  Social Connections:   . Frequency of Communication with Friends and Family: Not on file  . Frequency of Social Gatherings with Friends and Family: Not on file  . Attends Religious Services: Not on file  . Active Member of Clubs or Organizations: Not on file  . Attends BankerClub or Organization Meetings: Not on file  . Marital Status: Not on file     Family History: The patient's family history includes Alzheimer's disease (age of onset: 1672) in her sister; Muscular dystrophy in her brother; Prostate cancer (age of onset: 5261) in her father.  ROS:   Review of Systems  Constitution: Negative for decreased appetite, fever and weight gain.  HENT: Negative for congestion, ear discharge, hoarse voice and sore throat.   Eyes: Negative for discharge, redness, vision loss in right eye and visual halos.  Cardiovascular: Negative for chest pain, dyspnea on exertion, leg swelling, orthopnea and palpitations.  Respiratory: Negative for cough, hemoptysis, shortness of breath and snoring.   Endocrine: Negative for heat intolerance and polyphagia.  Hematologic/Lymphatic: Negative for bleeding problem. Does not bruise/bleed easily.  Skin: Negative for flushing, nail changes,  rash and suspicious lesions.  Musculoskeletal: Negative for arthritis, joint pain, muscle cramps, myalgias, neck pain and stiffness.  Gastrointestinal: Negative for abdominal pain, bowel incontinence, diarrhea and excessive appetite.  Genitourinary: Negative for decreased libido, genital sores and incomplete emptying.  Neurological: Negative for brief paralysis, focal weakness, headaches and loss of balance.  Psychiatric/Behavioral: Negative for altered mental status, depression and suicidal ideas.  Allergic/Immunologic: Negative for HIV exposure and persistent infections.    EKGs/Labs/Other Studies Reviewed:    The following studies were reviewed today:   EKG:  The ekg ordered today demonstrates   Recent Labs: 06/29/2020: ALT 13; B Natriuretic Peptide 165.4; Hemoglobin 10.3; Platelets 235; TSH 4.215 07/23/2020: BUN 11; Creatinine, Ser 0.82; Magnesium 1.9; Potassium 4.2; Sodium 141  Recent Lipid Panel No results found for: CHOL, TRIG, HDL, CHOLHDL, VLDL, LDLCALC, LDLDIRECT  Physical Exam:    VS:  BP (!) 128/56   Pulse 88   Ht 5\' 5"  (1.651 m)   Wt 181 lb 9.6  oz (82.4 kg)   SpO2 96%   BMI 30.22 kg/m     Wt Readings from Last 3 Encounters:  07/23/20 181 lb 9.6 oz (82.4 kg)  07/01/20 167 lb (75.8 kg)     GEN: Well nourished, well developed in no acute distress HEENT: Normal NECK: No JVD; No carotid bruits LYMPHATICS: No lymphadenopathy CARDIAC: S1S2 noted,RRR, no murmurs, rubs, gallops RESPIRATORY:  Clear to auscultation without rales, wheezing or rhonchi  ABDOMEN: Soft, non-tender, non-distended, +bowel sounds, no guarding. EXTREMITIES: No edema, No cyanosis, no clubbing MUSCULOSKELETAL:  No deformity  SKIN: Warm and dry NEUROLOGIC:  Alert and oriented x 3, non-focal PSYCHIATRIC:  Normal affect, good insight  ASSESSMENT:    1. Acute  viral pericarditis with pericardial effusion   2. Chronic diastolic heart failure (HCC)   3. Paroxysmal atrial fibrillation (HCC)   4.  Primary hypertension    PLAN:     1. She remains on colchicine which has been planned for 6 months or so her and they will be in April 2022.  I spoke with the patient about this she understands.  In terms of her paroxysmal atrial fibrillation I like to place a monitor on the patient to understand how often she is going into A. fib.  Once we get this information we will repeat her echocardiogram to make sure that she has no remaining pericardial fusion.  And we can make decisions for her anticoagulation.  Her chads vas is 4 and we discussed that she will be a candidate for anticoagulation.  She also prefers to wait and see what the monitor which showed. Her blood pressure deceptively in the office today.  No changes will be made to her medication regimen. She is recovering well from her recent COVID-19 infection so she is looking forward to getting her physical therapy again. I will get blood work today to assess her BMP and mag as well.  The patient is in agreement with the above plan. The patient left the office in stable condition.  The patient will follow up in 8 weeks.   Medication Adjustments/Labs and Tests Ordered: Current medicines are reviewed at length with the patient today.  Concerns regarding medicines are outlined above.  Orders Placed This Encounter  Procedures  . Basic metabolic panel  . Magnesium  . LONG TERM MONITOR (3-14 DAYS)   No orders of the defined types were placed in this encounter.   Patient Instructions  Medication Instructions:  Your physician recommends that you continue on your current medications as directed. Please refer to the Current Medication list given to you today.  *If you need a refill on your cardiac medications before your next appointment, please call your pharmacy*   Lab Work: Your physician recommends that you return for lab work in: TODAY BMP, Mag If you have labs (blood work) drawn today and your tests are completely normal, you will  receive your results only by: Marland Kitchen MyChart Message (if you have MyChart) OR . A paper copy in the mail If you have any lab test that is abnormal or we need to change your treatment, we will call you to review the results.   Testing/Procedures: A zio monitor was ordered today. It will remain on for 14 days. You will then return monitor and event diary in provided box. It takes 1-2 weeks for report to be downloaded and returned to [place]. We will call you with the results. If monitor falls off or has orange flashing light, please call  Zio for further instructions.      Follow-Up: At [facility], you and your health needs are our priority.  As part of our continuing mission to provide you with exceptional heart care, we have created designated Provider Care Teams.  These Care Teams include your primary Cardiologist (physician) and Advanced Practice Providers (APPs -  Physician Assistants and Nurse Practitioners) who all work together to provide you with the care you need, when you need it.  We recommend signing up for the patient portal called "MyChart".  Sign up information is provided on this After Visit Summary.  MyChart is used to connect with patients for Virtual Visits (Telemedicine).  Patients are able to view lab/test results, encounter notes, upcoming appointments, etc.  Non-urgent messages can be sent to your provider as well.   To learn more about what you can do with MyChart, go to [email or web address].    Your next appointment:   8 week(s)  The format for your next appointment:   In Person  Provider:   Thomasene Ripple, DO   Other Instructions      Adopting a Healthy Lifestyle.  Know what a healthy weight is for you (roughly BMI <25) and aim to maintain this   Aim for 7+ servings of fruits and vegetables daily   65-80+ fluid ounces of water or unsweet tea for healthy kidneys   Limit to max 1 drink of alcohol per day; avoid smoking/tobacco   Limit animal fats in diet for  cholesterol and heart health - choose grass fed whenever available   Avoid highly processed foods, and foods high in saturated/trans fats   Aim for low stress - take time to unwind and care for your mental health   Aim for 150 min of moderate intensity exercise weekly for heart health, and weights twice weekly for bone health   Aim for 7-9 hours of sleep daily   When it comes to diets, agreement about the perfect plan isnt easy to find, even among the experts. Experts at the [facility] [employer] developed an idea known as the Healthy Eating Plate. Just imagine a plate divided into logical, healthy portions.   The emphasis is on diet quality:   Load up on vegetables and fruits - one-half of your plate: Aim for color and variety, and remember that potatoes dont count.   Go for whole grains - one-quarter of your plate: Whole wheat, barley, wheat berries, quinoa, oats, brown rice, and foods made with them. If you want pasta, go with whole wheat pasta.   Protein power - one-quarter of your plate: Fish, chicken, beans, and nuts are all healthy, versatile protein sources. Limit red meat.   The diet, however, does go beyond the plate, offering a few other suggestions.   Use healthy plant oils, such as olive, canola, soy, corn, sunflower and peanut. Check the labels, and avoid partially hydrogenated oil, which have unhealthy trans fats.   If youre thirsty, drink water. Coffee and tea are good in moderation, but skip sugary drinks and limit milk and dairy products to one or two daily servings.   The type of carbohydrate in the diet is more important than the amount. Some sources of carbohydrates, such as vegetables, fruits, whole grains, and beans-are healthier than others.   Finally, stay active  Signed, Thomasene Ripple, DO  07/24/2020 8:17 PM    [facility]

## 2020-07-23 NOTE — Patient Instructions (Signed)
Medication Instructions:  Your physician recommends that you continue on your current medications as directed. Please refer to the Current Medication list given to you today.  *If you need a refill on your cardiac medications before your next appointment, please call your pharmacy*   Lab Work: Your physician recommends that you return for lab work in: TODAY BMP, Mag If you have labs (blood work) drawn today and your tests are completely normal, you will receive your results only by: Marland Kitchen MyChart Message (if you have MyChart) OR . A paper copy in the mail If you have any lab test that is abnormal or we need to change your treatment, we will call you to review the results.   Testing/Procedures: A zio monitor was ordered today. It will remain on for 14 days. You will then return monitor and event diary in provided box. It takes 1-2 weeks for report to be downloaded and returned to [place]. We will call you with the results. If monitor falls off or has orange flashing light, please call Zio for further instructions.      Follow-Up: At [facility], you and your health needs are our priority.  As part of our continuing mission to provide you with exceptional heart care, we have created designated Provider Care Teams.  These Care Teams include your primary Cardiologist (physician) and Advanced Practice Providers (APPs -  Physician Assistants and Nurse Practitioners) who all work together to provide you with the care you need, when you need it.  We recommend signing up for the patient portal called "MyChart".  Sign up information is provided on this After Visit Summary.  MyChart is used to connect with patients for Virtual Visits (Telemedicine).  Patients are able to view lab/test results, encounter notes, upcoming appointments, etc.  Non-urgent messages can be sent to your provider as well.   To learn more about what you can do with MyChart, go to [email or web address].    Your next appointment:    8 week(s)  The format for your next appointment:   In Person  Provider:   Thomasene Ripple, DO   Other Instructions

## 2020-07-24 DIAGNOSIS — B0222 Postherpetic trigeminal neuralgia: Secondary | ICD-10-CM | POA: Diagnosis not present

## 2020-07-24 DIAGNOSIS — I4891 Unspecified atrial fibrillation: Secondary | ICD-10-CM | POA: Insufficient documentation

## 2020-07-24 DIAGNOSIS — G0490 Encephalitis and encephalomyelitis, unspecified: Secondary | ICD-10-CM | POA: Diagnosis not present

## 2020-07-24 DIAGNOSIS — I10 Essential (primary) hypertension: Secondary | ICD-10-CM | POA: Insufficient documentation

## 2020-07-24 DIAGNOSIS — I110 Hypertensive heart disease with heart failure: Secondary | ICD-10-CM | POA: Diagnosis not present

## 2020-07-24 DIAGNOSIS — I309 Acute pericarditis, unspecified: Secondary | ICD-10-CM | POA: Insufficient documentation

## 2020-07-24 DIAGNOSIS — G5600 Carpal tunnel syndrome, unspecified upper limb: Secondary | ICD-10-CM | POA: Diagnosis not present

## 2020-07-24 DIAGNOSIS — B3321 Viral endocarditis: Secondary | ICD-10-CM | POA: Diagnosis not present

## 2020-07-24 DIAGNOSIS — I318 Other specified diseases of pericardium: Secondary | ICD-10-CM | POA: Diagnosis not present

## 2020-07-24 DIAGNOSIS — I5031 Acute diastolic (congestive) heart failure: Secondary | ICD-10-CM | POA: Diagnosis not present

## 2020-07-24 DIAGNOSIS — N12 Tubulo-interstitial nephritis, not specified as acute or chronic: Secondary | ICD-10-CM | POA: Diagnosis not present

## 2020-07-24 HISTORY — DX: Acute pericarditis, unspecified: I30.9

## 2020-07-24 HISTORY — DX: Unspecified atrial fibrillation: I48.91

## 2020-07-24 HISTORY — DX: Essential (primary) hypertension: I10

## 2020-07-24 LAB — BASIC METABOLIC PANEL
BUN/Creatinine Ratio: 13 (ref 12–28)
BUN: 11 mg/dL (ref 8–27)
CO2: 31 mmol/L — ABNORMAL HIGH (ref 20–29)
Calcium: 9.5 mg/dL (ref 8.7–10.3)
Chloride: 96 mmol/L (ref 96–106)
Creatinine, Ser: 0.82 mg/dL (ref 0.57–1.00)
GFR calc Af Amer: 78 mL/min/{1.73_m2} (ref >59)
GFR calc non Af Amer: 67 mL/min/{1.73_m2} (ref >59)
Glucose: 96 mg/dL (ref 65–99)
Potassium: 4.2 mmol/L (ref 3.5–5.2)
Sodium: 141 mmol/L (ref 134–144)

## 2020-07-24 LAB — MAGNESIUM: Magnesium: 1.9 mg/dL (ref 1.6–2.3)

## 2020-07-27 DIAGNOSIS — N12 Tubulo-interstitial nephritis, not specified as acute or chronic: Secondary | ICD-10-CM | POA: Diagnosis not present

## 2020-07-27 DIAGNOSIS — I4891 Unspecified atrial fibrillation: Secondary | ICD-10-CM | POA: Diagnosis not present

## 2020-07-27 DIAGNOSIS — I110 Hypertensive heart disease with heart failure: Secondary | ICD-10-CM | POA: Diagnosis not present

## 2020-07-27 DIAGNOSIS — B0222 Postherpetic trigeminal neuralgia: Secondary | ICD-10-CM | POA: Diagnosis not present

## 2020-07-27 DIAGNOSIS — I318 Other specified diseases of pericardium: Secondary | ICD-10-CM | POA: Diagnosis not present

## 2020-07-27 DIAGNOSIS — B3321 Viral endocarditis: Secondary | ICD-10-CM | POA: Diagnosis not present

## 2020-07-27 DIAGNOSIS — G5600 Carpal tunnel syndrome, unspecified upper limb: Secondary | ICD-10-CM | POA: Diagnosis not present

## 2020-07-27 DIAGNOSIS — G0490 Encephalitis and encephalomyelitis, unspecified: Secondary | ICD-10-CM | POA: Diagnosis not present

## 2020-07-27 DIAGNOSIS — I5031 Acute diastolic (congestive) heart failure: Secondary | ICD-10-CM | POA: Diagnosis not present

## 2020-07-29 DIAGNOSIS — G0490 Encephalitis and encephalomyelitis, unspecified: Secondary | ICD-10-CM | POA: Diagnosis not present

## 2020-07-29 DIAGNOSIS — I318 Other specified diseases of pericardium: Secondary | ICD-10-CM | POA: Diagnosis not present

## 2020-07-29 DIAGNOSIS — I4891 Unspecified atrial fibrillation: Secondary | ICD-10-CM | POA: Diagnosis not present

## 2020-07-29 DIAGNOSIS — B3321 Viral endocarditis: Secondary | ICD-10-CM | POA: Diagnosis not present

## 2020-07-29 DIAGNOSIS — I110 Hypertensive heart disease with heart failure: Secondary | ICD-10-CM | POA: Diagnosis not present

## 2020-07-29 DIAGNOSIS — B0222 Postherpetic trigeminal neuralgia: Secondary | ICD-10-CM | POA: Diagnosis not present

## 2020-07-29 DIAGNOSIS — N12 Tubulo-interstitial nephritis, not specified as acute or chronic: Secondary | ICD-10-CM | POA: Diagnosis not present

## 2020-07-29 DIAGNOSIS — G5600 Carpal tunnel syndrome, unspecified upper limb: Secondary | ICD-10-CM | POA: Diagnosis not present

## 2020-07-29 DIAGNOSIS — I5031 Acute diastolic (congestive) heart failure: Secondary | ICD-10-CM | POA: Diagnosis not present

## 2020-08-03 DIAGNOSIS — I5031 Acute diastolic (congestive) heart failure: Secondary | ICD-10-CM | POA: Diagnosis not present

## 2020-08-03 DIAGNOSIS — I110 Hypertensive heart disease with heart failure: Secondary | ICD-10-CM | POA: Diagnosis not present

## 2020-08-03 DIAGNOSIS — G5600 Carpal tunnel syndrome, unspecified upper limb: Secondary | ICD-10-CM | POA: Diagnosis not present

## 2020-08-03 DIAGNOSIS — G518 Other disorders of facial nerve: Secondary | ICD-10-CM | POA: Diagnosis not present

## 2020-08-03 DIAGNOSIS — I4891 Unspecified atrial fibrillation: Secondary | ICD-10-CM | POA: Diagnosis not present

## 2020-08-03 DIAGNOSIS — G0490 Encephalitis and encephalomyelitis, unspecified: Secondary | ICD-10-CM | POA: Diagnosis not present

## 2020-08-03 DIAGNOSIS — I318 Other specified diseases of pericardium: Secondary | ICD-10-CM | POA: Diagnosis not present

## 2020-08-03 DIAGNOSIS — B3321 Viral endocarditis: Secondary | ICD-10-CM | POA: Diagnosis not present

## 2020-08-03 DIAGNOSIS — N12 Tubulo-interstitial nephritis, not specified as acute or chronic: Secondary | ICD-10-CM | POA: Diagnosis not present

## 2020-08-03 DIAGNOSIS — B0222 Postherpetic trigeminal neuralgia: Secondary | ICD-10-CM | POA: Diagnosis not present

## 2020-08-05 ENCOUNTER — Telehealth: Payer: Self-pay | Admitting: Cardiology

## 2020-08-05 DIAGNOSIS — I110 Hypertensive heart disease with heart failure: Secondary | ICD-10-CM | POA: Diagnosis not present

## 2020-08-05 DIAGNOSIS — I5031 Acute diastolic (congestive) heart failure: Secondary | ICD-10-CM | POA: Diagnosis not present

## 2020-08-05 DIAGNOSIS — B3321 Viral endocarditis: Secondary | ICD-10-CM | POA: Diagnosis not present

## 2020-08-05 DIAGNOSIS — B0222 Postherpetic trigeminal neuralgia: Secondary | ICD-10-CM | POA: Diagnosis not present

## 2020-08-05 DIAGNOSIS — G0490 Encephalitis and encephalomyelitis, unspecified: Secondary | ICD-10-CM | POA: Diagnosis not present

## 2020-08-05 DIAGNOSIS — N12 Tubulo-interstitial nephritis, not specified as acute or chronic: Secondary | ICD-10-CM | POA: Diagnosis not present

## 2020-08-05 DIAGNOSIS — I318 Other specified diseases of pericardium: Secondary | ICD-10-CM | POA: Diagnosis not present

## 2020-08-05 DIAGNOSIS — G5600 Carpal tunnel syndrome, unspecified upper limb: Secondary | ICD-10-CM | POA: Diagnosis not present

## 2020-08-05 DIAGNOSIS — I4891 Unspecified atrial fibrillation: Secondary | ICD-10-CM | POA: Diagnosis not present

## 2020-08-05 NOTE — Telephone Encounter (Signed)
Pt c/o medication issue:  1. Name of Medication: colchicine 0.6 MG tablet  2. How are you currently taking this medication (dosage and times per day)? One tablet twice daily   3. Are you having a reaction (difficulty breathing--STAT)? Diarrhea   4. What is your medication issue? Pt wanted to know how long Dr. Servando Salina wanted to be on this medication. The pt has had diarrhea, which is a side effect. She got some medication from her PCP for the sx.  Please advise

## 2020-08-06 NOTE — Telephone Encounter (Signed)
Called to discuss with Miss Setter.  We discussed that the recommendation for colchicine for 6 months for resolution of pericarditis which would be until 12/2020.   Her husband went to see Dr. Tomasa Blase and mentioned her diarrhea. He prescribed her Hycoscyamine Sulfate 0.125mg  every 4-6 hours as needed which was started on 07/21/20.  Tells me that so long as she takes the Hyoscamine every 6 hours she does not have diarrhea. She did not take it yesterday and had diarrhea.   Reports no chest pain, pressure, tightness. In regards to her breathing, tells me one morning she woke up with a "bit of twisting" but this self resolves. She has been working with PT at home and pleased with her progress. She completed 6 minute walk test the other day. Her BP when checked yesterday was 107/63 no lightheadedness, dizziness, near syncope, syncope.   Overall she was really curious as to how long she would be taking the Colchicine. She was appreciative of the call back and information.   I told her we would have Dr. Servando Salina review and if any changes were recommended we would let her know. I encouraged her to continue using her Hycoscyamine.   Alver Sorrow, NP

## 2020-08-06 NOTE — Telephone Encounter (Signed)
Patient is following up. Please advise. 

## 2020-08-06 NOTE — Telephone Encounter (Signed)
Discussed Dr. Mallory Shirk recommendations with Miss Janee Morn. She was agreeable to continue Colchicine as prescribed and using Hyoscyamine PRN. She has appointment with Dr. Servando Salina 09/17/20 and will keep this appointment.   Alver Sorrow, NP

## 2020-08-06 NOTE — Telephone Encounter (Signed)
Please her know that Ideally she should be on her colchicine for 3 months however in select clinical cases colchicine can be recommended up to 6 months which was in her situation. For now please have her continue the colchicine I can see her at the 3 month mark and we can evaluate the need to stop or continue up to 6 months.  I agree with her continuing to use her Hyoscyamine.

## 2020-08-11 DIAGNOSIS — L299 Pruritus, unspecified: Secondary | ICD-10-CM | POA: Diagnosis not present

## 2020-08-11 DIAGNOSIS — B029 Zoster without complications: Secondary | ICD-10-CM | POA: Diagnosis not present

## 2020-08-12 DIAGNOSIS — I5031 Acute diastolic (congestive) heart failure: Secondary | ICD-10-CM | POA: Diagnosis not present

## 2020-08-12 DIAGNOSIS — B3321 Viral endocarditis: Secondary | ICD-10-CM | POA: Diagnosis not present

## 2020-08-12 DIAGNOSIS — N12 Tubulo-interstitial nephritis, not specified as acute or chronic: Secondary | ICD-10-CM | POA: Diagnosis not present

## 2020-08-12 DIAGNOSIS — I318 Other specified diseases of pericardium: Secondary | ICD-10-CM | POA: Diagnosis not present

## 2020-08-12 DIAGNOSIS — I4891 Unspecified atrial fibrillation: Secondary | ICD-10-CM | POA: Diagnosis not present

## 2020-08-12 DIAGNOSIS — I110 Hypertensive heart disease with heart failure: Secondary | ICD-10-CM | POA: Diagnosis not present

## 2020-08-12 DIAGNOSIS — G0490 Encephalitis and encephalomyelitis, unspecified: Secondary | ICD-10-CM | POA: Diagnosis not present

## 2020-08-12 DIAGNOSIS — B0222 Postherpetic trigeminal neuralgia: Secondary | ICD-10-CM | POA: Diagnosis not present

## 2020-08-12 DIAGNOSIS — G5600 Carpal tunnel syndrome, unspecified upper limb: Secondary | ICD-10-CM | POA: Diagnosis not present

## 2020-08-13 DIAGNOSIS — G5601 Carpal tunnel syndrome, right upper limb: Secondary | ICD-10-CM | POA: Diagnosis not present

## 2020-08-13 DIAGNOSIS — G5621 Lesion of ulnar nerve, right upper limb: Secondary | ICD-10-CM | POA: Diagnosis not present

## 2020-08-13 DIAGNOSIS — M5412 Radiculopathy, cervical region: Secondary | ICD-10-CM

## 2020-08-13 DIAGNOSIS — Z8619 Personal history of other infectious and parasitic diseases: Secondary | ICD-10-CM | POA: Diagnosis not present

## 2020-08-13 HISTORY — DX: Radiculopathy, cervical region: M54.12

## 2020-08-13 HISTORY — DX: Carpal tunnel syndrome, right upper limb: G56.01

## 2020-08-13 HISTORY — DX: Lesion of ulnar nerve, right upper limb: G56.21

## 2020-08-17 DIAGNOSIS — I480 Paroxysmal atrial fibrillation: Secondary | ICD-10-CM | POA: Diagnosis not present

## 2020-08-19 DIAGNOSIS — I110 Hypertensive heart disease with heart failure: Secondary | ICD-10-CM | POA: Diagnosis not present

## 2020-08-19 DIAGNOSIS — B0222 Postherpetic trigeminal neuralgia: Secondary | ICD-10-CM | POA: Diagnosis not present

## 2020-08-19 DIAGNOSIS — G5600 Carpal tunnel syndrome, unspecified upper limb: Secondary | ICD-10-CM | POA: Diagnosis not present

## 2020-08-19 DIAGNOSIS — B3321 Viral endocarditis: Secondary | ICD-10-CM | POA: Diagnosis not present

## 2020-08-19 DIAGNOSIS — N12 Tubulo-interstitial nephritis, not specified as acute or chronic: Secondary | ICD-10-CM | POA: Diagnosis not present

## 2020-08-19 DIAGNOSIS — I318 Other specified diseases of pericardium: Secondary | ICD-10-CM | POA: Diagnosis not present

## 2020-08-19 DIAGNOSIS — I4891 Unspecified atrial fibrillation: Secondary | ICD-10-CM | POA: Diagnosis not present

## 2020-08-19 DIAGNOSIS — G0490 Encephalitis and encephalomyelitis, unspecified: Secondary | ICD-10-CM | POA: Diagnosis not present

## 2020-08-19 DIAGNOSIS — I5031 Acute diastolic (congestive) heart failure: Secondary | ICD-10-CM | POA: Diagnosis not present

## 2020-08-25 ENCOUNTER — Telehealth: Payer: Self-pay

## 2020-08-25 DIAGNOSIS — H43813 Vitreous degeneration, bilateral: Secondary | ICD-10-CM | POA: Diagnosis not present

## 2020-08-25 NOTE — Telephone Encounter (Signed)
-----   Message from Thomasene Ripple, DO sent at 08/25/2020  8:45 AM EST ----- Your monitor shows about 6 episodes of paroxysmal SVT with the highest heart rate going up to 120.  No atrial fibrillation.

## 2020-08-25 NOTE — Telephone Encounter (Signed)
Spoke with patient regarding results and recommendation.  Patient verbalizes understanding and is agreeable to plan of care. Advised patient to call back with any issues or concerns.  

## 2020-08-26 DIAGNOSIS — I110 Hypertensive heart disease with heart failure: Secondary | ICD-10-CM | POA: Diagnosis not present

## 2020-08-26 DIAGNOSIS — I318 Other specified diseases of pericardium: Secondary | ICD-10-CM | POA: Diagnosis not present

## 2020-08-26 DIAGNOSIS — G5600 Carpal tunnel syndrome, unspecified upper limb: Secondary | ICD-10-CM | POA: Diagnosis not present

## 2020-08-26 DIAGNOSIS — B3321 Viral endocarditis: Secondary | ICD-10-CM | POA: Diagnosis not present

## 2020-08-26 DIAGNOSIS — B0222 Postherpetic trigeminal neuralgia: Secondary | ICD-10-CM | POA: Diagnosis not present

## 2020-08-26 DIAGNOSIS — I4891 Unspecified atrial fibrillation: Secondary | ICD-10-CM | POA: Diagnosis not present

## 2020-08-26 DIAGNOSIS — N12 Tubulo-interstitial nephritis, not specified as acute or chronic: Secondary | ICD-10-CM | POA: Diagnosis not present

## 2020-08-26 DIAGNOSIS — G0490 Encephalitis and encephalomyelitis, unspecified: Secondary | ICD-10-CM | POA: Diagnosis not present

## 2020-08-26 DIAGNOSIS — I5031 Acute diastolic (congestive) heart failure: Secondary | ICD-10-CM | POA: Diagnosis not present

## 2020-09-01 DIAGNOSIS — I4891 Unspecified atrial fibrillation: Secondary | ICD-10-CM | POA: Diagnosis not present

## 2020-09-01 DIAGNOSIS — N12 Tubulo-interstitial nephritis, not specified as acute or chronic: Secondary | ICD-10-CM | POA: Diagnosis not present

## 2020-09-01 DIAGNOSIS — I5031 Acute diastolic (congestive) heart failure: Secondary | ICD-10-CM | POA: Diagnosis not present

## 2020-09-01 DIAGNOSIS — I110 Hypertensive heart disease with heart failure: Secondary | ICD-10-CM | POA: Diagnosis not present

## 2020-09-01 DIAGNOSIS — I318 Other specified diseases of pericardium: Secondary | ICD-10-CM | POA: Diagnosis not present

## 2020-09-01 DIAGNOSIS — G0490 Encephalitis and encephalomyelitis, unspecified: Secondary | ICD-10-CM | POA: Diagnosis not present

## 2020-09-01 DIAGNOSIS — B0222 Postherpetic trigeminal neuralgia: Secondary | ICD-10-CM | POA: Diagnosis not present

## 2020-09-01 DIAGNOSIS — B3321 Viral endocarditis: Secondary | ICD-10-CM | POA: Diagnosis not present

## 2020-09-01 DIAGNOSIS — G5600 Carpal tunnel syndrome, unspecified upper limb: Secondary | ICD-10-CM | POA: Diagnosis not present

## 2020-09-02 DIAGNOSIS — M21372 Foot drop, left foot: Secondary | ICD-10-CM | POA: Diagnosis not present

## 2020-09-02 DIAGNOSIS — I5031 Acute diastolic (congestive) heart failure: Secondary | ICD-10-CM | POA: Diagnosis not present

## 2020-09-02 DIAGNOSIS — B0222 Postherpetic trigeminal neuralgia: Secondary | ICD-10-CM | POA: Diagnosis not present

## 2020-09-02 DIAGNOSIS — I318 Other specified diseases of pericardium: Secondary | ICD-10-CM | POA: Diagnosis not present

## 2020-09-02 DIAGNOSIS — B3321 Viral endocarditis: Secondary | ICD-10-CM | POA: Diagnosis not present

## 2020-09-02 DIAGNOSIS — N12 Tubulo-interstitial nephritis, not specified as acute or chronic: Secondary | ICD-10-CM | POA: Diagnosis not present

## 2020-09-02 DIAGNOSIS — G0490 Encephalitis and encephalomyelitis, unspecified: Secondary | ICD-10-CM | POA: Diagnosis not present

## 2020-09-02 DIAGNOSIS — I110 Hypertensive heart disease with heart failure: Secondary | ICD-10-CM | POA: Diagnosis not present

## 2020-09-02 DIAGNOSIS — I4891 Unspecified atrial fibrillation: Secondary | ICD-10-CM | POA: Diagnosis not present

## 2020-09-02 DIAGNOSIS — G5600 Carpal tunnel syndrome, unspecified upper limb: Secondary | ICD-10-CM | POA: Diagnosis not present

## 2020-09-08 DIAGNOSIS — N12 Tubulo-interstitial nephritis, not specified as acute or chronic: Secondary | ICD-10-CM | POA: Diagnosis not present

## 2020-09-08 DIAGNOSIS — G0490 Encephalitis and encephalomyelitis, unspecified: Secondary | ICD-10-CM | POA: Diagnosis not present

## 2020-09-08 DIAGNOSIS — B0222 Postherpetic trigeminal neuralgia: Secondary | ICD-10-CM | POA: Diagnosis not present

## 2020-09-08 DIAGNOSIS — I5031 Acute diastolic (congestive) heart failure: Secondary | ICD-10-CM | POA: Diagnosis not present

## 2020-09-08 DIAGNOSIS — I110 Hypertensive heart disease with heart failure: Secondary | ICD-10-CM | POA: Diagnosis not present

## 2020-09-08 DIAGNOSIS — B3321 Viral endocarditis: Secondary | ICD-10-CM | POA: Diagnosis not present

## 2020-09-08 DIAGNOSIS — I318 Other specified diseases of pericardium: Secondary | ICD-10-CM | POA: Diagnosis not present

## 2020-09-08 DIAGNOSIS — I4891 Unspecified atrial fibrillation: Secondary | ICD-10-CM | POA: Diagnosis not present

## 2020-09-08 DIAGNOSIS — G5600 Carpal tunnel syndrome, unspecified upper limb: Secondary | ICD-10-CM | POA: Diagnosis not present

## 2020-09-11 DIAGNOSIS — L570 Actinic keratosis: Secondary | ICD-10-CM | POA: Diagnosis not present

## 2020-09-11 DIAGNOSIS — D1801 Hemangioma of skin and subcutaneous tissue: Secondary | ICD-10-CM | POA: Diagnosis not present

## 2020-09-11 DIAGNOSIS — L821 Other seborrheic keratosis: Secondary | ICD-10-CM | POA: Diagnosis not present

## 2020-09-15 DIAGNOSIS — B0222 Postherpetic trigeminal neuralgia: Secondary | ICD-10-CM | POA: Diagnosis not present

## 2020-09-15 DIAGNOSIS — N12 Tubulo-interstitial nephritis, not specified as acute or chronic: Secondary | ICD-10-CM | POA: Diagnosis not present

## 2020-09-15 DIAGNOSIS — G5600 Carpal tunnel syndrome, unspecified upper limb: Secondary | ICD-10-CM | POA: Diagnosis not present

## 2020-09-15 DIAGNOSIS — G0490 Encephalitis and encephalomyelitis, unspecified: Secondary | ICD-10-CM | POA: Diagnosis not present

## 2020-09-15 DIAGNOSIS — I318 Other specified diseases of pericardium: Secondary | ICD-10-CM | POA: Diagnosis not present

## 2020-09-15 DIAGNOSIS — B3321 Viral endocarditis: Secondary | ICD-10-CM | POA: Diagnosis not present

## 2020-09-15 DIAGNOSIS — I110 Hypertensive heart disease with heart failure: Secondary | ICD-10-CM | POA: Diagnosis not present

## 2020-09-15 DIAGNOSIS — I5031 Acute diastolic (congestive) heart failure: Secondary | ICD-10-CM | POA: Diagnosis not present

## 2020-09-15 DIAGNOSIS — I4891 Unspecified atrial fibrillation: Secondary | ICD-10-CM | POA: Diagnosis not present

## 2020-09-16 DIAGNOSIS — R29810 Facial weakness: Secondary | ICD-10-CM | POA: Insufficient documentation

## 2020-09-16 DIAGNOSIS — I10 Essential (primary) hypertension: Secondary | ICD-10-CM | POA: Insufficient documentation

## 2020-09-17 ENCOUNTER — Encounter: Payer: Self-pay | Admitting: Cardiology

## 2020-09-17 ENCOUNTER — Ambulatory Visit: Payer: Medicare PPO | Admitting: Cardiology

## 2020-09-17 ENCOUNTER — Other Ambulatory Visit: Payer: Self-pay

## 2020-09-17 ENCOUNTER — Telehealth: Payer: Self-pay | Admitting: Cardiology

## 2020-09-17 VITALS — BP 140/62 | HR 80 | Ht 66.0 in | Wt 161.6 lb

## 2020-09-17 DIAGNOSIS — I480 Paroxysmal atrial fibrillation: Secondary | ICD-10-CM

## 2020-09-17 DIAGNOSIS — I309 Acute pericarditis, unspecified: Secondary | ICD-10-CM

## 2020-09-17 DIAGNOSIS — I4891 Unspecified atrial fibrillation: Secondary | ICD-10-CM | POA: Diagnosis not present

## 2020-09-17 DIAGNOSIS — I10 Essential (primary) hypertension: Secondary | ICD-10-CM

## 2020-09-17 DIAGNOSIS — I301 Infective pericarditis: Secondary | ICD-10-CM

## 2020-09-17 DIAGNOSIS — I5032 Chronic diastolic (congestive) heart failure: Secondary | ICD-10-CM | POA: Diagnosis not present

## 2020-09-17 MED ORDER — HYDROCHLOROTHIAZIDE 12.5 MG PO CAPS: 12.5000 mg | ORAL_CAPSULE | Freq: Every day | ORAL | 3 refills | Status: DC

## 2020-09-17 NOTE — Telephone Encounter (Signed)
° ° °  Pt c/o medication issue:  1. Name of Medication:   hydrochlorothiazide (MICROZIDE) 12.5 MG capsule    2. How are you currently taking this medication (dosage and times per day)? Take 1 capsule (12.5 mg total) by mouth daily.  3. Are you having a reaction (difficulty breathing--STAT)?   4. What is your medication issue? Pt said she mistakenly told Dr. Servando Salina she takes 25 mg of this med when she got home she look at her bottle and she actually taking 5 mg of hydrochlorothiazide

## 2020-09-17 NOTE — Telephone Encounter (Signed)
There may be some misunderstanding with the medication doses, hydrochlorothiazide lowest dose is usually 12.5 mg daily.  Please advise patient for her pharmacy information and double check with the pharmacy please.

## 2020-09-17 NOTE — Patient Instructions (Signed)
Medication Instructions:  Your physician has recommended you make the following change in your medication:   START: Hydrochlorothiazide 12.5mg  daily (START 09/19/20)  STOP: 09/19/20 stop Colchicine  *If you need a refill on your cardiac medications before your next appointment, please call your pharmacy*   Lab Work: Your physician recommends that you return for lab work in:   BMET, MG, CBC If you have labs (blood work) drawn today and your tests are completely normal, you will receive your results only by: Marland Kitchen MyChart Message (if you have MyChart) OR . A paper copy in the mail If you have any lab test that is abnormal or we need to change your treatment, we will call you to review the results.   Testing/Procedures: NONE   Follow-Up: At [facility], you and your health needs are our priority.  As part of our continuing mission to provide you with exceptional heart care, we have created designated Provider Care Teams.  These Care Teams include your primary Cardiologist (physician) and Advanced Practice Providers (APPs -  Physician Assistants and Nurse Practitioners) who all work together to provide you with the care you need, when you need it.  We recommend signing up for the patient portal called "MyChart".  Sign up information is provided on this After Visit Summary.  MyChart is used to connect with patients for Virtual Visits (Telemedicine).  Patients are able to view lab/test results, encounter notes, upcoming appointments, etc.  Non-urgent messages can be sent to your provider as well.   To learn more about what you can do with MyChart, go to [email or web address].    Your next appointment:   8 week(s)  The format for your next appointment:   In Person  Provider:   Thomasene Ripple, DO   Other Instructions

## 2020-09-17 NOTE — Progress Notes (Signed)
Cardiology Office Note:    Date:  09/17/2020   ID:  Alyssa Hudson, DOB Jan 25, 1941, MRN [id number]  PCP:  Paulina FusiSchultz, Douglas E, MD  Cardiologist:  Thomasene RippleKardie Briston Lax, DO  Electrophysiologist:  None   Referring MD: Paulina FusiSchultz, Douglas E, MD  " I am having so much diarrhea I cannot take it anymore"   History of Present Illness:    Alyssa Hudson is a 79 y.o. female with a hx of diastolic heart failure, recently diagnosed large pericarditis with pericardial fusion currently being treated with colchicine planned for a total of 6 months, recent history of COVID-19 infection, hypertension, paroxysmal atrial fibrillation during her hospitalization presents today for posthospitalization follow-up.  I saw the patient on July 23, 2020 at that time she was post [facility] from shingles flare infection where she also did suffer encephalitis.  Of note, During er hospitalization she was suspected to have acute encephalopath in the setting of her shingle infection. His CT scan  Of the head without contrast showed small vessel disease and an equivocal asymmetric increased density of the Right MCA, without acute disease process. MRI brain without contrast showed chronic lacunar infarcts vs. perivascular spaces in the bilateral BG and chronic lacunar infarct of the right caudate with tiny infarcts of bilateral cerebellar hemispheres, but no acute intracranial abnormality. EEG showed cortical dysfunction of the R temporal region and moderate diffuse encephalopathy possibly consistent with post-ictal state.CSF studies were suggestive of a viral etiology.  During her visit with me we discussed the duration of her colchicine which was initially planned for April 2022 for 6 months.  In addition we talked about the atrial fibrillation and the patient prefer to hold off on any chronic anticoagulation at that time. In the interim the patient called complaining that she had had some diarrhea and her  PCP was helping her with medication to control the diarrhea. She is here today with her husband the diarrhea has gotten worse and they are both concerned.  She has lost tremendous amount of weight and it seems based on records September 02, 2020 she was 196 pounds today she is 161 pounds.  She is very worried about this.  The most bothersome thing is the diarrhea she is experiencing.  Past Medical History:  Diagnosis Date  . Acute diastolic heart failure (HCC) 06/30/2020  . Aftercare following surgery 06/09/2016  . Encephalopathy 06/19/2020  . Facial droop   . Hypertension   . Hyponatremia 06/20/2020  . Viral pericarditis with pericardial effusion 06/30/2020    Past Surgical History:  Procedure Laterality Date  . IR FLUORO GUIDED NEEDLE PLC ASPIRATION/INJECTION LOC  06/23/2020  . RADIOLOGY WITH ANESTHESIA N/A 06/23/2020   Procedure: IR WITH ANESTHESIA;  Surgeon: Radiologist, Medication, MD;  Location: [facility] OR;  Service: Radiology;  Laterality: N/A;    Current Medications: Current Meds  Medication Sig  . acetaminophen (TYLENOL) 500 MG tablet Take 500 mg by mouth every 6 (six) hours as needed for headache.  Marland Kitchen. amLODipine (NORVASC) 10 MG tablet Take 1 tablet (10 mg total) by mouth daily.  . Biotin 5000 MCG TABS Take 5,000 mcg by mouth daily.  Marland Kitchen. CRANBERRY PO Take 1 tablet by mouth daily.  . furosemide (LASIX) 40 MG tablet Take 1 tablet (40 mg total) by mouth daily.  Marland Kitchen. gabapentin (NEURONTIN) 300 MG capsule Take 300 mg by mouth 3 (three) times daily.   Marland Kitchen. HYOSCYAMINE SULFATE PO Take 0.125 mg by mouth. Take one tablet every 4-6 hours as needed  for diarrhea.  Marland Kitchen ibuprofen (ADVIL) 600 MG tablet Take 1 tablet (600 mg total) by mouth 3 (three) times daily.  Marland Kitchen LIDOCAINE EX Apply 1 application topically 3 (three) times daily as needed (shingles pain). cream  . LIDOCAINE PAIN RELIEF EX Place 1 patch onto the skin daily as needed (shingles pain).  Marland Kitchen losartan (COZAAR) 100 MG tablet Take 100 mg by mouth daily.   . metoprolol succinate (TOPROL-XL) 100 MG 24 hr tablet Take 100 mg by mouth daily.  . Probiotic Product (PROBIOTIC PO) Take 1 tablet by mouth daily.  . [DISCONTINUED] colchicine 0.6 MG tablet Take 1 tablet (0.6 mg total) by mouth 2 (two) times daily.     Allergies:   Codeine, Contrast media [iodinated diagnostic agents], Iodine-131, and Sulfa antibiotics   Social History   Socioeconomic History  . Marital status: Married    Spouse name: Not on file  . Number of children: Not on file  . Years of education: Not on file  . Highest education level: Not on file  Occupational History  . Not on file  Tobacco Use  . Smoking status: Former Games developer  . Smokeless tobacco: Never Used  Substance and Sexual Activity  . Alcohol use: Not on file  . Drug use: Not on file  . Sexual activity: Not on file  Other Topics Concern  . Not on file  Social History Narrative  . Not on file   Social Determinants of Health   Financial Resource Strain: Not on file  Food Insecurity: Not on file  Transportation Needs: Not on file  Physical Activity: Not on file  Stress: Not on file  Social Connections: Not on file     Family History: The patient's family history includes Alzheimer's disease (age of onset: 3) in her sister; Muscular dystrophy in her brother; Prostate cancer (age of onset: 21) in her father.  ROS:   Review of Systems  Constitution: Negative for decreased appetite, fever and weight gain.  HENT: Negative for congestion, ear discharge, hoarse voice and sore throat.   Eyes: Negative for discharge, redness, vision loss in right eye and visual halos.  Cardiovascular: Negative for chest pain, dyspnea on exertion, leg swelling, orthopnea and palpitations.  Respiratory: Negative for cough, hemoptysis, shortness of breath and snoring.   Endocrine: Negative for heat intolerance and polyphagia.  Hematologic/Lymphatic: Negative for bleeding problem. Does not bruise/bleed easily.  Skin: Negative  for flushing, nail changes, rash and suspicious lesions.  Musculoskeletal: Negative for arthritis, joint pain, muscle cramps, myalgias, neck pain and stiffness.  Gastrointestinal: Negative for abdominal pain, bowel incontinence, diarrhea and excessive appetite.  Genitourinary: Negative for decreased libido, genital sores and incomplete emptying.  Neurological: Negative for brief paralysis, focal weakness, headaches and loss of balance.  Psychiatric/Behavioral: Negative for altered mental status, depression and suicidal ideas.  Allergic/Immunologic: Negative for HIV exposure and persistent infections.    EKGs/Labs/Other Studies Reviewed:    The following studies were reviewed today:   EKG: None today  Recent Labs: 06/29/2020: ALT 13; B Natriuretic Peptide 165.4; Hemoglobin 10.3; Platelets 235; TSH 4.215 07/23/2020: BUN 11; Creatinine, Ser 0.82; Magnesium 1.9; Potassium 4.2; Sodium 141  Recent Lipid Panel No results found for: CHOL, TRIG, HDL, CHOLHDL, VLDL, LDLCALC, LDLDIRECT  Physical Exam:    VS:  BP 140/62   Pulse 80   Ht 5\' 6"  (1.676 m)   Wt 161 lb 9.6 oz (73.3 kg)   SpO2 97%   BMI 26.08 kg/m     Wt Readings  from Last 3 Encounters:  09/17/20 161 lb 9.6 oz (73.3 kg)  07/23/20 181 lb 9.6 oz (82.4 kg)  07/01/20 167 lb (75.8 kg)     GEN: Well nourished, well developed in no acute distress HEENT: Normal NECK: No JVD; No carotid bruits LYMPHATICS: No lymphadenopathy CARDIAC: S1S2 noted,RRR, no murmurs, rubs, gallops RESPIRATORY:  Clear to auscultation without rales, wheezing or rhonchi  ABDOMEN: Soft, non-tender, non-distended, +bowel sounds, no guarding. EXTREMITIES: No edema, No cyanosis, no clubbing MUSCULOSKELETAL:  No deformity  SKIN: Warm and dry NEUROLOGIC:  Alert and oriented x 3, non-focal PSYCHIATRIC:  Normal affect, good insight  ASSESSMENT:    1. Acute pericarditis, unspecified type   2. Chronic diastolic heart failure (HCC)   3. Paroxysmal atrial  fibrillation (HCC)   4. Primary hypertension   5. Viral pericarditis with pericardial effusion    PLAN:    I am going to stop the colchicine after December 25 which would be a good 30-day course of this medication.  The patient cannot tolerate going up to April 2022 as she tells me that her whole lifestyle has been significantly interrupted by the diarrhea.  We will monitor her clinically off of the colchicine and if she persists with any symptoms for endocarditis will likely use steroids at that time instead of colchicine given the significant side effect.  If diarrhea does not improve and with her significant weight loss she would also need to see gastroenterologist.  She tells me that her blood pressure has been dropping at home where she has been in the 70s when she stands up.  She is not positive orthostatic here.  But I am concerned for these low blood pressures at home and the dizziness so I am doing a cut back on her hydrochlorothiazide to 12.5 mg daily.  She will continue to monitor this as well.  Paroxysmal atrial afibrillation we review her monitor results did not show any A. fib for now the patient would like to hold off on any anticoagulation at this time.  Blood work will be done today to assess BMP, mag and CBC.  The patient is in agreement with the above plan. The patient left the office in stable condition.  The patient will follow up in 8 weeks or sooner if needed.   Medication Adjustments/Labs and Tests Ordered: Current medicines are reviewed at length with the patient today.  Concerns regarding medicines are outlined above.  Orders Placed This Encounter  Procedures  . Basic Metabolic Panel (BMET)  . Magnesium  . CBC   Meds ordered this encounter  Medications  . hydrochlorothiazide (MICROZIDE) 12.5 MG capsule    Sig: Take 1 capsule (12.5 mg total) by mouth daily.    Dispense:  90 capsule    Refill:  3    Patient Instructions  Medication Instructions:  Your  physician has recommended you make the following change in your medication:   START: Hydrochlorothiazide 12.5mg  daily (START 09/19/20)  STOP: 09/19/20 stop Colchicine  *If you need a refill on your cardiac medications before your next appointment, please call your pharmacy*   Lab Work: Your physician recommends that you return for lab work in:   BMET, MG, CBC If you have labs (blood work) drawn today and your tests are completely normal, you will receive your results only by: Marland Kitchen MyChart Message (if you have MyChart) OR . A paper copy in the mail If you have any lab test that is abnormal or we need to change your  treatment, we will call you to review the results.   Testing/Procedures: NONE   Follow-Up: At [facility], you and your health needs are our priority.  As part of our continuing mission to provide you with exceptional heart care, we have created designated Provider Care Teams.  These Care Teams include your primary Cardiologist (physician) and Advanced Practice Providers (APPs -  Physician Assistants and Nurse Practitioners) who all work together to provide you with the care you need, when you need it.  We recommend signing up for the patient portal called "MyChart".  Sign up information is provided on this After Visit Summary.  MyChart is used to connect with patients for Virtual Visits (Telemedicine).  Patients are able to view lab/test results, encounter notes, upcoming appointments, etc.  Non-urgent messages can be sent to your provider as well.   To learn more about what you can do with MyChart, go to [email or web address].    Your next appointment:   8 week(s)  The format for your next appointment:   In Person  Provider:   Thomasene Ripple, DO   Other Instructions      Adopting a Healthy Lifestyle.  Know what a healthy weight is for you (roughly BMI <25) and aim to maintain this   Aim for 7+ servings of fruits and vegetables daily   65-80+ fluid ounces of  water or unsweet tea for healthy kidneys   Limit to max 1 drink of alcohol per day; avoid smoking/tobacco   Limit animal fats in diet for cholesterol and heart health - choose grass fed whenever available   Avoid highly processed foods, and foods high in saturated/trans fats   Aim for low stress - take time to unwind and care for your mental health   Aim for 150 min of moderate intensity exercise weekly for heart health, and weights twice weekly for bone health   Aim for 7-9 hours of sleep daily   When it comes to diets, agreement about the perfect plan isnt easy to find, even among the experts. Experts at the [facility] [employer] developed an idea known as the Healthy Eating Plate. Just imagine a plate divided into logical, healthy portions.   The emphasis is on diet quality:   Load up on vegetables and fruits - one-half of your plate: Aim for color and variety, and remember that potatoes dont count.   Go for whole grains - one-quarter of your plate: Whole wheat, barley, wheat berries, quinoa, oats, brown rice, and foods made with them. If you want pasta, go with whole wheat pasta.   Protein power - one-quarter of your plate: Fish, chicken, beans, and nuts are all healthy, versatile protein sources. Limit red meat.   The diet, however, does go beyond the plate, offering a few other suggestions.   Use healthy plant oils, such as olive, canola, soy, corn, sunflower and peanut. Check the labels, and avoid partially hydrogenated oil, which have unhealthy trans fats.   If youre thirsty, drink water. Coffee and tea are good in moderation, but skip sugary drinks and limit milk and dairy products to one or two daily servings.   The type of carbohydrate in the diet is more important than the amount. Some sources of carbohydrates, such as vegetables, fruits, whole grains, and beans-are healthier than others.   Finally, stay active  Signed, Thomasene Ripple, DO  09/17/2020 12:08  PM    [facility]

## 2020-09-17 NOTE — Telephone Encounter (Signed)
Called patient. She got confused she was taking 25 mg daily. She apologizes for confusion. She will start the 12.5 mg daily dose now since Dr. Servando Salina wanted to decrease her at her appointment to day. She verbally understood. No further questions.

## 2020-09-18 LAB — BASIC METABOLIC PANEL
BUN/Creatinine Ratio: 28 (ref 12–28)
BUN: 42 mg/dL — ABNORMAL HIGH (ref 8–27)
CO2: 29 mmol/L (ref 20–29)
Calcium: 9.8 mg/dL (ref 8.7–10.3)
Chloride: 91 mmol/L — ABNORMAL LOW (ref 96–106)
Creatinine, Ser: 1.49 mg/dL — ABNORMAL HIGH (ref 0.57–1.00)
GFR calc Af Amer: 38 mL/min/{1.73_m2} — ABNORMAL LOW (ref >59)
GFR calc non Af Amer: 33 mL/min/{1.73_m2} — ABNORMAL LOW (ref >59)
Glucose: 104 mg/dL — ABNORMAL HIGH (ref 65–99)
Potassium: 4.1 mmol/L (ref 3.5–5.2)
Sodium: 137 mmol/L (ref 134–144)

## 2020-09-18 LAB — CBC
Hematocrit: 39.1 % (ref 34.0–46.6)
Hemoglobin: 13 g/dL (ref 11.1–15.9)
MCH: 27.8 pg (ref 26.6–33.0)
MCHC: 33.2 g/dL (ref 31.5–35.7)
MCV: 84 fL (ref 79–97)
Platelets: 241 10*3/uL (ref 150–450)
RBC: 4.67 x10E6/uL (ref 3.77–5.28)
RDW: 18.5 % — ABNORMAL HIGH (ref 11.7–15.4)
WBC: 7.3 10*3/uL (ref 3.4–10.8)

## 2020-09-18 LAB — MAGNESIUM: Magnesium: 2 mg/dL (ref 1.6–2.3)

## 2020-09-21 ENCOUNTER — Telehealth: Payer: Self-pay

## 2020-09-21 NOTE — Telephone Encounter (Signed)
-----   Message from Thomasene Ripple, DO sent at 09/21/2020  9:15 AM EST ----- Your creatinine is elevated please cut back on your Lasix to every other day.  Your hemoglobin is back to normal.

## 2020-09-21 NOTE — Telephone Encounter (Signed)
Spoke with patient regarding results and recommendation.  Patient verbalizes understanding and is agreeable to plan of care. Advised patient to call back with any issues or concerns.  

## 2020-09-22 DIAGNOSIS — M79604 Pain in right leg: Secondary | ICD-10-CM | POA: Diagnosis not present

## 2020-09-22 DIAGNOSIS — M1711 Unilateral primary osteoarthritis, right knee: Secondary | ICD-10-CM | POA: Diagnosis not present

## 2020-09-22 DIAGNOSIS — R7301 Impaired fasting glucose: Secondary | ICD-10-CM | POA: Diagnosis not present

## 2020-09-22 DIAGNOSIS — R634 Abnormal weight loss: Secondary | ICD-10-CM | POA: Diagnosis not present

## 2020-09-22 DIAGNOSIS — M25561 Pain in right knee: Secondary | ICD-10-CM | POA: Diagnosis not present

## 2020-09-28 DIAGNOSIS — K449 Diaphragmatic hernia without obstruction or gangrene: Secondary | ICD-10-CM | POA: Diagnosis not present

## 2020-09-28 DIAGNOSIS — I700 Atherosclerosis of aorta: Secondary | ICD-10-CM | POA: Diagnosis not present

## 2020-09-28 DIAGNOSIS — K429 Umbilical hernia without obstruction or gangrene: Secondary | ICD-10-CM | POA: Diagnosis not present

## 2020-09-28 DIAGNOSIS — I2510 Atherosclerotic heart disease of native coronary artery without angina pectoris: Secondary | ICD-10-CM | POA: Diagnosis not present

## 2020-09-28 DIAGNOSIS — K5730 Diverticulosis of large intestine without perforation or abscess without bleeding: Secondary | ICD-10-CM | POA: Diagnosis not present

## 2020-09-28 DIAGNOSIS — R634 Abnormal weight loss: Secondary | ICD-10-CM | POA: Diagnosis not present

## 2020-09-29 DIAGNOSIS — I5031 Acute diastolic (congestive) heart failure: Secondary | ICD-10-CM | POA: Diagnosis not present

## 2020-09-29 DIAGNOSIS — G0490 Encephalitis and encephalomyelitis, unspecified: Secondary | ICD-10-CM | POA: Diagnosis not present

## 2020-09-29 DIAGNOSIS — B3321 Viral endocarditis: Secondary | ICD-10-CM | POA: Diagnosis not present

## 2020-09-29 DIAGNOSIS — G5600 Carpal tunnel syndrome, unspecified upper limb: Secondary | ICD-10-CM | POA: Diagnosis not present

## 2020-09-29 DIAGNOSIS — G8929 Other chronic pain: Secondary | ICD-10-CM | POA: Diagnosis not present

## 2020-09-29 DIAGNOSIS — I318 Other specified diseases of pericardium: Secondary | ICD-10-CM | POA: Diagnosis not present

## 2020-09-29 DIAGNOSIS — N12 Tubulo-interstitial nephritis, not specified as acute or chronic: Secondary | ICD-10-CM | POA: Diagnosis not present

## 2020-09-29 DIAGNOSIS — M1711 Unilateral primary osteoarthritis, right knee: Secondary | ICD-10-CM | POA: Diagnosis not present

## 2020-09-29 DIAGNOSIS — B0222 Postherpetic trigeminal neuralgia: Secondary | ICD-10-CM | POA: Diagnosis not present

## 2020-09-29 DIAGNOSIS — I110 Hypertensive heart disease with heart failure: Secondary | ICD-10-CM | POA: Diagnosis not present

## 2020-09-29 DIAGNOSIS — M25561 Pain in right knee: Secondary | ICD-10-CM | POA: Diagnosis not present

## 2020-09-29 DIAGNOSIS — I4891 Unspecified atrial fibrillation: Secondary | ICD-10-CM | POA: Diagnosis not present

## 2020-10-01 DIAGNOSIS — G5600 Carpal tunnel syndrome, unspecified upper limb: Secondary | ICD-10-CM | POA: Diagnosis not present

## 2020-10-02 DIAGNOSIS — B0222 Postherpetic trigeminal neuralgia: Secondary | ICD-10-CM | POA: Diagnosis not present

## 2020-10-02 DIAGNOSIS — I110 Hypertensive heart disease with heart failure: Secondary | ICD-10-CM | POA: Diagnosis not present

## 2020-10-02 DIAGNOSIS — I4891 Unspecified atrial fibrillation: Secondary | ICD-10-CM | POA: Diagnosis not present

## 2020-10-02 DIAGNOSIS — B3321 Viral endocarditis: Secondary | ICD-10-CM | POA: Diagnosis not present

## 2020-10-02 DIAGNOSIS — I318 Other specified diseases of pericardium: Secondary | ICD-10-CM | POA: Diagnosis not present

## 2020-10-02 DIAGNOSIS — I5031 Acute diastolic (congestive) heart failure: Secondary | ICD-10-CM | POA: Diagnosis not present

## 2020-10-02 DIAGNOSIS — G0490 Encephalitis and encephalomyelitis, unspecified: Secondary | ICD-10-CM | POA: Diagnosis not present

## 2020-10-02 DIAGNOSIS — N12 Tubulo-interstitial nephritis, not specified as acute or chronic: Secondary | ICD-10-CM | POA: Diagnosis not present

## 2020-10-02 DIAGNOSIS — G5600 Carpal tunnel syndrome, unspecified upper limb: Secondary | ICD-10-CM | POA: Diagnosis not present

## 2020-10-06 DIAGNOSIS — Z1159 Encounter for screening for other viral diseases: Secondary | ICD-10-CM | POA: Diagnosis not present

## 2020-10-06 DIAGNOSIS — Z1152 Encounter for screening for COVID-19: Secondary | ICD-10-CM | POA: Diagnosis not present

## 2020-10-07 DIAGNOSIS — G5600 Carpal tunnel syndrome, unspecified upper limb: Secondary | ICD-10-CM | POA: Diagnosis not present

## 2020-10-07 DIAGNOSIS — B0222 Postherpetic trigeminal neuralgia: Secondary | ICD-10-CM | POA: Diagnosis not present

## 2020-10-07 DIAGNOSIS — B3321 Viral endocarditis: Secondary | ICD-10-CM | POA: Diagnosis not present

## 2020-10-07 DIAGNOSIS — I318 Other specified diseases of pericardium: Secondary | ICD-10-CM | POA: Diagnosis not present

## 2020-10-07 DIAGNOSIS — I110 Hypertensive heart disease with heart failure: Secondary | ICD-10-CM | POA: Diagnosis not present

## 2020-10-07 DIAGNOSIS — N12 Tubulo-interstitial nephritis, not specified as acute or chronic: Secondary | ICD-10-CM | POA: Diagnosis not present

## 2020-10-07 DIAGNOSIS — I4891 Unspecified atrial fibrillation: Secondary | ICD-10-CM | POA: Diagnosis not present

## 2020-10-07 DIAGNOSIS — G0490 Encephalitis and encephalomyelitis, unspecified: Secondary | ICD-10-CM | POA: Diagnosis not present

## 2020-10-07 DIAGNOSIS — I5031 Acute diastolic (congestive) heart failure: Secondary | ICD-10-CM | POA: Diagnosis not present

## 2020-10-15 DIAGNOSIS — B0222 Postherpetic trigeminal neuralgia: Secondary | ICD-10-CM | POA: Diagnosis not present

## 2020-10-15 DIAGNOSIS — I4891 Unspecified atrial fibrillation: Secondary | ICD-10-CM | POA: Diagnosis not present

## 2020-10-15 DIAGNOSIS — G0490 Encephalitis and encephalomyelitis, unspecified: Secondary | ICD-10-CM | POA: Diagnosis not present

## 2020-10-15 DIAGNOSIS — B3321 Viral endocarditis: Secondary | ICD-10-CM | POA: Diagnosis not present

## 2020-10-15 DIAGNOSIS — I318 Other specified diseases of pericardium: Secondary | ICD-10-CM | POA: Diagnosis not present

## 2020-10-15 DIAGNOSIS — I110 Hypertensive heart disease with heart failure: Secondary | ICD-10-CM | POA: Diagnosis not present

## 2020-10-15 DIAGNOSIS — I5031 Acute diastolic (congestive) heart failure: Secondary | ICD-10-CM | POA: Diagnosis not present

## 2020-10-15 DIAGNOSIS — N12 Tubulo-interstitial nephritis, not specified as acute or chronic: Secondary | ICD-10-CM | POA: Diagnosis not present

## 2020-10-15 DIAGNOSIS — G5600 Carpal tunnel syndrome, unspecified upper limb: Secondary | ICD-10-CM | POA: Diagnosis not present

## 2020-10-19 DIAGNOSIS — I318 Other specified diseases of pericardium: Secondary | ICD-10-CM | POA: Diagnosis not present

## 2020-10-19 DIAGNOSIS — I110 Hypertensive heart disease with heart failure: Secondary | ICD-10-CM | POA: Diagnosis not present

## 2020-10-19 DIAGNOSIS — I4891 Unspecified atrial fibrillation: Secondary | ICD-10-CM | POA: Diagnosis not present

## 2020-10-19 DIAGNOSIS — G0490 Encephalitis and encephalomyelitis, unspecified: Secondary | ICD-10-CM | POA: Diagnosis not present

## 2020-10-19 DIAGNOSIS — B0222 Postherpetic trigeminal neuralgia: Secondary | ICD-10-CM | POA: Diagnosis not present

## 2020-10-19 DIAGNOSIS — N12 Tubulo-interstitial nephritis, not specified as acute or chronic: Secondary | ICD-10-CM | POA: Diagnosis not present

## 2020-10-19 DIAGNOSIS — I5031 Acute diastolic (congestive) heart failure: Secondary | ICD-10-CM | POA: Diagnosis not present

## 2020-10-19 DIAGNOSIS — G5600 Carpal tunnel syndrome, unspecified upper limb: Secondary | ICD-10-CM | POA: Diagnosis not present

## 2020-10-19 DIAGNOSIS — B3321 Viral endocarditis: Secondary | ICD-10-CM | POA: Diagnosis not present

## 2020-10-20 DIAGNOSIS — Z1159 Encounter for screening for other viral diseases: Secondary | ICD-10-CM | POA: Diagnosis not present

## 2020-10-20 DIAGNOSIS — Z1152 Encounter for screening for COVID-19: Secondary | ICD-10-CM | POA: Diagnosis not present

## 2020-10-26 DIAGNOSIS — G5601 Carpal tunnel syndrome, right upper limb: Secondary | ICD-10-CM | POA: Diagnosis not present

## 2020-10-26 DIAGNOSIS — Z79899 Other long term (current) drug therapy: Secondary | ICD-10-CM | POA: Diagnosis not present

## 2020-10-26 DIAGNOSIS — I10 Essential (primary) hypertension: Secondary | ICD-10-CM | POA: Diagnosis not present

## 2020-10-26 HISTORY — PX: CARPAL TUNNEL RELEASE: SHX101

## 2020-10-29 DIAGNOSIS — I318 Other specified diseases of pericardium: Secondary | ICD-10-CM | POA: Diagnosis not present

## 2020-10-29 DIAGNOSIS — B3321 Viral endocarditis: Secondary | ICD-10-CM | POA: Diagnosis not present

## 2020-10-29 DIAGNOSIS — I110 Hypertensive heart disease with heart failure: Secondary | ICD-10-CM | POA: Diagnosis not present

## 2020-10-29 DIAGNOSIS — I4891 Unspecified atrial fibrillation: Secondary | ICD-10-CM | POA: Diagnosis not present

## 2020-10-29 DIAGNOSIS — I5031 Acute diastolic (congestive) heart failure: Secondary | ICD-10-CM | POA: Diagnosis not present

## 2020-10-29 DIAGNOSIS — N12 Tubulo-interstitial nephritis, not specified as acute or chronic: Secondary | ICD-10-CM | POA: Diagnosis not present

## 2020-10-29 DIAGNOSIS — G5600 Carpal tunnel syndrome, unspecified upper limb: Secondary | ICD-10-CM | POA: Diagnosis not present

## 2020-10-29 DIAGNOSIS — B0222 Postherpetic trigeminal neuralgia: Secondary | ICD-10-CM | POA: Diagnosis not present

## 2020-10-29 DIAGNOSIS — G0490 Encephalitis and encephalomyelitis, unspecified: Secondary | ICD-10-CM | POA: Diagnosis not present

## 2020-10-30 DIAGNOSIS — I10 Essential (primary) hypertension: Secondary | ICD-10-CM | POA: Diagnosis not present

## 2020-10-30 DIAGNOSIS — I480 Paroxysmal atrial fibrillation: Secondary | ICD-10-CM | POA: Diagnosis not present

## 2020-10-30 DIAGNOSIS — R600 Localized edema: Secondary | ICD-10-CM | POA: Diagnosis not present

## 2020-10-30 DIAGNOSIS — Z139 Encounter for screening, unspecified: Secondary | ICD-10-CM | POA: Diagnosis not present

## 2020-10-30 DIAGNOSIS — E785 Hyperlipidemia, unspecified: Secondary | ICD-10-CM | POA: Diagnosis not present

## 2020-11-02 DIAGNOSIS — G518 Other disorders of facial nerve: Secondary | ICD-10-CM | POA: Diagnosis not present

## 2020-11-17 ENCOUNTER — Other Ambulatory Visit: Payer: Self-pay

## 2020-11-17 ENCOUNTER — Encounter: Payer: Self-pay | Admitting: Cardiology

## 2020-11-17 ENCOUNTER — Ambulatory Visit: Payer: Medicare PPO | Admitting: Cardiology

## 2020-11-17 VITALS — BP 144/78 | HR 72 | Ht 66.0 in | Wt 165.2 lb

## 2020-11-17 DIAGNOSIS — I301 Infective pericarditis: Secondary | ICD-10-CM | POA: Diagnosis not present

## 2020-11-17 DIAGNOSIS — I10 Essential (primary) hypertension: Secondary | ICD-10-CM

## 2020-11-17 NOTE — Patient Instructions (Signed)

## 2020-11-17 NOTE — Progress Notes (Addendum)
Cardiology Office Note:    Date:  12/22/2020   ID:  Alyssa Hudson, DOB 1940/10/23, Alyssa Hudson  PCP:  Paulina FusiSchultz, Douglas E, MD  Cardiologist:  Thomasene RippleKardie Jennica Tagliaferri, DO  Electrophysiologist:  None   Referring MD: Paulina FusiSchultz, Douglas E, MD   I am doing a lot better  History of Present Illness:    Alyssa Hudson is a 80 y.o. female with a hx of of diastolic heart failure, recently diagnosed large pericarditis with pericardial fusion currently being treated with colchicineplanned for a total of 6 months, recent history of COVID-19 infection, hypertension, paroxysmal atrial fibrillation during her hospitalization presents today for posthospitalization follow-up.  I saw the patient on July 23, 2020 at that time [facility]ing[facility] flare infection where she also did suffer encephalitis.  Of note, During er hospitalization she was suspected tohave acute encephalopath in the setting of her shingle infection.His CT scan Of the head without contrast showed small vessel disease and an equivocal asymmetric increased density of the Right MCA, without acute disease process. MRI brain without contrast showed chronic lacunar infarcts vs. perivascular spaces in the bilateral BG and chronic lacunar infarct of the right caudate with tiny infarcts of bilateral cerebellar hemispheres, but no acute intracranial abnormality. EEG showed cortical dysfunction of the R temporal region and moderate diffuse encephalopathy possibly consistent with post-ictal state.CSF studies were suggestive of a viral etiology.  During her visit with me we discussed the duration of her colchicine which was initially planned for April 2022 for 6 months.  In addition we talked about the atrial fibrillation and the patient prefer to hold off on any chronic anticoagulation at that time.  At her recent visit which was in December 2021 we stopped her colchicine given the fact that the patient has  had significant diarrhea which was interfering with her daily life.  I also cut back on her hydrochlorothiazide as she also had been experiencing dizziness.  Today she tells me that she is doing well since her colchicine has been stopped no diarrhea.  She also has not had any dizziness since her hydrochlorothiazide has been stopped.  She actually is gaining her weight back and she is excited about this.  Past Medical History:  Diagnosis Date  . Acute diastolic heart failure (HCC) 06/30/2020  . Aftercare following surgery 06/09/2016  . BMI 33.0-33.9,adult   . Cervical radiculopathy due to osteoarthritis of spine   . Diverticulosis   . Encephalopathy 06/19/2020  . Facial droop   . Generalized osteoarthritis of multiple sites   . Hypertension   . Hyponatremia 06/20/2020  . Tricompartment osteoarthritis of right knee   . Viral pericarditis with pericardial effusion 06/30/2020    Past Surgical History:  Procedure Laterality Date  . CARPAL TUNNEL RELEASE Right 10/26/2020  . CATARACT EXTRACTION  2003   insert prosthetic lens  . CHOLECYSTECTOMY  01/1997  . IR FLUORO GUIDED NEEDLE PLC ASPIRATION/INJECTION LOC  06/23/2020  . RADIOLOGY WITH ANESTHESIA N/A 06/23/2020   Procedure: IR WITH ANESTHESIA;  Surgeon: Radiologist, Medication, MD;  Location: [facility] OR;  Service: Radiology;  Laterality: N/A;  . TOTAL HYSTERECTOMY  02/1997    Current Medications: Current Meds  Medication Sig  . amLODipine (NORVASC) 10 MG tablet Take 1 tablet (10 mg total) by mouth daily.  . Biotin 5000 MCG TABS Take 5,000 mcg by mouth daily.  Marland Kitchen. CRANBERRY PO Take 650 mg by mouth daily.  . finasteride (PROSCAR) 5 MG tablet Take 2.5 mg  by mouth daily.  . hydrochlorothiazide (MICROZIDE) 12.5 MG capsule Take 1 capsule (12.5 mg total) by mouth daily.  Marland Kitchen losartan (COZAAR) 100 MG tablet Take 100 mg by mouth daily.  . meloxicam (MOBIC) 15 MG tablet Take 15 mg by mouth daily.  . metoprolol succinate (TOPROL-XL) 100 MG 24 hr tablet Take  100 mg by mouth daily.  . Probiotic Product (PROBIOTIC PO) Take 1 tablet by mouth daily.  . [DISCONTINUED] LIDOCAINE EX Apply 1 application topically 3 (three) times daily as needed (shingles pain). cream (Patient not taking: Reported on 11/18/2020)  . [DISCONTINUED] LIDOCAINE PAIN RELIEF EX Place 1 patch onto the skin daily as needed (shingles pain). (Patient not taking: Reported on 11/18/2020)     Allergies:   Codeine, Contrast media [iodinated diagnostic agents], Iodine-131, and Sulfa antibiotics   Patient never smoked   Family History: The patient's family history includes Alcohol abuse in her brother; Alzheimer's disease (age of onset: 79) in her sister; CVA in her mother; Hypertension in her father and mother; Muscular dystrophy in her brother; Prostate cancer (age of onset: 83) in her father; Stroke in her brother.  ROS:   Review of Systems  Constitution: Negative for decreased appetite, fever and weight gain.  HENT: Negative for congestion, ear discharge, hoarse voice and sore throat.   Eyes: Negative for discharge, redness, vision loss in right eye and visual halos.  Cardiovascular: Negative for chest pain, dyspnea on exertion, leg swelling, orthopnea and palpitations.  Respiratory: Negative for cough, hemoptysis, shortness of breath and snoring.   Endocrine: Negative for heat intolerance and polyphagia.  Hematologic/Lymphatic: Negative for bleeding problem. Does not bruise/bleed easily.  Skin: Negative for flushing, nail changes, rash and suspicious lesions.  Musculoskeletal: Negative for arthritis, joint pain, muscle cramps, myalgias, neck pain and stiffness.  Gastrointestinal: Negative for abdominal pain, bowel incontinence, diarrhea and excessive appetite.  Genitourinary: Negative for decreased libido, genital sores and incomplete emptying.  Neurological: Negative for brief paralysis, focal weakness, headaches and loss of balance.  Psychiatric/Behavioral: Negative for altered  mental status, depression and suicidal ideas.  Allergic/Immunologic: Negative for HIV exposure and persistent infections.    EKGs/Labs/Other Studies Reviewed:    The following studies were reviewed today:   EKG: None today  Recent Labs: 06/29/2020: ALT 13; B Natriuretic Peptide 165.4; TSH 4.215 09/17/2020: BUN 42; Creatinine, Ser 1.49; Hemoglobin 13.0; Magnesium 2.0; Platelets 241; Potassium 4.1; Sodium 137  Recent Lipid Panel No results found for: CHOL, TRIG, HDL, CHOLHDL, VLDL, LDLCALC, LDLDIRECT  Physical Exam:    VS:  BP (!) 144/78   Pulse 72   Ht 5\' 6"  (1.676 m)   Wt 165 lb 3.2 oz (74.9 kg)   SpO2 98%   BMI 26.66 kg/m     Wt Readings from Last 3 Encounters:  11/18/20 163 lb (73.9 kg)  11/17/20 165 lb 3.2 oz (74.9 kg)  09/17/20 161 lb 9.6 oz (73.3 kg)     GEN: Well nourished, well developed in no acute distress HEENT: Normal NECK: No JVD; No carotid bruits LYMPHATICS: No lymphadenopathy CARDIAC: S1S2 noted,RRR, no murmurs, rubs, gallops RESPIRATORY:  Clear to auscultation without rales, wheezing or rhonchi  ABDOMEN: Soft, non-tender, non-distended, +bowel sounds, no guarding. EXTREMITIES: No edema, No cyanosis, no clubbing MUSCULOSKELETAL:  No deformity  SKIN: Warm and dry NEUROLOGIC:  Alert and oriented x 3, non-focal PSYCHIATRIC:  Normal affect, good insight  ASSESSMENT:    1. Primary hypertension   2. Viral pericarditis with pericardial effusion    PLAN:  She appears to be doing well from a cardiovascular standpoint.  No changes in her medication today.  She is not dizzy she is tolerating this dose of her antihypertensive.  Diarrhea has resolved since stopping the colchicine.  No chest pain reported.  The patient is in agreement with the above plan. The patient left the office in stable condition.  The patient will follow up in 6 months or sooner if needed.   Medication Adjustments/Labs and Tests Ordered: Current medicines are reviewed at length  with the patient today.  Concerns regarding medicines are outlined above.  No orders of the defined types were placed in this encounter.  No orders of the defined types were placed in this encounter.   Patient Instructions  Medication Instructions:  Your physician recommends that you continue on your current medications as directed. Please refer to the Current Medication list given to you today.  *If you need a refill on your cardiac medications before your next appointment, please call your pharmacy*   Lab Work: None If you have labs (blood work) drawn today and your tests are completely normal, you will receive your results only by: Marland Kitchen MyChart Message (if you have MyChart) OR . A paper copy in the mail If you have any lab test that is abnormal or we need to change your treatment, we will call you to review the results.   Testing/Procedures: None   Follow-Up: At [facility], you and your health needs are our priority.  As part of our continuing mission to provide you with exceptional heart care, we have created designated Provider Care Teams.  These Care Teams include your primary Cardiologist (physician) and Advanced Practice Providers (APPs -  Physician Assistants and Nurse Practitioners) who all work together to provide you with the care you need, when you need it.  We recommend signing up for the patient portal called "MyChart".  Sign up information is provided on this After Visit Summary.  MyChart is used to connect with patients for Virtual Visits (Telemedicine).  Patients are able to view lab/test results, encounter notes, upcoming appointments, etc.  Non-urgent messages can be sent to your provider as well.   To learn more about what you can do with MyChart, go to [email or web address].    Your next appointment:   6 month(s)  The format for your next appointment:   In Person  Provider:   Thomasene Ripple, DO   Other Instructions      Adopting a Healthy  Lifestyle.  Know what a healthy weight is for you (roughly BMI <25) and aim to maintain this   Aim for 7+ servings of fruits and vegetables daily   65-80+ fluid ounces of water or unsweet tea for healthy kidneys   Limit to max 1 drink of alcohol per day; avoid smoking/tobacco   Limit animal fats in diet for cholesterol and heart health - choose grass fed whenever available   Avoid highly processed foods, and foods high in saturated/trans fats   Aim for low stress - take time to unwind and care for your mental health   Aim for 150 min of moderate intensity exercise weekly for heart health, and weights twice weekly for bone health   Aim for 7-9 hours of sleep daily   When it comes to diets, agreement about the perfect plan isnt easy to find, even among the experts. Experts at the [facility] of [employer] developed an idea known as the Healthy Eating Plate. Just imagine a plate  divided into logical, healthy portions.   The emphasis is on diet quality:   Load up on vegetables and fruits - one-half of your plate: Aim for color and variety, and remember that potatoes dont count.   Go for whole grains - one-quarter of your plate: Whole wheat, barley, wheat berries, quinoa, oats, brown rice, and foods made with them. If you want pasta, go with whole wheat pasta.   Protein power - one-quarter of your plate: Fish, chicken, beans, and nuts are all healthy, versatile protein sources. Limit red meat.   The diet, however, does go beyond the plate, offering a few other suggestions.   Use healthy plant oils, such as olive, canola, soy, corn, sunflower and peanut. Check the labels, and avoid partially hydrogenated oil, which have unhealthy trans fats.   If youre thirsty, drink water. Coffee and tea are good in moderation, but skip sugary drinks and limit milk and dairy products to one or two daily servings.   The type of carbohydrate in the diet is more important than the amount. Some  sources of carbohydrates, such as vegetables, fruits, whole grains, and beans-are healthier than others.   Finally, stay active  Signed, Thomasene Ripple, DO  12/22/2020 12:31 PM    [facility]

## 2020-11-17 NOTE — Progress Notes (Signed)
[facility]    Provider:  Dr Lucia Gaskins Requesting Provider: Paulina Fusi, MD Primary Care Provider:  Paulina Fusi, MD  CC:  Left foot drop  HPI:  Alyssa Hudson is a 80 y.o. female here as requested by Paulina Fusi, MD for left foot drop. PMHx heart failure, A. fib, hypertension, cervical radiculopathy, ulnar neuropathy and carpal tunnel syndrome, facial droop (known for which she gets Botox every 3 months), osteoarthrosis of multiple sites, was admitted to [facility] September 2021 for altered mental status and resolving left-sided weakness diagnosed with likely multifactorial toxic metabolic encephalopathy from hyponatremia, deranged renal function as well as UTI could not rule out encephalitis,.  I reviewed Dr. Fabio Asa notes: Patient presented with a foot drop in December 2021, gait condition occurred without any known injury, symptoms located in the left foot, onset was gradual over 2 months starting in October 2021, moderate in severity and unchanged, physical therapy and using a walker, still having issues with gait due to dragging of her left foot which is caused her to fall twice.  Physical exam was unremarkable with normal ears, nose, throat, chest and lung exam, cardiovascular exam, neurologic exam noted left foot drop.  Patient is here with her husband who also provides information.  She states her left foot is weak and numb.  She also states both feet are numb on the top of the feet and slightly up the leg lateral aspect, no pain, improving, no recent falls.  Patient had shingles a few months ago and was told it affected her brain that she had encephalitis.  She then got Covid after going home, felt x4, left foot dragging. The left foot drop started after the hospital. It is getting better. She is exercising. She does not need to wear a brace. She has numbness in the feet that just started since the hospital. No weakness on the right foot. Numbness is on  the top of the left foot. No back pain. The right is also numb on the top of the foot and up the right later a little. Improving. No other focal neurologic deficits, associated symptoms, inciting events or modifiable factors.  Reviewed notes, labs and imaging from outside physicians, which showed:  MRI brain 05/2020:personally reviewed images and agree  Widely scattered and mostly subcortical bilateral white matter T2 and FLAIR hyperintensity. Chronic lacunar infarcts versus perivascular spaces in the bilateral basal ganglia. There is a chronic lacunar infarct of the right caudate superimposed. Negative brainstem. Several tiny chronic infarcts in both cerebellar hemispheres.  No midline shift, mass effect, evidence of mass lesion, ventriculomegaly, extra-axial collection or acute intracranial hemorrhage. Cervicomedullary junction and pituitary are within normal limits. No chronic cerebral blood products identified. Following contrast no abnormal enhancement is identified. No dural thickening is evident.  Vascular: Major intracranial vascular flow voids are preserved.  Skull and upper cervical spine: Normal for age visible cervical spine, bone marrow signal.  Sinuses/Orbits: Negative.  Other: None  IMPRESSION: Mildly motion degraded study but no acute intracranial abnormality is identified.  Chronic small vessel disease.   Review of Systems: Patient complains of symptoms per HPI as well as the following symptoms: left foot weakness. Pertinent negatives and positives per HPI. All others negative.   Social History   Socioeconomic History  . Marital status: Married    Spouse name: Not on file  . Number of children: 2  . Years of education: Not on file  . Highest education level: High school graduate  Occupational History  . Not on file  Tobacco Use  . Smoking status: Never Smoker  . Smokeless tobacco: Never Used  Substance and Sexual Activity  . Alcohol use:  Never  . Drug use: Never  . Sexual activity: Not on file  Other Topics Concern  . Not on file  Social History Narrative   Lives at home with husband   Caffeine: none    Social Determinants of Health   Financial Resource Strain: Not on file  Food Insecurity: Not on file  Transportation Needs: Not on file  Physical Activity: Not on file  Stress: Not on file  Social Connections: Not on file  Intimate Partner Violence: Not on file    Family History  Problem Relation Age of Onset  . Hypertension Mother   . CVA Mother   . Prostate cancer Father 16  . Hypertension Father   . Alzheimer's disease Sister 40  . Muscular dystrophy Brother   . Alcohol abuse Brother   . Stroke Brother     Past Medical History:  Diagnosis Date  . Acute diastolic heart failure (HCC) 06/30/2020  . Aftercare following surgery 06/09/2016  . BMI 33.0-33.9,adult   . Cervical radiculopathy due to osteoarthritis of spine   . Diverticulosis   . Encephalopathy 06/19/2020  . Facial droop   . Generalized osteoarthritis of multiple sites   . Hypertension   . Hyponatremia 06/20/2020  . Tricompartment osteoarthritis of right knee   . Viral pericarditis with pericardial effusion 06/30/2020    Patient Active Problem List   Diagnosis Date Noted  . Hypertension   . Facial droop   . Carpal tunnel syndrome, right 08/13/2020  . Cervical radiculopathy 08/13/2020  . Ulnar neuropathy at elbow, right 08/13/2020  . Atrial fibrillation (HCC) 07/24/2020  . Acute pericarditis 07/24/2020  . Primary hypertension 07/24/2020  . Viral pericarditis with pericardial effusion 06/30/2020  . Acute diastolic heart failure (HCC) 06/30/2020  . Hyponatremia 06/20/2020  . Encephalopathy 06/19/2020  . Aftercare following surgery 06/09/2016    Past Surgical History:  Procedure Laterality Date  . CARPAL TUNNEL RELEASE Right 10/26/2020  . CATARACT EXTRACTION  2003   insert prosthetic lens  . CHOLECYSTECTOMY  01/1997  . IR FLUORO  GUIDED NEEDLE PLC ASPIRATION/INJECTION LOC  06/23/2020  . RADIOLOGY WITH ANESTHESIA N/A 06/23/2020   Procedure: IR WITH ANESTHESIA;  Surgeon: Radiologist, Medication, MD;  Location: [facility] OR;  Service: Radiology;  Laterality: N/A;  . TOTAL HYSTERECTOMY  02/1997    Current Outpatient Medications  Medication Sig Dispense Refill  . amLODipine (NORVASC) 10 MG tablet Take 1 tablet (10 mg total) by mouth daily. 30 tablet 0  . Biotin 5000 MCG TABS Take 5,000 mcg by mouth daily.    Marland Kitchen CALCIUM PO Take 600 mg by mouth daily.    Marland Kitchen CRANBERRY PO Take 650 mg by mouth daily.    . finasteride (PROSCAR) 5 MG tablet Take 2.5 mg by mouth daily.    . hydrochlorothiazide (MICROZIDE) 12.5 MG capsule Take 1 capsule (12.5 mg total) by mouth daily. 90 capsule 3  . losartan (COZAAR) 100 MG tablet Take 100 mg by mouth daily.    Marland Kitchen MAGNESIUM PO Take 400 mg by mouth daily.    . meloxicam (MOBIC) 15 MG tablet Take 15 mg by mouth daily.    . metoprolol succinate (TOPROL-XL) 100 MG 24 hr tablet Take 100 mg by mouth daily.    Marland Kitchen OVER THE COUNTER MEDICATION Take 4 capsules by mouth daily. MACULAR PROTECT    .  Probiotic Product (PROBIOTIC PO) Take 1 tablet by mouth daily.     No current facility-administered medications for this visit.    Allergies as of 11/18/2020 - Review Complete 11/18/2020  Allergen Reaction Noted  . Codeine Nausea Only and Nausea And Vomiting 05/25/2016  . Contrast media [iodinated diagnostic agents] Other (See Comments) 06/19/2020  . Iodine-131 Hives and Swelling 05/26/2016  . Sulfa antibiotics Nausea Only 05/26/2016    Vitals: BP (!) 158/75 (BP Location: Left Arm, Patient Position: Sitting)   Pulse 74   Ht 5\' 6"  (1.676 m)   Wt 163 lb (73.9 kg)   BMI 26.31 kg/m  Last Weight:  Wt Readings from Last 1 Encounters:  11/18/20 163 lb (73.9 kg)   Last Height:   Ht Readings from Last 1 Encounters:  11/18/20 5\' 6"  (1.676 m)     Physical exam: Exam: Gen: NAD, conversant, well nourised, obese,  well groomed                     CV: RRR, no MRG. No Carotid Bruits.  Eyes: Conjunctivae clear without exudates or hemorrhage  Neuro: Detailed Neurologic Exam  Speech:    Speech is normal; fluent and spontaneous with normal comprehension.  Cognition:    The patient is oriented to person, place, and time;     recent and remote memory intact;     language fluent;     normal attention, concentration,     fund of knowledge Cranial Nerves:    The pupils are equal, round, and reactive to light. Pupils too small to visualize fundi. Visual fields are full to threat. Extraocular movements are intact. Trigeminal sensation is intact and the muscles of mastication are normal. Left facial droop(chronic). The palate elevates in the midline. Hearing intact. Voice is normal. Shoulder shrug is normal. The tongue has normal motion without fasciculations.   Coordination:    No dysmetria or ataxia  Gait:    Uses a cane, good stride, no steppage gait  Motor Observation:    No asymmetry, no atrophy, and no involuntary movements noted. Tone:    Normal muscle tone.    Posture:    Posture is normal. normal erect    Strength: left foot dosiflexion and eversion 4/5, intact inversion. Right foot 5- dorsiflexion. She has generalized weakness throughout, more so in the right deltoid (4/5).      Sensation: intact to LT     Reflex Exam:  DTR's:   Absent AJs. Deferred right patellar(n a knee brace) Otherwise deep tendon reflexes in the upper and lower extremities are symmetrical bilaterally.   Toes:    The toes are downgoing bilaterally.   Clonus:    Clonus is absent.    Assessment/Plan:  [age] year old patient with left peroneal neuropathy after prolonged immobilization (hospital admission). She is much improved. Slight peroneal neuropathy right foot as well. Discussed in detail, answered all questions. No back pain. I will check B12 and HgbA1c as b12 deficiency and diabetes can predispose to nerve  damage but otherwise follow clinically.  Orders Placed This Encounter  Procedures  . Vitamin B12  . Methylmalonic acid, serum  . Hemoglobin A1c    Cc: Lovely, MD,  94, MD  Paulina Fusi, MD  [facility] [address] [place], [address] [place]  Phone [phone] Fax [phone]

## 2020-11-18 ENCOUNTER — Ambulatory Visit: Payer: Medicare PPO | Admitting: Neurology

## 2020-11-18 ENCOUNTER — Encounter: Payer: Self-pay | Admitting: Neurology

## 2020-11-18 VITALS — BP 158/75 | HR 74 | Ht 66.0 in | Wt 163.0 lb

## 2020-11-18 DIAGNOSIS — G5732 Lesion of lateral popliteal nerve, left lower limb: Secondary | ICD-10-CM | POA: Diagnosis not present

## 2020-11-18 NOTE — Patient Instructions (Signed)
Common Peroneal Nerve Entrapment  Common peroneal nerve entrapment is a condition that can make it hard to lift a foot. The condition results from pressure on a nerve in the lower leg called the common peroneal nerve. Your common peroneal nerve provides feeling to your outer lower leg and foot. It also supplies the muscles that move your foot and toes upward and outward. What are the causes? This condition may be caused by:  Sitting cross-legged, squatting, laying or kneeling for long periods of time.  A hard, direct hit to the side of the lower leg.  Swelling from a knee injury.  A break (fracture) in one of the lower leg bones.  Wearing a boot or cast that ends just below the knee.  A growth or cyst near the nerve. What increases the risk? This condition is more likely to develop in people who play:  Contact sports, such as football or hockey.  Sports where you wear high and stiff boots, such as skiing. What are the signs or symptoms? Symptoms of this condition include:  Trouble lifting your foot up (foot drop).  Tripping often.  Your foot hitting the ground harder than normal as you walk.  Numbness, tingling, or pain in the outside of the knee, outside of the lower leg, and top of the foot.  Sensitivity to pressure on the front or side of the leg. How is this diagnosed? This condition may be diagnosed based on:  Your symptoms.  Your medical history.  A physical exam.  Tests, such as: ? An X-ray to check the bones of your knee and leg. ? MRI to check tendons that attach to the side of your knee. ? An ultrasound to check for a growth or cyst. ? An electromyogram (EMG) to check your nerves. During your physical exam, your health care provider will check for numbness in your leg and test the strength of your lower leg muscles. He or she may tap the side of your lower leg to see if that causes tingling. How is this treated? Treatment for this condition may  include:  Avoiding activities that make symptoms worse.  Using a brace to hold up your foot and toes.  Taking anti-inflammatory pain medicines to relieve swelling and lessen pain.  Having medicines injected into your ankle joint to lessen pain and swelling.  Doing exercises to help you regain or maintain movement (physical therapy).  Surgery to take pressure off the nerve. This may be needed if there is no improvement after 2-3 months or if there is a growth pushing on the nerve.  Returning gradually to full activity. Follow these instructions at home: If you have a brace:  Wear it as told by your health care provider. Remove it only as told by your health care provider.  Loosen the brace if your toes tingle, become numb, or turn cold and blue.  Keep the brace clean.  If the brace is not waterproof: ? Do not let it get wet. ? Cover it with a watertight covering when you take a bath or a shower.  Ask your health care provider when it is safe to drive with a brace on your foot. Activity  Return to your normal activities as told by your health care provider. Ask your health care provider what activities are safe for you.  Do not do any activities that make pain or swelling worse.  Do exercises as told by your health care provider. General instructions  Take over-the-counter and  prescription medicines only as told by your health care provider.  Do not put your full weight on your knee until your health care provider says you can. Use crutches as directed by your health care provider.  Keep all follow-up visits as told by your health care provider. This is important. How is this prevented?  Wear supportive footwear that is appropriate for your athletic activity.  Avoid athletic activities that cause ankle pain or swelling.  Wear protective padding over your lower legs when playing contact sports.  Make sure your boots do not put extra pressure on the area just below your  knees.  Do not sit cross-legged for long periods of time. Contact a health care provider if:  Your symptoms do not get better in 2-3 months.  The weakness or numbness in your leg or foot gets worse. Summary  Common peroneal nerve entrapment is a condition that results from pressure on a nerve in the lower leg called the common peroneal nerve.  This condition may be caused by a hard hit, swelling, a fracture, or a cyst in the lower leg.  Treatment may include rest, a brace, medicines, and physical therapy. Sometimes surgery is needed.  Do not do any activities that make pain or swelling worse. This information is not intended to replace advice given to you by your health care provider. Make sure you discuss any questions you have with your health care provider. Document Revised: 07/23/2018 Document Reviewed: 07/23/2018 Elsevier Patient Education  2021 [employer].

## 2020-11-26 LAB — HEMOGLOBIN A1C
Est. average glucose Bld gHb Est-mCnc: 108 mg/dL
Hgb A1c MFr Bld: 5.4 % (ref 4.8–5.6)

## 2020-11-26 LAB — METHYLMALONIC ACID, SERUM: Methylmalonic Acid: 464 nmol/L — ABNORMAL HIGH (ref 0–378)

## 2020-11-26 LAB — VITAMIN B12: Vitamin B-12: 906 pg/mL (ref 232–1245)

## 2020-11-30 DIAGNOSIS — L309 Dermatitis, unspecified: Secondary | ICD-10-CM | POA: Diagnosis not present

## 2020-12-09 DIAGNOSIS — N644 Mastodynia: Secondary | ICD-10-CM | POA: Diagnosis not present

## 2020-12-16 DIAGNOSIS — R922 Inconclusive mammogram: Secondary | ICD-10-CM | POA: Diagnosis not present

## 2020-12-16 DIAGNOSIS — N644 Mastodynia: Secondary | ICD-10-CM | POA: Diagnosis not present

## 2020-12-18 ENCOUNTER — Telehealth: Payer: Self-pay | Admitting: Cardiology

## 2020-12-18 NOTE — Telephone Encounter (Signed)
Patient's spouse is calling regarding the patient's appointment notes from 11/17/20 appointment with Dr. Servando Salina. In 11/17/20 appointment notes, under social hx it states the patient is a former smoker. Patient's spouse states the patient has never been a smoker. He would like to have this correct in her chart. Please assist.

## 2020-12-22 ENCOUNTER — Encounter: Payer: Self-pay | Admitting: Cardiology

## 2021-01-28 DIAGNOSIS — M25571 Pain in right ankle and joints of right foot: Secondary | ICD-10-CM | POA: Diagnosis not present

## 2021-01-28 DIAGNOSIS — I10 Essential (primary) hypertension: Secondary | ICD-10-CM | POA: Diagnosis not present

## 2021-01-28 DIAGNOSIS — E785 Hyperlipidemia, unspecified: Secondary | ICD-10-CM | POA: Diagnosis not present

## 2021-01-28 DIAGNOSIS — I480 Paroxysmal atrial fibrillation: Secondary | ICD-10-CM | POA: Diagnosis not present

## 2021-01-28 DIAGNOSIS — M898X6 Other specified disorders of bone, lower leg: Secondary | ICD-10-CM | POA: Diagnosis not present

## 2021-01-28 DIAGNOSIS — M25561 Pain in right knee: Secondary | ICD-10-CM | POA: Diagnosis not present

## 2021-01-28 DIAGNOSIS — R600 Localized edema: Secondary | ICD-10-CM | POA: Diagnosis not present

## 2021-02-03 DIAGNOSIS — M1711 Unilateral primary osteoarthritis, right knee: Secondary | ICD-10-CM | POA: Diagnosis not present

## 2021-02-11 DIAGNOSIS — Z20828 Contact with and (suspected) exposure to other viral communicable diseases: Secondary | ICD-10-CM | POA: Diagnosis not present

## 2021-02-11 DIAGNOSIS — J069 Acute upper respiratory infection, unspecified: Secondary | ICD-10-CM | POA: Diagnosis not present

## 2021-02-11 DIAGNOSIS — R051 Acute cough: Secondary | ICD-10-CM | POA: Diagnosis not present

## 2021-02-17 DIAGNOSIS — G518 Other disorders of facial nerve: Secondary | ICD-10-CM | POA: Diagnosis not present

## 2021-02-25 DIAGNOSIS — B029 Zoster without complications: Secondary | ICD-10-CM | POA: Diagnosis not present

## 2021-05-07 DIAGNOSIS — M79604 Pain in right leg: Secondary | ICD-10-CM | POA: Diagnosis not present

## 2021-05-07 DIAGNOSIS — M79605 Pain in left leg: Secondary | ICD-10-CM | POA: Diagnosis not present

## 2021-05-07 DIAGNOSIS — E785 Hyperlipidemia, unspecified: Secondary | ICD-10-CM | POA: Diagnosis not present

## 2021-05-07 DIAGNOSIS — I10 Essential (primary) hypertension: Secondary | ICD-10-CM | POA: Diagnosis not present

## 2021-05-07 DIAGNOSIS — I480 Paroxysmal atrial fibrillation: Secondary | ICD-10-CM | POA: Diagnosis not present

## 2021-05-07 DIAGNOSIS — R600 Localized edema: Secondary | ICD-10-CM | POA: Diagnosis not present

## 2021-05-07 DIAGNOSIS — I5031 Acute diastolic (congestive) heart failure: Secondary | ICD-10-CM | POA: Diagnosis not present

## 2021-05-19 DIAGNOSIS — G518 Other disorders of facial nerve: Secondary | ICD-10-CM | POA: Diagnosis not present

## 2021-05-21 DIAGNOSIS — M1711 Unilateral primary osteoarthritis, right knee: Secondary | ICD-10-CM | POA: Insufficient documentation

## 2021-05-21 DIAGNOSIS — M4722 Other spondylosis with radiculopathy, cervical region: Secondary | ICD-10-CM | POA: Insufficient documentation

## 2021-05-21 DIAGNOSIS — K5790 Diverticulosis of intestine, part unspecified, without perforation or abscess without bleeding: Secondary | ICD-10-CM | POA: Insufficient documentation

## 2021-05-21 DIAGNOSIS — Z6833 Body mass index (BMI) 33.0-33.9, adult: Secondary | ICD-10-CM | POA: Insufficient documentation

## 2021-05-21 DIAGNOSIS — M159 Polyosteoarthritis, unspecified: Secondary | ICD-10-CM | POA: Insufficient documentation

## 2021-05-25 ENCOUNTER — Ambulatory Visit: Payer: Medicare PPO | Admitting: Cardiology

## 2021-05-25 ENCOUNTER — Encounter: Payer: Self-pay | Admitting: Cardiology

## 2021-05-25 ENCOUNTER — Other Ambulatory Visit: Payer: Self-pay

## 2021-05-25 VITALS — BP 154/68 | HR 77 | Ht 65.6 in | Wt 166.0 lb

## 2021-05-25 DIAGNOSIS — I480 Paroxysmal atrial fibrillation: Secondary | ICD-10-CM | POA: Diagnosis not present

## 2021-05-25 DIAGNOSIS — I10 Essential (primary) hypertension: Secondary | ICD-10-CM | POA: Diagnosis not present

## 2021-05-25 DIAGNOSIS — Z6833 Body mass index (BMI) 33.0-33.9, adult: Secondary | ICD-10-CM

## 2021-05-25 DIAGNOSIS — I301 Infective pericarditis: Secondary | ICD-10-CM

## 2021-05-25 NOTE — Patient Instructions (Signed)

## 2021-05-25 NOTE — Progress Notes (Signed)
Cardiology Office Note:    Date:  05/25/2021   ID:  Alyssa Hudson, DOB November 06, 1940, MRN [id number]  PCP:  Paulina Fusi, MD  Cardiologist:  Thomasene Ripple, DO  Electrophysiologist:  None   Referring MD: Paulina Fusi, MD    History of Present Illness:    Alyssa Hudson is a 80 y.o. female with a hx of pericarditis with pericardial fusion currently being treated with colchicine planned for a total of 6 months, recent history of COVID-19 infection, hypertension, paroxysmal atrial fibrillation during her hospitalization presents today for a follow-up visit.  I saw the patient on July 23, 2020 at that time she was post hospitalization for [facility] from shingles flare infection where she also did suffer encephalitis.  Of note, During er hospitalization she was suspected to have acute encephalopath in the setting of her shingle infection. His CT scan  Of the head without contrast showed small vessel disease and an equivocal asymmetric increased density of the Right MCA, without acute disease process. MRI brain without contrast showed chronic lacunar infarcts vs. perivascular spaces in the bilateral BG and chronic lacunar infarct of the right caudate with tiny infarcts of bilateral cerebellar hemispheres, but no acute intracranial abnormality. EEG showed cortical dysfunction of the R temporal region and moderate diffuse encephalopathy possibly consistent with post-ictal state. CSF studies were suggestive of a viral etiology. During her visit with me we discussed the duration of her colchicine which was initially planned for April 2022 for 6 months.  In addition we talked about the atrial fibrillation and the patient prefer to hold off on any chronic anticoagulation at that time.   At her visit which was in December 2021 we stopped her colchicine given the fact that the patient has had significant diarrhea which was interfering with her daily life.  I also cut back on her  hydrochlorothiazide as she also had been experiencing dizziness.  I saw the patient on November 17, 2020 at that time she was doing well from a cardiovascular standpoint.  She was tolerating her blood pressure medications she was not dizzy and her diarrhea had resolved after stopping her colchicine.  She is here today for follow-up visit.  She has no complaints at this time.  Past Medical History:  Diagnosis Date   Acute diastolic heart failure (HCC) 06/30/2020   Acute pericarditis 07/24/2020   Aftercare following surgery 06/09/2016   Atrial fibrillation (HCC) 07/24/2020   BMI 33.0-33.9,adult    Carpal tunnel syndrome, right 08/13/2020   Cervical radiculopathy 08/13/2020   Cervical radiculopathy due to osteoarthritis of spine    Diverticulosis    Encephalopathy 06/19/2020   Facial droop    Generalized osteoarthritis of multiple sites    Hypertension    Hyponatremia 06/20/2020   Primary hypertension 07/24/2020   Tricompartment osteoarthritis of right knee    Ulnar neuropathy at elbow, right 08/13/2020   Viral pericarditis with pericardial effusion 06/30/2020    Past Surgical History:  Procedure Laterality Date   CARPAL TUNNEL RELEASE Right 10/26/2020   CATARACT EXTRACTION  2003   insert prosthetic lens   CHOLECYSTECTOMY  01/1997   IR FLUORO GUIDED NEEDLE PLC ASPIRATION/INJECTION LOC  06/23/2020   RADIOLOGY WITH ANESTHESIA N/A 06/23/2020   Procedure: IR WITH ANESTHESIA;  Surgeon: Radiologist, Medication, MD;  Location: [facility] OR;  Service: Radiology;  Laterality: N/A;   TOTAL HYSTERECTOMY  02/1997    Current Medications: No outpatient medications have been marked as taking for the 05/25/21 encounter (Appointment) with  Azyiah Bo, DO.     Allergies:   Codeine, Contrast media [iodinated diagnostic agents], Iodine-131, and Sulfa antibiotics   Social History   Socioeconomic History   Marital status: Married    Spouse name: Not on file   Number of children: 2   Years of education:  Not on file   Highest education level: High school graduate  Occupational History   Not on file  Tobacco Use   Smoking status: Never   Smokeless tobacco: Never  Substance and Sexual Activity   Alcohol use: Never   Drug use: Never   Sexual activity: Not on file  Other Topics Concern   Not on file  Social History Narrative   Lives at home with husband   Caffeine: none    Social Determinants of Health   Financial Resource Strain: Not on file  Food Insecurity: Not on file  Transportation Needs: Not on file  Physical Activity: Not on file  Stress: Not on file  Social Connections: Not on file     Family History: The patient's family history includes Alcohol abuse in her brother; Alzheimer's disease (age of onset: 104) in her sister; CVA in her mother; Hypertension in her father and mother; Muscular dystrophy in her brother; Prostate cancer (age of onset: 60) in her father; Stroke in her brother.  ROS:   Review of Systems  Constitution: Negative for decreased appetite, fever and weight gain.  HENT: Negative for congestion, ear discharge, hoarse voice and sore throat.   Eyes: Negative for discharge, redness, vision loss in right eye and visual halos.  Cardiovascular: Negative for chest pain, dyspnea on exertion, leg swelling, orthopnea and palpitations.  Respiratory: Negative for cough, hemoptysis, shortness of breath and snoring.   Endocrine: Negative for heat intolerance and polyphagia.  Hematologic/Lymphatic: Negative for bleeding problem. Does not bruise/bleed easily.  Skin: Negative for flushing, nail changes, rash and suspicious lesions.  Musculoskeletal: Negative for arthritis, joint pain, muscle cramps, myalgias, neck pain and stiffness.  Gastrointestinal: Negative for abdominal pain, bowel incontinence, diarrhea and excessive appetite.  Genitourinary: Negative for decreased libido, genital sores and incomplete emptying.  Neurological: Negative for brief paralysis, focal  weakness, headaches and loss of balance.  Psychiatric/Behavioral: Negative for altered mental status, depression and suicidal ideas.  Allergic/Immunologic: Negative for HIV exposure and persistent infections.    EKGs/Labs/Other Studies Reviewed:    The following studies were reviewed today:   EKG:  The ekg ordered today demonstrates sinus rhythm, heart rate 77 bpm with first-degree AV block and EKG suggesting old septal wall infarction.  No significant change compared to prior EKG  Recent Labs: 06/29/2020: ALT 13; B Natriuretic Peptide 165.4; TSH 4.215 09/17/2020: BUN 42; Creatinine, Ser 1.49; Hemoglobin 13.0; Magnesium 2.0; Platelets 241; Potassium 4.1; Sodium 137  Recent Lipid Panel No results found for: CHOL, TRIG, HDL, CHOLHDL, VLDL, LDLCALC, LDLDIRECT  Physical Exam:    VS:  There were no vitals taken for this visit.    Wt Readings from Last 3 Encounters:  11/18/20 163 lb (73.9 kg)  11/17/20 165 lb 3.2 oz (74.9 kg)  09/17/20 161 lb 9.6 oz (73.3 kg)     GEN: Well nourished, well developed in no acute distress HEENT: Normal NECK: No JVD; No carotid bruits LYMPHATICS: No lymphadenopathy CARDIAC: S1S2 noted,RRR, no murmurs, rubs, gallops RESPIRATORY:  Clear to auscultation without rales, wheezing or rhonchi  ABDOMEN: Soft, non-tender, non-distended, +bowel sounds, no guarding. EXTREMITIES: No edema, No cyanosis, no clubbing MUSCULOSKELETAL:  No deformity  SKIN:  Warm and dry NEUROLOGIC:  Alert and oriented x 3, non-focal PSYCHIATRIC:  Normal affect, good insight  ASSESSMENT:    1. Primary hypertension   2. Viral pericarditis with pericardial effusion   3. Paroxysmal atrial fibrillation (HCC)   4. BMI 33.0-33.9,adult    PLAN:     1.  Her blood pressure slightly elevated in the office today.  But she tells me at home her systolic blood pressure has been running in the 130s.  For now to evaluate sustained hypotension I am going to continue her current medication  regimen. She has been treated completely for her viral pericarditis there is no need for any testing or retreatment.  The patient is in agreement with the above plan. The patient left the office in stable condition.  The patient will follow up in 1 year with Dr. Tomie China.   Medication Adjustments/Labs and Tests Ordered: Current medicines are reviewed at length with the patient today.  Concerns regarding medicines are outlined above.  No orders of the defined types were placed in this encounter.  No orders of the defined types were placed in this encounter.   There are no Patient Instructions on file for this visit.   Adopting a Healthy Lifestyle.  Know what a healthy weight is for you (roughly BMI <25) and aim to maintain this   Aim for 7+ servings of fruits and vegetables daily   65-80+ fluid ounces of water or unsweet tea for healthy kidneys   Limit to max 1 drink of alcohol per day; avoid smoking/tobacco   Limit animal fats in diet for cholesterol and heart health - choose grass fed whenever available   Avoid highly processed foods, and foods high in saturated/trans fats   Aim for low stress - take time to unwind and care for your mental health   Aim for 150 min of moderate intensity exercise weekly for heart health, and weights twice weekly for bone health   Aim for 7-9 hours of sleep daily   When it comes to diets, agreement about the perfect plan isnt easy to find, even among the experts. Experts at the [facility] [employer] developed an idea known as the Healthy Eating Plate. Just imagine a plate divided into logical, healthy portions.   The emphasis is on diet quality:   Load up on vegetables and fruits - one-half of your plate: Aim for color and variety, and remember that potatoes dont count.   Go for whole grains - one-quarter of your plate: Whole wheat, barley, wheat berries, quinoa, oats, brown rice, and foods made with them. If you want pasta, go  with whole wheat pasta.   Protein power - one-quarter of your plate: Fish, chicken, beans, and nuts are all healthy, versatile protein sources. Limit red meat.   The diet, however, does go beyond the plate, offering a few other suggestions.   Use healthy plant oils, such as olive, canola, soy, corn, sunflower and peanut. Check the labels, and avoid partially hydrogenated oil, which have unhealthy trans fats.   If youre thirsty, drink water. Coffee and tea are good in moderation, but skip sugary drinks and limit milk and dairy products to one or two daily servings.   The type of carbohydrate in the diet is more important than the amount. Some sources of carbohydrates, such as vegetables, fruits, whole grains, and beans-are healthier than others.   Finally, stay active  Signed, Thomasene Ripple, DO  05/25/2021 11:18 AM    [facility]

## 2021-05-26 DIAGNOSIS — D485 Neoplasm of uncertain behavior of skin: Secondary | ICD-10-CM | POA: Diagnosis not present

## 2021-06-18 IMAGING — CT CT HEAD CODE STROKE
4 of 5 series · 15 of 47 positions shown, 17 images · non-contrast
Comparison: None.

CLINICAL DATA: Code stroke. 79-year-old female with altered mental
status.

EXAM:
CT HEAD WITHOUT CONTRAST
TECHNIQUE: Contiguous axial images were obtained from the base of the skull
through the vertex without intravenous contrast.

[Series 3: head wo · axial · 0.45mm/px · z∈[-138,-33]mm · 4 of 35 slices shown (1 of 2)]
[im 7/35  brain]
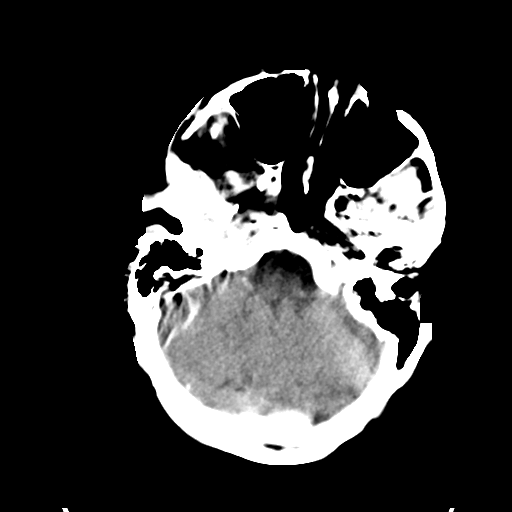
[im 14/35  brain]
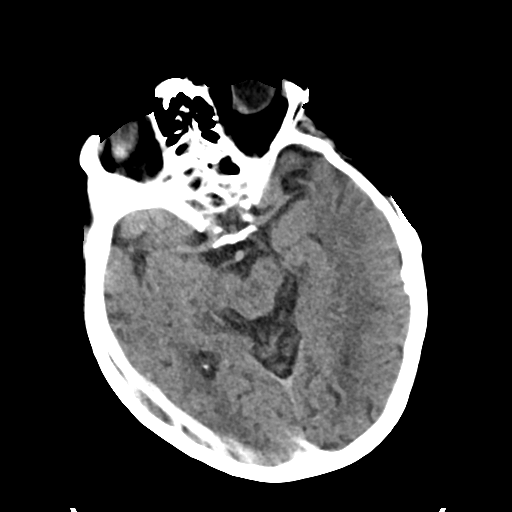
[im 21/35  brain]
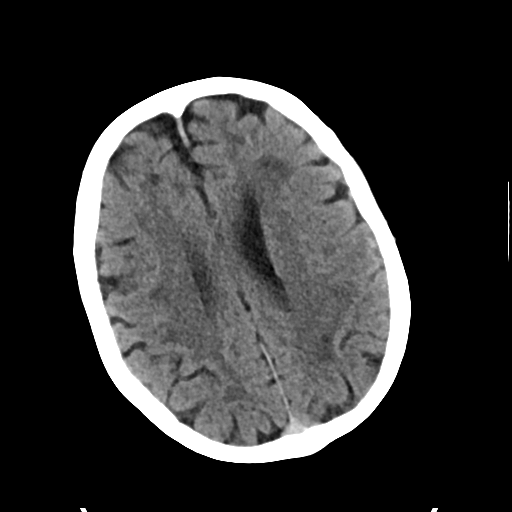
[im 28/35  brain]
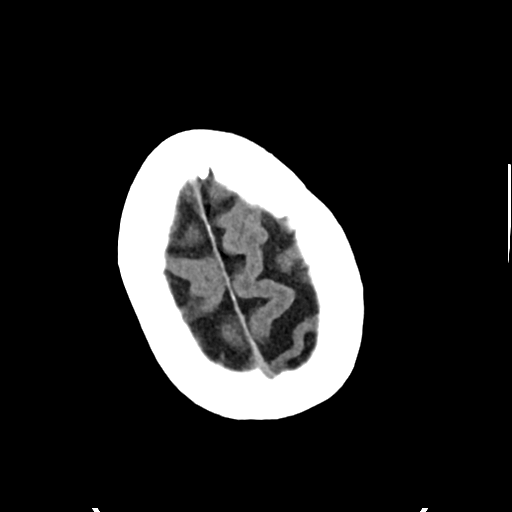

[Series 4: head wo · axial · 0.45mm/px · z∈[-143,-28]mm · 5 of 35 slices shown, 7 images (2 of 2)]
[im 6/35  brain]
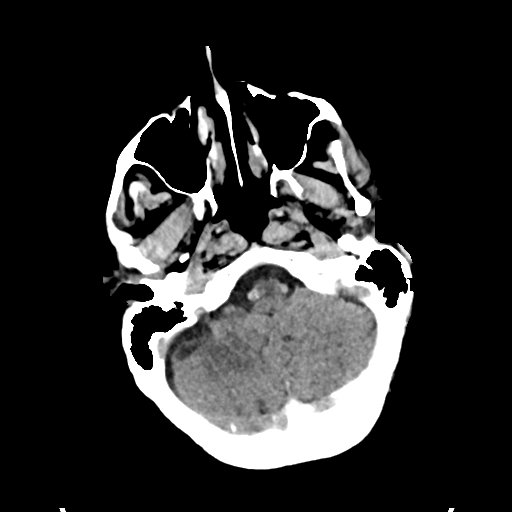
[im 6/35  bone]
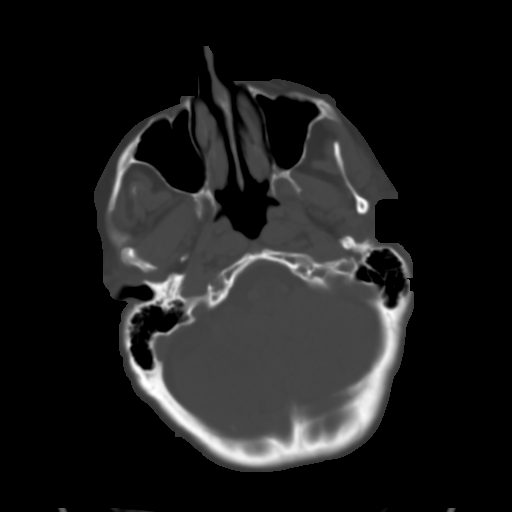
[im 12/35  brain]
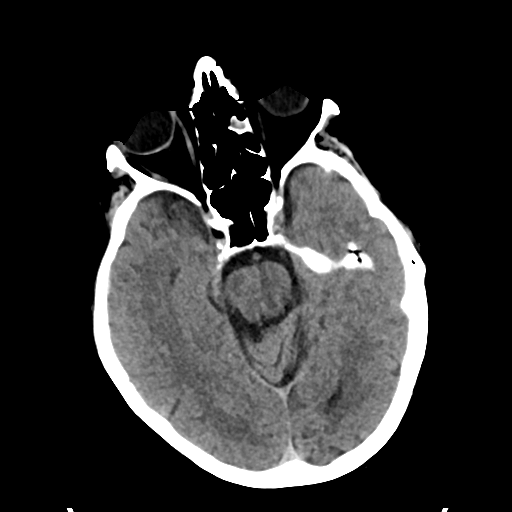
[im 18/35  brain]
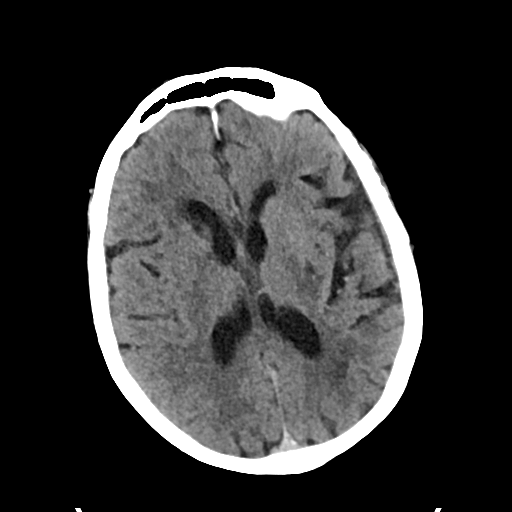
[im 23/35  brain]
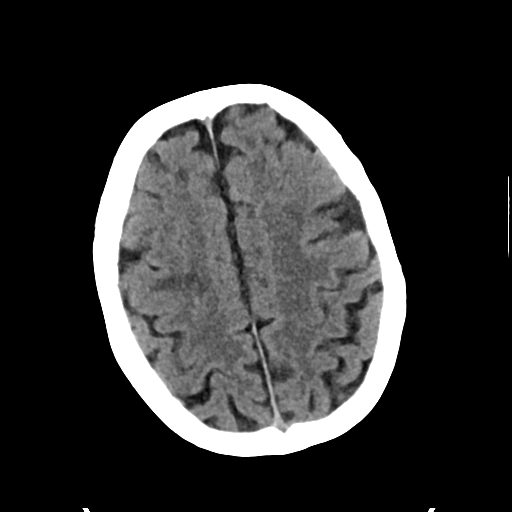
[im 29/35  brain]
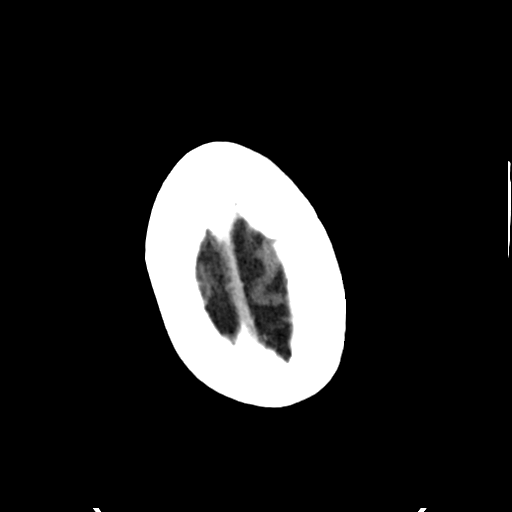
[im 29/35  bone]
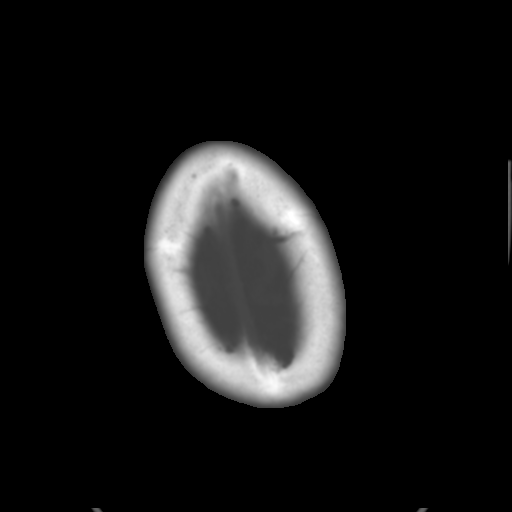

[Series 6: cor soft · coronal · 0.34mm/px · 3 of 72 slices shown]
[im 24/72  brain]
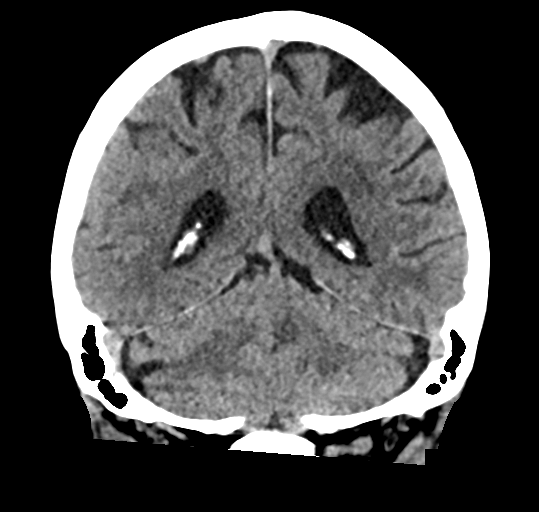
[im 32/72  brain]
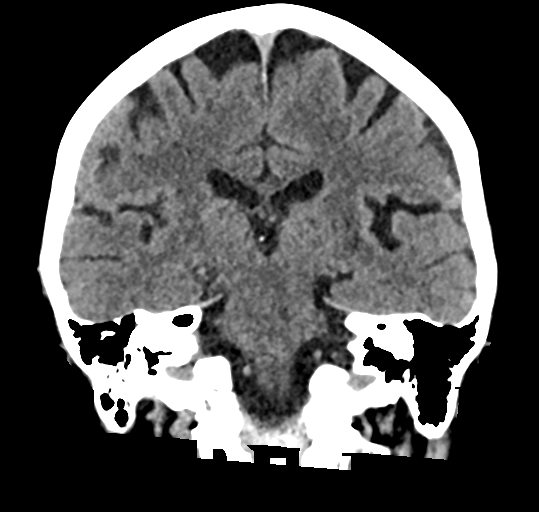
[im 40/72  brain]
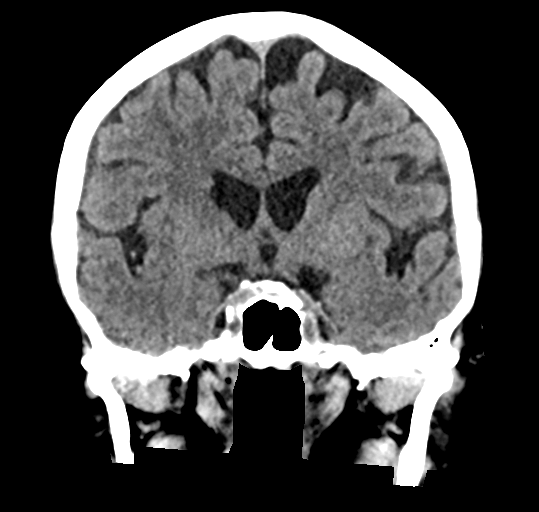

[Series 7: sag soft · sagittal · 0.34mm/px · 3 of 61 slices shown]
[im 21/61  brain]
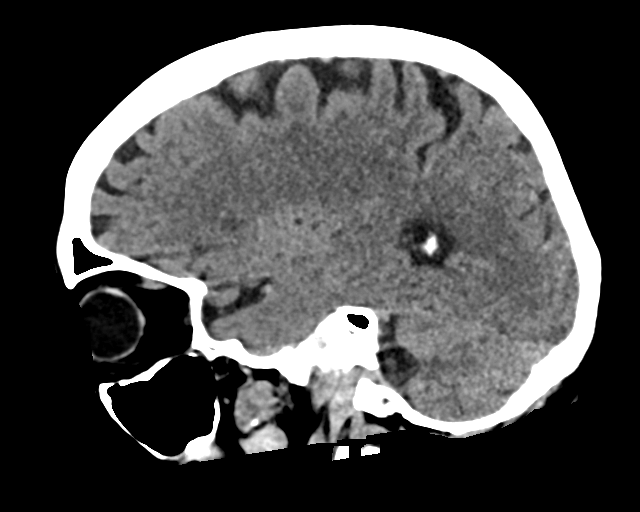
[im 31/61  brain]
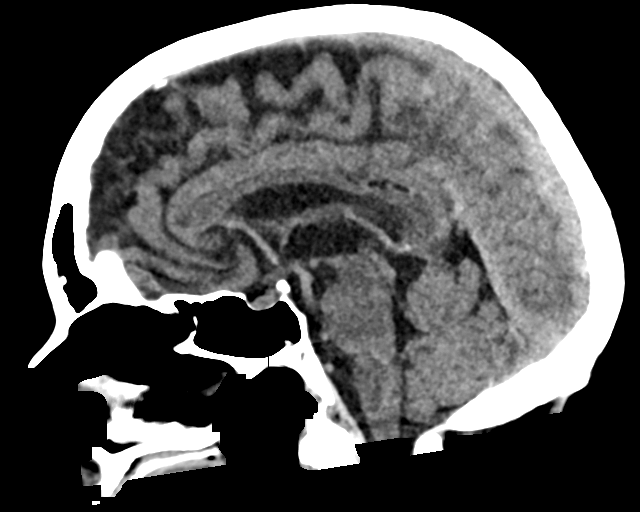
[im 41/61  brain]
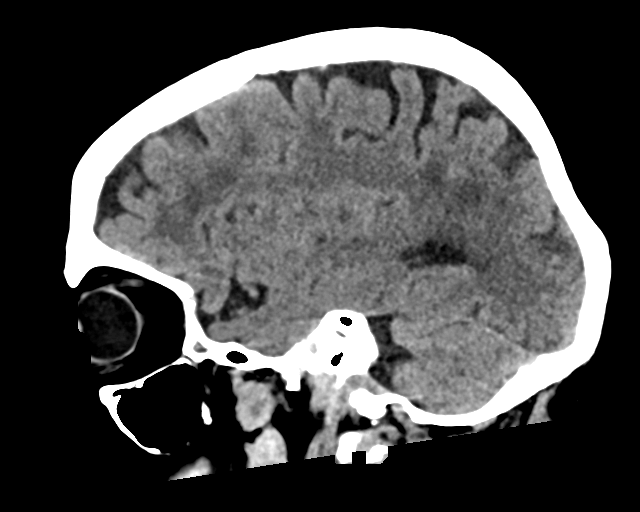

[15 of 47 positions shown; findings below may reference images not displayed]

FINDINGS: Brain: No midline shift, mass effect, or evidence of intracranial
mass lesion. No ventriculomegaly. No acute intracranial hemorrhage
identified.

Scattered bilateral hypodensity in the cerebral white matter, basal
ganglia. The thalami, brainstem and cerebellum appear spared. No
superimposed cortical encephalomalacia identified. No cortically
based acute infarct identified.

Vascular: Calcified atherosclerosis at the skull base. There is
mildly asymmetric increased density of the right MCA, best seen on
sagittal image 17.

Skull: No acute osseous abnormality identified.

Sinuses/Orbits: Well pneumatized aside from left ethmoid mucosal
thickening.

Other: No acute orbit or scalp soft tissue finding.

ASPECTS (Alberta Stroke Program Early CT Score)

Total score (0-10 with 10 being normal): 10
IMPRESSION: 1. Equivocal asymmetric increased density of the Right MCA, but no
acute cortically based infarct or acute intracranial hemorrhage
identified. ASPECTS 10.
2. Evidence of chronic small vessel disease.
3. These results were discussed Dr. Chai Tiger at [DATE]hours by
telephone.

## 2021-06-19 IMAGING — CT CT ABD-PELV W/O CM
2 of 4 series · 15 of 46 positions shown, 17 images · non-contrast
Comparison: None.

CLINICAL DATA: Abdominal distension. Labs concerning for possible
struvite stone.

EXAM:
CT ABDOMEN AND PELVIS WITHOUT CONTRAST
TECHNIQUE: Multidetector CT imaging of the abdomen and pelvis was performed
following the standard protocol without IV contrast.

[Series 3: a/p w/o 5mm · axial · non-contrast · 0.95mm/px · z∈[+1085,+1500]mm · 12 of 95 slices shown, 14 images]
[im 8/95  soft-tissue]
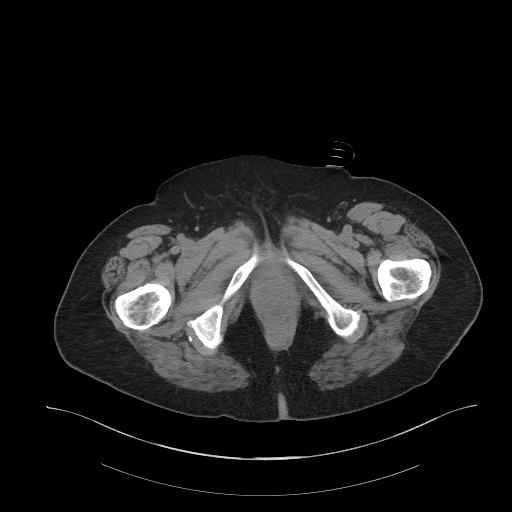
[im 8/95  bone]
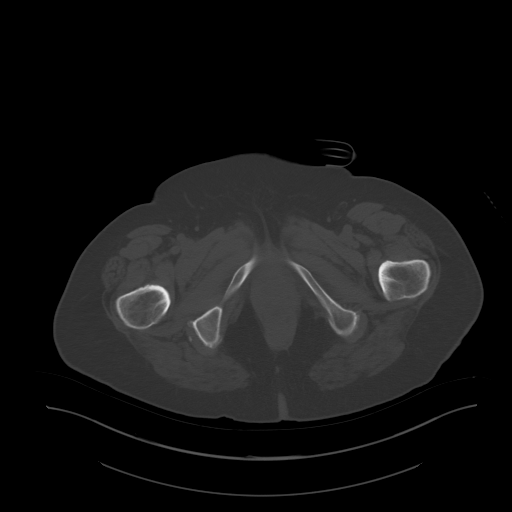
[im 16/95  soft-tissue]
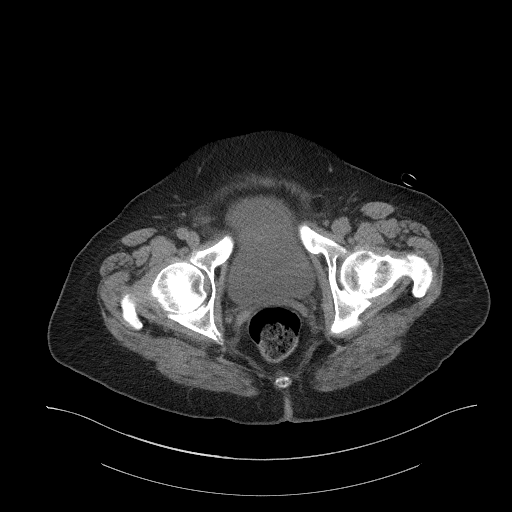
[im 23/95  soft-tissue]
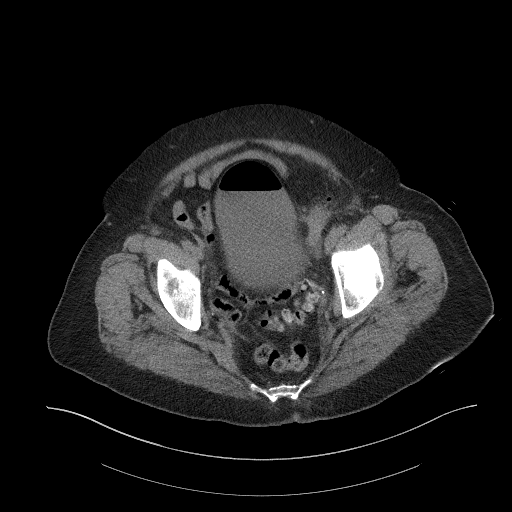
[im 31/95  soft-tissue]
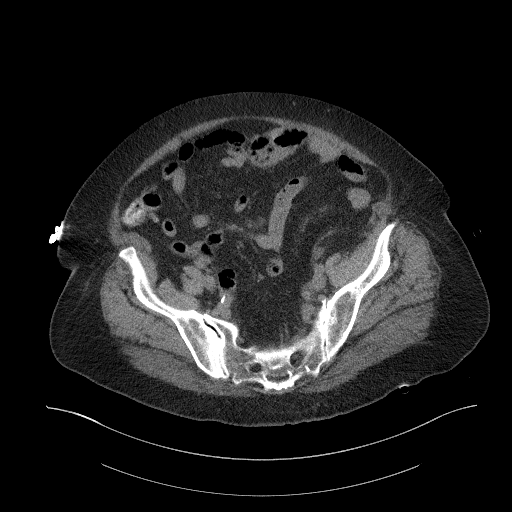
[im 38/95  soft-tissue]
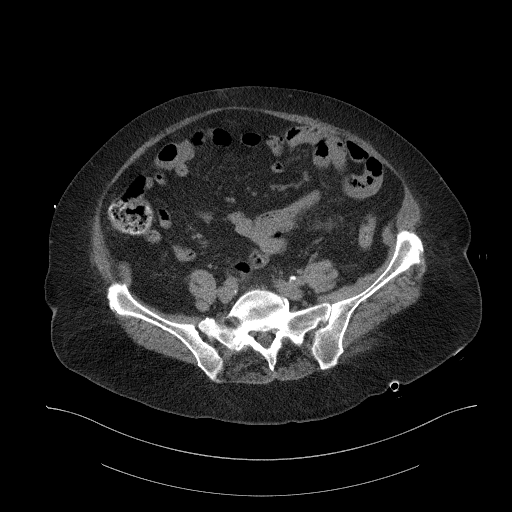
[im 46/95  soft-tissue]
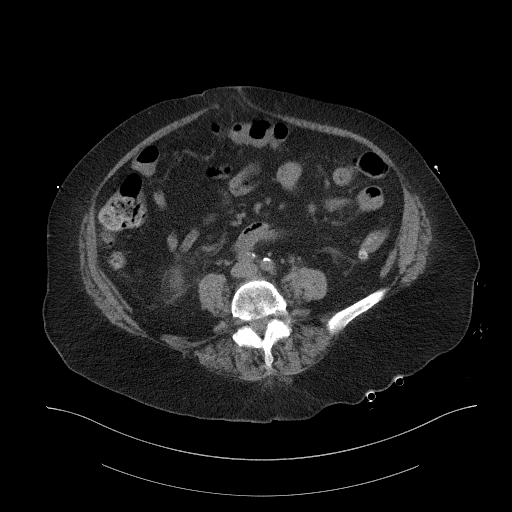
[im 53/95  soft-tissue]
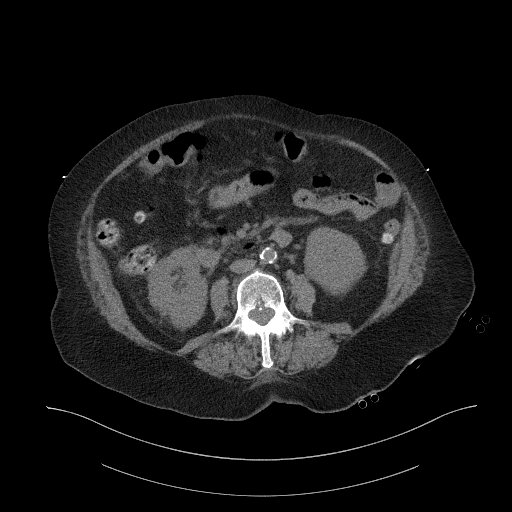
[im 61/95  soft-tissue]
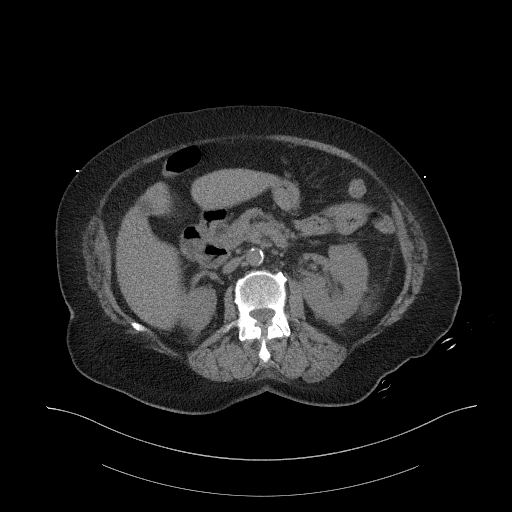
[im 68/95  soft-tissue]
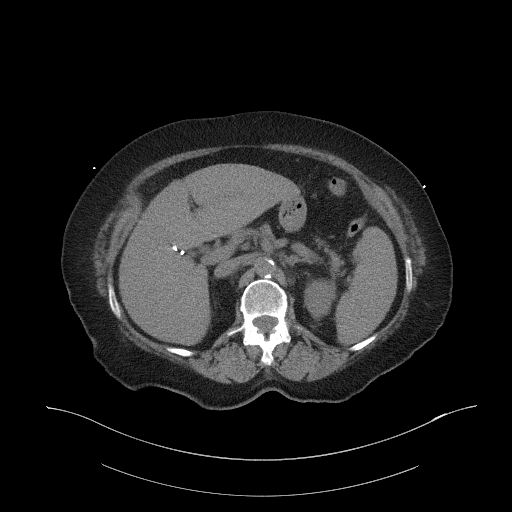
[im 68/95  bone]
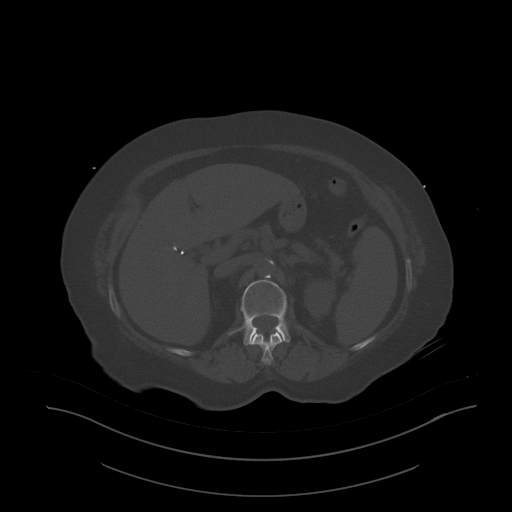
[im 76/95  soft-tissue]
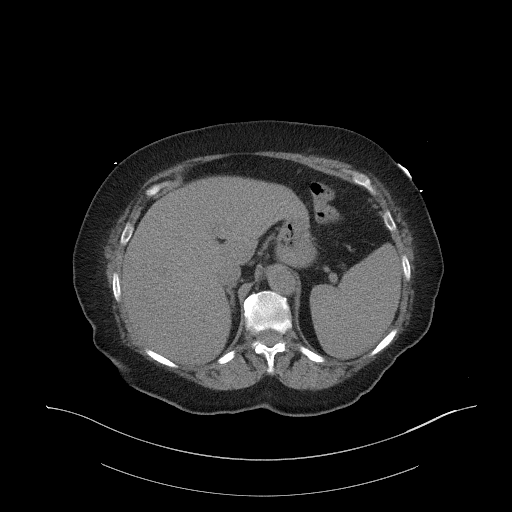
[im 83/95  soft-tissue]
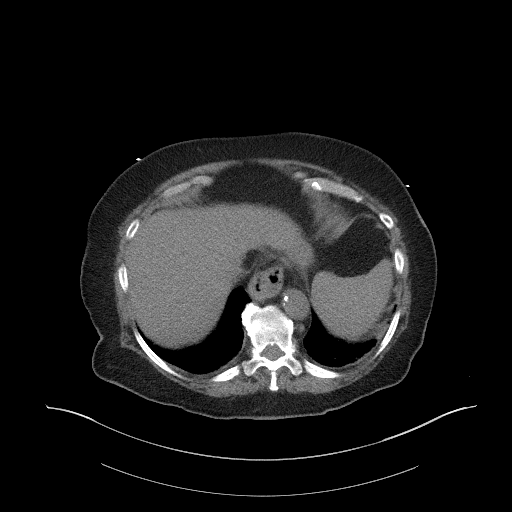
[im 91/95  soft-tissue]
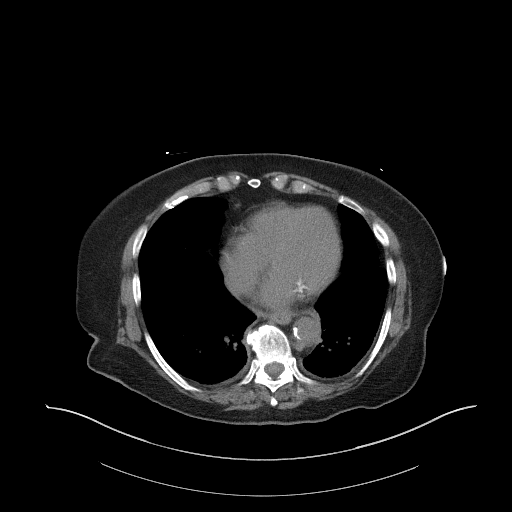

[Series 6: a/p w/o cor · coronal · non-contrast · 0.78mm/px · 3 of 151 slices shown]
[im 51/151  soft-tissue]
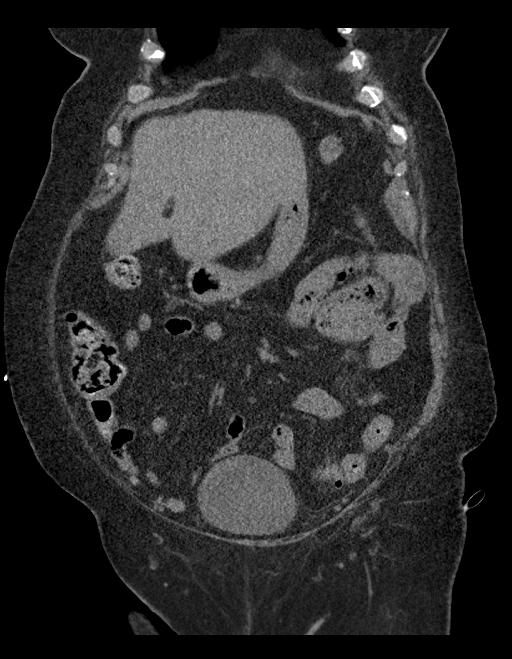
[im 67/151  soft-tissue]
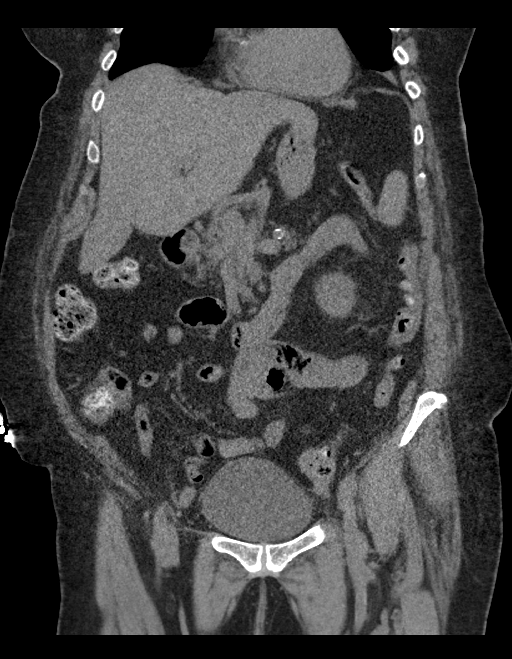
[im 84/151  soft-tissue]
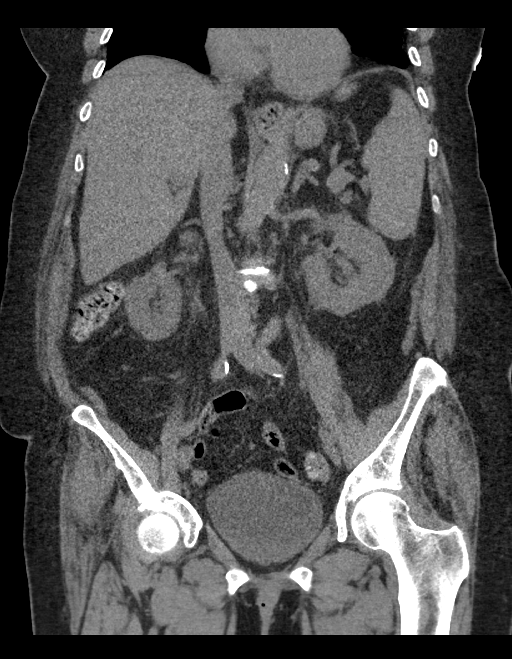

[15 of 46 positions shown; findings below may reference images not displayed]

FINDINGS: Lower chest: Mild dependent atelectasis with trace pleural
thickening. No significant effusion. There are coronary artery
calcifications. Upper normal heart size.

Hepatobiliary: Borderline hepatic steatosis. Left lobe enlargement
with questionable nodular hepatic contours. No discrete focal
hepatic lesion. There is mild focal fatty infiltration adjacent to
the gallbladder fossa. Clips in the gallbladder fossa
postcholecystectomy. No biliary dilatation.

Pancreas: Parenchymal atrophy. No ductal dilatation or inflammation.

Spleen: Mild splenomegaly with spleen spanning 13.3 cm cranial
caudal. No focal abnormality on noncontrast exam.

Adrenals/Urinary Tract: Normal right adrenal gland. Mild left
adrenal thickening without dominant nodule. Bilateral perinephric
edema. No hydronephrosis. Mild renal cortical scarring in the upper
and lower right kidney. There are no radiopaque calculi in the
kidneys or ureters. Ureters are decompressed. Urinary bladder is
distended with air-fluid level. No bladder wall thickening. No
bladder stone.

Stomach/Bowel: Small hiatal hernia. Stomach is nondistended. Normal
positioning of the duodenum and ligament of Treitz. There is no
small bowel obstruction or inflammation. Normal appendix. Multifocal
colonic diverticulosis, prominent in the sigmoid colon. Mild
pericolonic fat stranding involving the junction of the descending
and sigmoid colon, series 6, image 59, in the region of multiple
diverticula.

Vascular/Lymphatic: Aorto bi-iliac atherosclerosis increased number
of small retroperitoneal lymph nodes, nonspecific but likely
reactive.

Reproductive: Status post hysterectomy. No adnexal masses.

Other: No ascites and free fluid. No free air. No focal
intra-abdominal abscess. There is a small fat containing umbilical
hernia.

Musculoskeletal: Multilevel degenerative change in the spine. Mild
degenerative change of both hips. Bone island in the left
acetabulum. There are no acute or suspicious osseous abnormalities.
IMPRESSION: 1. No obstructive uropathy or radiopaque calculi.
2. Symmetric perinephric edema which is nonspecific. This may be
chronic or seen in the setting of urinary tract infection. Mild
right renal scarring.
3. Colonic diverticulosis with mild pericolonic fat stranding
involving the junction of the descending and sigmoid colon in the
region of multiple diverticula, suspicious for acute diverticulitis.
No perforation or abscess.
4. Borderline hepatic steatosis. Left lobe enlargement with
questionable nodular hepatic contours, can be seen with cirrhosis.
Recommend correlation with cirrhosis risk factors. Mild
splenomegaly.
5. Small hiatal hernia.
6. Small fat containing umbilical hernia.

Aortic Atherosclerosis ([id number]).

## 2021-06-28 IMAGING — DX DG CHEST 1V
1 series · 1 of 1 positions shown · non-contrast
Comparison: June 19, 2020

CLINICAL DATA: Difficulty breathing.  Acute coronary syndrome

EXAM:
CHEST  1 VIEW

[chest]
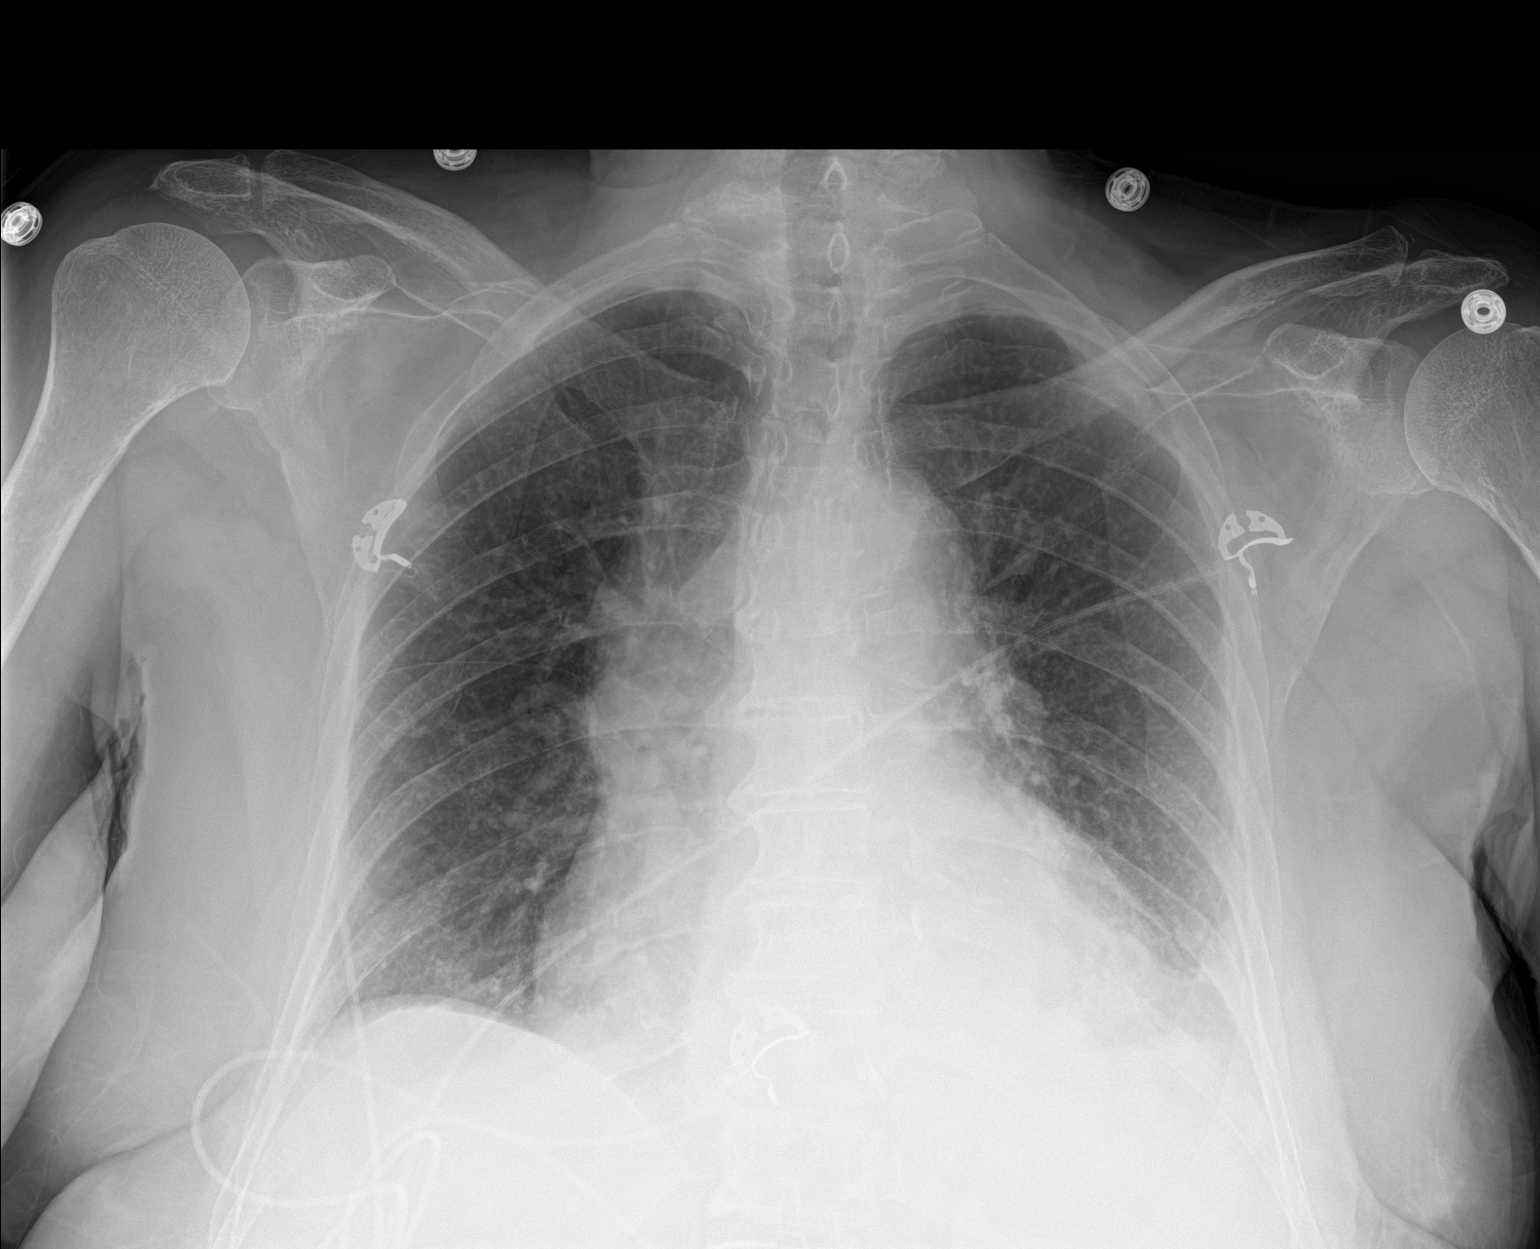

[1 of 1 positions shown; findings below may reference images not displayed]

FINDINGS: There is a small left pleural effusion with bibasilar atelectasis.
Lungs elsewhere clear. There is cardiomegaly with pulmonary
vascularity within normal limits. No adenopathy. No bone lesions.
IMPRESSION: Small left pleural effusion with bibasilar atelectasis. Lungs
elsewhere clear. Stable cardiomegaly.

## 2021-06-29 DIAGNOSIS — D044 Carcinoma in situ of skin of scalp and neck: Secondary | ICD-10-CM | POA: Diagnosis not present

## 2021-07-15 DIAGNOSIS — Z20822 Contact with and (suspected) exposure to covid-19: Secondary | ICD-10-CM | POA: Diagnosis not present

## 2021-07-15 DIAGNOSIS — Z7409 Other reduced mobility: Secondary | ICD-10-CM | POA: Diagnosis not present

## 2021-07-15 DIAGNOSIS — M1990 Unspecified osteoarthritis, unspecified site: Secondary | ICD-10-CM | POA: Diagnosis not present

## 2021-07-15 DIAGNOSIS — Z809 Family history of malignant neoplasm, unspecified: Secondary | ICD-10-CM | POA: Diagnosis not present

## 2021-07-15 DIAGNOSIS — G8314 Monoplegia of lower limb affecting left nondominant side: Secondary | ICD-10-CM | POA: Diagnosis not present

## 2021-07-15 DIAGNOSIS — Z823 Family history of stroke: Secondary | ICD-10-CM | POA: Diagnosis not present

## 2021-07-15 DIAGNOSIS — R609 Edema, unspecified: Secondary | ICD-10-CM | POA: Diagnosis not present

## 2021-07-15 DIAGNOSIS — Z7982 Long term (current) use of aspirin: Secondary | ICD-10-CM | POA: Diagnosis not present

## 2021-07-15 DIAGNOSIS — I10 Essential (primary) hypertension: Secondary | ICD-10-CM | POA: Diagnosis not present

## 2021-07-26 DIAGNOSIS — Z1331 Encounter for screening for depression: Secondary | ICD-10-CM | POA: Diagnosis not present

## 2021-07-26 DIAGNOSIS — E785 Hyperlipidemia, unspecified: Secondary | ICD-10-CM | POA: Diagnosis not present

## 2021-07-26 DIAGNOSIS — Z0000 Encounter for general adult medical examination without abnormal findings: Secondary | ICD-10-CM | POA: Diagnosis not present

## 2021-07-26 DIAGNOSIS — Z9181 History of falling: Secondary | ICD-10-CM | POA: Diagnosis not present

## 2021-08-02 DIAGNOSIS — Z23 Encounter for immunization: Secondary | ICD-10-CM | POA: Diagnosis not present

## 2021-08-09 DIAGNOSIS — I480 Paroxysmal atrial fibrillation: Secondary | ICD-10-CM | POA: Diagnosis not present

## 2021-08-09 DIAGNOSIS — I10 Essential (primary) hypertension: Secondary | ICD-10-CM | POA: Diagnosis not present

## 2021-08-09 DIAGNOSIS — R600 Localized edema: Secondary | ICD-10-CM | POA: Diagnosis not present

## 2021-08-09 DIAGNOSIS — E785 Hyperlipidemia, unspecified: Secondary | ICD-10-CM | POA: Diagnosis not present

## 2021-08-10 DIAGNOSIS — L570 Actinic keratosis: Secondary | ICD-10-CM | POA: Diagnosis not present

## 2021-08-17 DIAGNOSIS — Z20828 Contact with and (suspected) exposure to other viral communicable diseases: Secondary | ICD-10-CM | POA: Diagnosis not present

## 2021-09-02 DIAGNOSIS — B351 Tinea unguium: Secondary | ICD-10-CM | POA: Diagnosis not present

## 2021-09-08 DIAGNOSIS — H43813 Vitreous degeneration, bilateral: Secondary | ICD-10-CM | POA: Diagnosis not present

## 2021-09-08 DIAGNOSIS — G518 Other disorders of facial nerve: Secondary | ICD-10-CM | POA: Diagnosis not present

## 2021-09-08 DIAGNOSIS — H524 Presbyopia: Secondary | ICD-10-CM | POA: Diagnosis not present

## 2021-09-28 DIAGNOSIS — L209 Atopic dermatitis, unspecified: Secondary | ICD-10-CM | POA: Diagnosis not present

## 2021-09-28 DIAGNOSIS — D044 Carcinoma in situ of skin of scalp and neck: Secondary | ICD-10-CM | POA: Diagnosis not present

## 2021-11-09 DIAGNOSIS — R600 Localized edema: Secondary | ICD-10-CM | POA: Diagnosis not present

## 2021-11-09 DIAGNOSIS — E785 Hyperlipidemia, unspecified: Secondary | ICD-10-CM | POA: Diagnosis not present

## 2021-11-09 DIAGNOSIS — I480 Paroxysmal atrial fibrillation: Secondary | ICD-10-CM | POA: Diagnosis not present

## 2021-11-09 DIAGNOSIS — I10 Essential (primary) hypertension: Secondary | ICD-10-CM | POA: Diagnosis not present

## 2021-11-10 DIAGNOSIS — M1711 Unilateral primary osteoarthritis, right knee: Secondary | ICD-10-CM | POA: Diagnosis not present

## 2021-12-13 DIAGNOSIS — G518 Other disorders of facial nerve: Secondary | ICD-10-CM | POA: Diagnosis not present

## 2021-12-14 DIAGNOSIS — M1711 Unilateral primary osteoarthritis, right knee: Secondary | ICD-10-CM | POA: Diagnosis not present

## 2021-12-27 DIAGNOSIS — M25561 Pain in right knee: Secondary | ICD-10-CM | POA: Diagnosis not present

## 2021-12-28 DIAGNOSIS — M1711 Unilateral primary osteoarthritis, right knee: Secondary | ICD-10-CM | POA: Diagnosis not present

## 2022-01-18 DIAGNOSIS — M1711 Unilateral primary osteoarthritis, right knee: Secondary | ICD-10-CM | POA: Diagnosis not present

## 2022-01-18 DIAGNOSIS — M25561 Pain in right knee: Secondary | ICD-10-CM | POA: Diagnosis not present

## 2022-01-18 DIAGNOSIS — G8929 Other chronic pain: Secondary | ICD-10-CM | POA: Diagnosis not present

## 2022-01-18 DIAGNOSIS — S83281A Other tear of lateral meniscus, current injury, right knee, initial encounter: Secondary | ICD-10-CM | POA: Diagnosis not present

## 2022-01-18 DIAGNOSIS — S83241A Other tear of medial meniscus, current injury, right knee, initial encounter: Secondary | ICD-10-CM | POA: Diagnosis not present

## 2022-01-24 DIAGNOSIS — Z1231 Encounter for screening mammogram for malignant neoplasm of breast: Secondary | ICD-10-CM | POA: Diagnosis not present

## 2022-01-26 DIAGNOSIS — S83231A Complex tear of medial meniscus, current injury, right knee, initial encounter: Secondary | ICD-10-CM | POA: Diagnosis not present

## 2022-01-26 DIAGNOSIS — S83271A Complex tear of lateral meniscus, current injury, right knee, initial encounter: Secondary | ICD-10-CM | POA: Diagnosis not present

## 2022-01-26 DIAGNOSIS — G8918 Other acute postprocedural pain: Secondary | ICD-10-CM | POA: Diagnosis not present

## 2022-01-26 DIAGNOSIS — S83221A Peripheral tear of medial meniscus, current injury, right knee, initial encounter: Secondary | ICD-10-CM | POA: Diagnosis not present

## 2022-01-26 DIAGNOSIS — M2241 Chondromalacia patellae, right knee: Secondary | ICD-10-CM | POA: Diagnosis not present

## 2022-01-26 DIAGNOSIS — M65861 Other synovitis and tenosynovitis, right lower leg: Secondary | ICD-10-CM | POA: Diagnosis not present

## 2022-01-26 DIAGNOSIS — M659 Synovitis and tenosynovitis, unspecified: Secondary | ICD-10-CM | POA: Diagnosis not present

## 2022-01-26 DIAGNOSIS — M94261 Chondromalacia, right knee: Secondary | ICD-10-CM | POA: Diagnosis not present

## 2022-01-26 DIAGNOSIS — M6751 Plica syndrome, right knee: Secondary | ICD-10-CM | POA: Diagnosis not present

## 2022-01-26 DIAGNOSIS — S83261A Peripheral tear of lateral meniscus, current injury, right knee, initial encounter: Secondary | ICD-10-CM | POA: Diagnosis not present

## 2022-02-02 DIAGNOSIS — R2689 Other abnormalities of gait and mobility: Secondary | ICD-10-CM | POA: Diagnosis not present

## 2022-02-02 DIAGNOSIS — M25561 Pain in right knee: Secondary | ICD-10-CM | POA: Diagnosis not present

## 2022-02-02 DIAGNOSIS — M62551 Muscle wasting and atrophy, not elsewhere classified, right thigh: Secondary | ICD-10-CM | POA: Diagnosis not present

## 2022-02-02 DIAGNOSIS — M25461 Effusion, right knee: Secondary | ICD-10-CM | POA: Diagnosis not present

## 2022-02-04 DIAGNOSIS — M62551 Muscle wasting and atrophy, not elsewhere classified, right thigh: Secondary | ICD-10-CM | POA: Diagnosis not present

## 2022-02-04 DIAGNOSIS — M25461 Effusion, right knee: Secondary | ICD-10-CM | POA: Diagnosis not present

## 2022-02-04 DIAGNOSIS — R2689 Other abnormalities of gait and mobility: Secondary | ICD-10-CM | POA: Diagnosis not present

## 2022-02-04 DIAGNOSIS — M25561 Pain in right knee: Secondary | ICD-10-CM | POA: Diagnosis not present

## 2022-02-07 DIAGNOSIS — M25461 Effusion, right knee: Secondary | ICD-10-CM | POA: Diagnosis not present

## 2022-02-07 DIAGNOSIS — M62551 Muscle wasting and atrophy, not elsewhere classified, right thigh: Secondary | ICD-10-CM | POA: Diagnosis not present

## 2022-02-07 DIAGNOSIS — M25561 Pain in right knee: Secondary | ICD-10-CM | POA: Diagnosis not present

## 2022-02-07 DIAGNOSIS — R2689 Other abnormalities of gait and mobility: Secondary | ICD-10-CM | POA: Diagnosis not present

## 2022-02-09 DIAGNOSIS — I5032 Chronic diastolic (congestive) heart failure: Secondary | ICD-10-CM | POA: Diagnosis not present

## 2022-02-09 DIAGNOSIS — R600 Localized edema: Secondary | ICD-10-CM | POA: Diagnosis not present

## 2022-02-09 DIAGNOSIS — I10 Essential (primary) hypertension: Secondary | ICD-10-CM | POA: Diagnosis not present

## 2022-02-09 DIAGNOSIS — Z139 Encounter for screening, unspecified: Secondary | ICD-10-CM | POA: Diagnosis not present

## 2022-02-09 DIAGNOSIS — E785 Hyperlipidemia, unspecified: Secondary | ICD-10-CM | POA: Diagnosis not present

## 2022-02-09 DIAGNOSIS — I480 Paroxysmal atrial fibrillation: Secondary | ICD-10-CM | POA: Diagnosis not present

## 2022-02-10 DIAGNOSIS — M62551 Muscle wasting and atrophy, not elsewhere classified, right thigh: Secondary | ICD-10-CM | POA: Diagnosis not present

## 2022-02-10 DIAGNOSIS — M25461 Effusion, right knee: Secondary | ICD-10-CM | POA: Diagnosis not present

## 2022-02-10 DIAGNOSIS — R2689 Other abnormalities of gait and mobility: Secondary | ICD-10-CM | POA: Diagnosis not present

## 2022-02-10 DIAGNOSIS — M25561 Pain in right knee: Secondary | ICD-10-CM | POA: Diagnosis not present

## 2022-02-23 DIAGNOSIS — M25561 Pain in right knee: Secondary | ICD-10-CM | POA: Diagnosis not present

## 2022-02-23 DIAGNOSIS — M25461 Effusion, right knee: Secondary | ICD-10-CM | POA: Diagnosis not present

## 2022-02-23 DIAGNOSIS — R2689 Other abnormalities of gait and mobility: Secondary | ICD-10-CM | POA: Diagnosis not present

## 2022-02-23 DIAGNOSIS — M62551 Muscle wasting and atrophy, not elsewhere classified, right thigh: Secondary | ICD-10-CM | POA: Diagnosis not present

## 2022-03-08 DIAGNOSIS — D225 Melanocytic nevi of trunk: Secondary | ICD-10-CM | POA: Diagnosis not present

## 2022-03-08 DIAGNOSIS — L814 Other melanin hyperpigmentation: Secondary | ICD-10-CM | POA: Diagnosis not present

## 2022-03-08 DIAGNOSIS — D2239 Melanocytic nevi of other parts of face: Secondary | ICD-10-CM | POA: Diagnosis not present

## 2022-03-08 DIAGNOSIS — L821 Other seborrheic keratosis: Secondary | ICD-10-CM | POA: Diagnosis not present

## 2022-03-21 DIAGNOSIS — G518 Other disorders of facial nerve: Secondary | ICD-10-CM | POA: Diagnosis not present

## 2022-05-12 DIAGNOSIS — I480 Paroxysmal atrial fibrillation: Secondary | ICD-10-CM | POA: Diagnosis not present

## 2022-05-12 DIAGNOSIS — I5032 Chronic diastolic (congestive) heart failure: Secondary | ICD-10-CM | POA: Diagnosis not present

## 2022-05-12 DIAGNOSIS — R600 Localized edema: Secondary | ICD-10-CM | POA: Diagnosis not present

## 2022-05-12 DIAGNOSIS — I10 Essential (primary) hypertension: Secondary | ICD-10-CM | POA: Diagnosis not present

## 2022-05-12 DIAGNOSIS — E785 Hyperlipidemia, unspecified: Secondary | ICD-10-CM | POA: Diagnosis not present

## 2022-05-20 ENCOUNTER — Encounter: Payer: Self-pay | Admitting: Cardiology

## 2022-05-20 ENCOUNTER — Ambulatory Visit: Payer: Medicare PPO | Admitting: Cardiology

## 2022-05-20 VITALS — BP 148/64 | HR 69 | Ht 65.6 in | Wt 163.0 lb

## 2022-05-20 DIAGNOSIS — I10 Essential (primary) hypertension: Secondary | ICD-10-CM | POA: Diagnosis not present

## 2022-05-20 NOTE — Patient Instructions (Signed)

## 2022-05-20 NOTE — Progress Notes (Signed)
Cardiology Office Note:    Date:  05/20/2022   ID:  Alyssa Hudson, DOB [birth year], MRN [id number]  PCP:  Paulina Fusi, MD  Cardiologist:  Garwin Brothers, MD   Referring MD: Paulina Fusi, MD    ASSESSMENT:    1. Primary hypertension    PLAN:    In order of problems listed above:  Primary prevention stressed with the patient.  Importance of compliance with diet medication stressed and she vocalized understanding. Essential hypertension: Blood pressure stable and diet was emphasized.  Lifestyle modification urged.  She has an element of whitecoat hypertension.  Blood pressure is much better at home.  She was advised to walk at least half an hour a day 5 days a week and she promises to do so. History of pericarditis with no recurrence. Patient will be seen in follow-up appointment in 12 months or earlier if the patient has any concerns    Medication Adjustments/Labs and Tests Ordered: Current medicines are reviewed at length with the patient today.  Concerns regarding medicines are outlined above.  No orders of the defined types were placed in this encounter.  No orders of the defined types were placed in this encounter.    No chief complaint on file.    History of Present Illness:    Alyssa Hudson is a 81 y.o. female.  Patient has past medical history of essential hypertension and pericarditis in the past.  She denies any problems at this time and takes care of activities of daily living.  No chest pain orthopnea or PND.  At the time of my evaluation, the patient is alert awake oriented and in no distress.  Past Medical History:  Diagnosis Date   Acute diastolic heart failure (HCC) 06/30/2020   Acute pericarditis 07/24/2020   Aftercare following surgery 06/09/2016   Atrial fibrillation (HCC) 07/24/2020   BMI 33.0-33.9,adult    Carpal tunnel syndrome, right 08/13/2020   Cervical radiculopathy 08/13/2020   Cervical radiculopathy due to osteoarthritis of  spine    Diverticulosis    Encephalopathy 06/19/2020   Facial droop    Generalized osteoarthritis of multiple sites    Hypertension    Hyponatremia 06/20/2020   Primary hypertension 07/24/2020   Tricompartment osteoarthritis of right knee    Ulnar neuropathy at elbow, right 08/13/2020   Viral pericarditis with pericardial effusion 06/30/2020    Past Surgical History:  Procedure Laterality Date   CARPAL TUNNEL RELEASE Right 10/26/2020   CATARACT EXTRACTION  2003   insert prosthetic lens   CHOLECYSTECTOMY  01/1997   IR FLUORO GUIDED NEEDLE PLC ASPIRATION/INJECTION LOC  06/23/2020   RADIOLOGY WITH ANESTHESIA N/A 06/23/2020   Procedure: IR WITH ANESTHESIA;  Surgeon: Radiologist, Medication, MD;  Location: [facility] OR;  Service: Radiology;  Laterality: N/A;   TOTAL HYSTERECTOMY  02/1997    Current Medications: Current Meds  Medication Sig   amLODipine (NORVASC) 10 MG tablet Take 1 tablet (10 mg total) by mouth daily.   Biotin 5000 MCG TABS Take 5,000 mcg by mouth daily.   CALCIUM PO Take 600 mg by mouth daily.   CRANBERRY PO Take 650 mg by mouth daily.   finasteride (PROSCAR) 5 MG tablet Take 2.5 mg by mouth daily.   hydrochlorothiazide (MICROZIDE) 12.5 MG capsule Take 1 capsule (12.5 mg total) by mouth daily.   losartan (COZAAR) 100 MG tablet Take 100 mg by mouth daily.   MAGNESIUM PO Take 400 mg by mouth daily.   metoprolol succinate (TOPROL-XL) 100 MG  24 hr tablet Take 100 mg by mouth daily.   OVER THE COUNTER MEDICATION Take 4 capsules by mouth daily. MACULAR PROTECT   Probiotic Product (PROBIOTIC PO) Take 1 tablet by mouth daily.     Allergies:   Codeine, Contrast media [iodinated contrast media], Iodine-131, and Sulfa antibiotics   Social History   Socioeconomic History   Marital status: Married    Spouse name: Not on file   Number of children: 2   Years of education: Not on file   Highest education level: High school graduate  Occupational History   Not on file  Tobacco  Use   Smoking status: Never   Smokeless tobacco: Never  Substance and Sexual Activity   Alcohol use: Never   Drug use: Never   Sexual activity: Not on file  Other Topics Concern   Not on file  Social History Narrative   Lives at home with husband   Caffeine: none    Social Determinants of Health   Financial Resource Strain: Not on file  Food Insecurity: Not on file  Transportation Needs: Not on file  Physical Activity: Not on file  Stress: Not on file  Social Connections: Not on file     Family History: The patient's family history includes Alcohol abuse in her brother; Alzheimer's disease (age of onset: 69) in her sister; CVA in her mother; Hypertension in her father and mother; Muscular dystrophy in her brother; Prostate cancer (age of onset: 21) in her father; Stroke in her brother.  ROS:   Please see the history of present illness.    All other systems reviewed and are negative.  EKGs/Labs/Other Studies Reviewed:    The following studies were reviewed today: I discussed my findings with the patient at length EKG reveals sinus rhythm and nonspecific ST-T changes   Recent Labs: No results found for requested labs within last 365 days.  Recent Lipid Panel No results found for: "CHOL", "TRIG", "HDL", "CHOLHDL", "VLDL", "LDLCALC", "LDLDIRECT"  Physical Exam:    VS:  BP (!) 148/64 (BP Location: Left Arm, Patient Position: Sitting)   Pulse 69   Ht 5' 5.6" (1.666 m)   Wt 163 lb (73.9 kg)   SpO2 98%   BMI 26.63 kg/m     Wt Readings from Last 3 Encounters:  05/20/22 163 lb (73.9 kg)  05/25/21 166 lb (75.3 kg)  11/18/20 163 lb (73.9 kg)     GEN: Patient is in no acute distress HEENT: Normal NECK: No JVD; No carotid bruits LYMPHATICS: No lymphadenopathy CARDIAC: Hear sounds regular, 2/6 systolic murmur at the apex. RESPIRATORY:  Clear to auscultation without rales, wheezing or rhonchi  ABDOMEN: Soft, non-tender, non-distended MUSCULOSKELETAL:  No edema; No  deformity  SKIN: Warm and dry NEUROLOGIC:  Alert and oriented x 3 PSYCHIATRIC:  Normal affect   Signed, Garwin Brothers, MD  05/20/2022 3:23 PM    [facility]

## 2022-06-14 DIAGNOSIS — J069 Acute upper respiratory infection, unspecified: Secondary | ICD-10-CM | POA: Diagnosis not present

## 2022-06-20 DIAGNOSIS — G518 Other disorders of facial nerve: Secondary | ICD-10-CM | POA: Diagnosis not present

## 2022-06-24 DIAGNOSIS — M25551 Pain in right hip: Secondary | ICD-10-CM | POA: Diagnosis not present

## 2022-06-24 DIAGNOSIS — M25571 Pain in right ankle and joints of right foot: Secondary | ICD-10-CM | POA: Diagnosis not present

## 2022-07-05 DIAGNOSIS — M1611 Unilateral primary osteoarthritis, right hip: Secondary | ICD-10-CM | POA: Diagnosis not present

## 2022-07-05 DIAGNOSIS — M25551 Pain in right hip: Secondary | ICD-10-CM | POA: Diagnosis not present

## 2022-07-05 DIAGNOSIS — W19XXXA Unspecified fall, initial encounter: Secondary | ICD-10-CM | POA: Diagnosis not present

## 2022-07-11 DIAGNOSIS — M25551 Pain in right hip: Secondary | ICD-10-CM | POA: Diagnosis not present

## 2022-07-11 DIAGNOSIS — M25571 Pain in right ankle and joints of right foot: Secondary | ICD-10-CM | POA: Diagnosis not present

## 2022-07-11 DIAGNOSIS — M79651 Pain in right thigh: Secondary | ICD-10-CM | POA: Diagnosis not present

## 2022-07-11 DIAGNOSIS — M25561 Pain in right knee: Secondary | ICD-10-CM | POA: Diagnosis not present

## 2022-07-14 DIAGNOSIS — M25561 Pain in right knee: Secondary | ICD-10-CM | POA: Diagnosis not present

## 2022-07-14 DIAGNOSIS — M79651 Pain in right thigh: Secondary | ICD-10-CM | POA: Diagnosis not present

## 2022-07-14 DIAGNOSIS — M25571 Pain in right ankle and joints of right foot: Secondary | ICD-10-CM | POA: Diagnosis not present

## 2022-07-14 DIAGNOSIS — M25551 Pain in right hip: Secondary | ICD-10-CM | POA: Diagnosis not present

## 2022-07-19 DIAGNOSIS — M25551 Pain in right hip: Secondary | ICD-10-CM | POA: Diagnosis not present

## 2022-07-19 DIAGNOSIS — M25571 Pain in right ankle and joints of right foot: Secondary | ICD-10-CM | POA: Diagnosis not present

## 2022-07-19 DIAGNOSIS — M25561 Pain in right knee: Secondary | ICD-10-CM | POA: Diagnosis not present

## 2022-07-19 DIAGNOSIS — M79651 Pain in right thigh: Secondary | ICD-10-CM | POA: Diagnosis not present

## 2022-07-21 DIAGNOSIS — M25561 Pain in right knee: Secondary | ICD-10-CM | POA: Diagnosis not present

## 2022-07-21 DIAGNOSIS — M25551 Pain in right hip: Secondary | ICD-10-CM | POA: Diagnosis not present

## 2022-07-21 DIAGNOSIS — M79651 Pain in right thigh: Secondary | ICD-10-CM | POA: Diagnosis not present

## 2022-07-21 DIAGNOSIS — M25571 Pain in right ankle and joints of right foot: Secondary | ICD-10-CM | POA: Diagnosis not present

## 2022-08-01 DIAGNOSIS — S50312A Abrasion of left elbow, initial encounter: Secondary | ICD-10-CM | POA: Diagnosis not present

## 2022-08-01 DIAGNOSIS — W19XXXA Unspecified fall, initial encounter: Secondary | ICD-10-CM | POA: Diagnosis not present

## 2022-08-01 DIAGNOSIS — S0101XA Laceration without foreign body of scalp, initial encounter: Secondary | ICD-10-CM | POA: Diagnosis not present

## 2022-08-01 DIAGNOSIS — S52615A Nondisplaced fracture of left ulna styloid process, initial encounter for closed fracture: Secondary | ICD-10-CM | POA: Diagnosis not present

## 2022-08-03 DIAGNOSIS — S52615A Nondisplaced fracture of left ulna styloid process, initial encounter for closed fracture: Secondary | ICD-10-CM | POA: Diagnosis not present

## 2022-08-10 DIAGNOSIS — S52615A Nondisplaced fracture of left ulna styloid process, initial encounter for closed fracture: Secondary | ICD-10-CM | POA: Diagnosis not present

## 2022-08-12 DIAGNOSIS — I5032 Chronic diastolic (congestive) heart failure: Secondary | ICD-10-CM | POA: Diagnosis not present

## 2022-08-12 DIAGNOSIS — Z9181 History of falling: Secondary | ICD-10-CM | POA: Diagnosis not present

## 2022-08-12 DIAGNOSIS — Z23 Encounter for immunization: Secondary | ICD-10-CM | POA: Diagnosis not present

## 2022-08-12 DIAGNOSIS — E785 Hyperlipidemia, unspecified: Secondary | ICD-10-CM | POA: Diagnosis not present

## 2022-08-12 DIAGNOSIS — Z1331 Encounter for screening for depression: Secondary | ICD-10-CM | POA: Diagnosis not present

## 2022-08-12 DIAGNOSIS — R600 Localized edema: Secondary | ICD-10-CM | POA: Diagnosis not present

## 2022-08-12 DIAGNOSIS — I480 Paroxysmal atrial fibrillation: Secondary | ICD-10-CM | POA: Diagnosis not present

## 2022-08-12 DIAGNOSIS — I10 Essential (primary) hypertension: Secondary | ICD-10-CM | POA: Diagnosis not present

## 2022-08-16 DIAGNOSIS — S52615A Nondisplaced fracture of left ulna styloid process, initial encounter for closed fracture: Secondary | ICD-10-CM | POA: Diagnosis not present

## 2022-08-19 DIAGNOSIS — J208 Acute bronchitis due to other specified organisms: Secondary | ICD-10-CM | POA: Diagnosis not present

## 2022-09-14 DIAGNOSIS — S52615A Nondisplaced fracture of left ulna styloid process, initial encounter for closed fracture: Secondary | ICD-10-CM | POA: Diagnosis not present

## 2022-09-22 DIAGNOSIS — J019 Acute sinusitis, unspecified: Secondary | ICD-10-CM | POA: Diagnosis not present

## 2022-10-03 DIAGNOSIS — G518 Other disorders of facial nerve: Secondary | ICD-10-CM | POA: Diagnosis not present

## 2022-10-05 DIAGNOSIS — H353131 Nonexudative age-related macular degeneration, bilateral, early dry stage: Secondary | ICD-10-CM | POA: Diagnosis not present

## 2022-10-12 DIAGNOSIS — S52615A Nondisplaced fracture of left ulna styloid process, initial encounter for closed fracture: Secondary | ICD-10-CM | POA: Diagnosis not present

## 2022-10-12 DIAGNOSIS — M20012 Mallet finger of left finger(s): Secondary | ICD-10-CM | POA: Diagnosis not present

## 2022-10-25 DIAGNOSIS — D485 Neoplasm of uncertain behavior of skin: Secondary | ICD-10-CM | POA: Diagnosis not present

## 2022-11-18 DIAGNOSIS — E785 Hyperlipidemia, unspecified: Secondary | ICD-10-CM | POA: Diagnosis not present

## 2022-11-18 DIAGNOSIS — I5032 Chronic diastolic (congestive) heart failure: Secondary | ICD-10-CM | POA: Diagnosis not present

## 2022-11-18 DIAGNOSIS — R600 Localized edema: Secondary | ICD-10-CM | POA: Diagnosis not present

## 2022-11-18 DIAGNOSIS — I480 Paroxysmal atrial fibrillation: Secondary | ICD-10-CM | POA: Diagnosis not present

## 2022-11-18 DIAGNOSIS — I10 Essential (primary) hypertension: Secondary | ICD-10-CM | POA: Diagnosis not present

## 2022-11-18 DIAGNOSIS — G4762 Sleep related leg cramps: Secondary | ICD-10-CM | POA: Diagnosis not present

## 2022-12-07 DIAGNOSIS — M20012 Mallet finger of left finger(s): Secondary | ICD-10-CM | POA: Diagnosis not present

## 2022-12-20 DIAGNOSIS — L570 Actinic keratosis: Secondary | ICD-10-CM | POA: Diagnosis not present

## 2023-01-02 DIAGNOSIS — G518 Other disorders of facial nerve: Secondary | ICD-10-CM | POA: Diagnosis not present

## 2023-02-09 DIAGNOSIS — Z1231 Encounter for screening mammogram for malignant neoplasm of breast: Secondary | ICD-10-CM | POA: Diagnosis not present

## 2023-02-22 DIAGNOSIS — G4762 Sleep related leg cramps: Secondary | ICD-10-CM | POA: Diagnosis not present

## 2023-02-22 DIAGNOSIS — E785 Hyperlipidemia, unspecified: Secondary | ICD-10-CM | POA: Diagnosis not present

## 2023-02-22 DIAGNOSIS — I480 Paroxysmal atrial fibrillation: Secondary | ICD-10-CM | POA: Diagnosis not present

## 2023-02-22 DIAGNOSIS — I5032 Chronic diastolic (congestive) heart failure: Secondary | ICD-10-CM | POA: Diagnosis not present

## 2023-02-22 DIAGNOSIS — I10 Essential (primary) hypertension: Secondary | ICD-10-CM | POA: Diagnosis not present

## 2023-02-22 DIAGNOSIS — R600 Localized edema: Secondary | ICD-10-CM | POA: Diagnosis not present

## 2023-02-22 DIAGNOSIS — Z139 Encounter for screening, unspecified: Secondary | ICD-10-CM | POA: Diagnosis not present

## 2023-03-03 DIAGNOSIS — R053 Chronic cough: Secondary | ICD-10-CM | POA: Diagnosis not present

## 2023-03-03 DIAGNOSIS — R059 Cough, unspecified: Secondary | ICD-10-CM | POA: Diagnosis not present

## 2023-03-08 DIAGNOSIS — J208 Acute bronchitis due to other specified organisms: Secondary | ICD-10-CM | POA: Diagnosis not present

## 2023-04-03 DIAGNOSIS — G518 Other disorders of facial nerve: Secondary | ICD-10-CM | POA: Diagnosis not present

## 2023-04-04 DIAGNOSIS — L609 Nail disorder, unspecified: Secondary | ICD-10-CM | POA: Diagnosis not present

## 2023-04-13 DIAGNOSIS — L570 Actinic keratosis: Secondary | ICD-10-CM | POA: Diagnosis not present

## 2023-04-13 DIAGNOSIS — D485 Neoplasm of uncertain behavior of skin: Secondary | ICD-10-CM | POA: Diagnosis not present

## 2023-04-13 DIAGNOSIS — L649 Androgenic alopecia, unspecified: Secondary | ICD-10-CM | POA: Diagnosis not present

## 2023-04-13 DIAGNOSIS — D044 Carcinoma in situ of skin of scalp and neck: Secondary | ICD-10-CM | POA: Diagnosis not present

## 2023-04-13 DIAGNOSIS — R233 Spontaneous ecchymoses: Secondary | ICD-10-CM | POA: Diagnosis not present

## 2023-05-01 DIAGNOSIS — H1032 Unspecified acute conjunctivitis, left eye: Secondary | ICD-10-CM | POA: Diagnosis not present

## 2023-06-02 DIAGNOSIS — I5032 Chronic diastolic (congestive) heart failure: Secondary | ICD-10-CM | POA: Diagnosis not present

## 2023-06-02 DIAGNOSIS — I129 Hypertensive chronic kidney disease with stage 1 through stage 4 chronic kidney disease, or unspecified chronic kidney disease: Secondary | ICD-10-CM | POA: Diagnosis not present

## 2023-06-02 DIAGNOSIS — L659 Nonscarring hair loss, unspecified: Secondary | ICD-10-CM | POA: Diagnosis not present

## 2023-06-02 DIAGNOSIS — R600 Localized edema: Secondary | ICD-10-CM | POA: Diagnosis not present

## 2023-06-02 DIAGNOSIS — E785 Hyperlipidemia, unspecified: Secondary | ICD-10-CM | POA: Diagnosis not present

## 2023-06-02 DIAGNOSIS — N183 Chronic kidney disease, stage 3 unspecified: Secondary | ICD-10-CM | POA: Diagnosis not present

## 2023-06-02 DIAGNOSIS — Z6827 Body mass index (BMI) 27.0-27.9, adult: Secondary | ICD-10-CM | POA: Diagnosis not present

## 2023-06-02 DIAGNOSIS — I480 Paroxysmal atrial fibrillation: Secondary | ICD-10-CM | POA: Diagnosis not present

## 2023-06-02 DIAGNOSIS — G4762 Sleep related leg cramps: Secondary | ICD-10-CM | POA: Diagnosis not present

## 2023-06-07 DIAGNOSIS — L570 Actinic keratosis: Secondary | ICD-10-CM | POA: Diagnosis not present

## 2023-06-07 DIAGNOSIS — H353131 Nonexudative age-related macular degeneration, bilateral, early dry stage: Secondary | ICD-10-CM | POA: Diagnosis not present

## 2023-06-07 DIAGNOSIS — H04123 Dry eye syndrome of bilateral lacrimal glands: Secondary | ICD-10-CM | POA: Diagnosis not present

## 2023-06-24 DIAGNOSIS — L03313 Cellulitis of chest wall: Secondary | ICD-10-CM | POA: Diagnosis not present

## 2023-06-24 DIAGNOSIS — T8131XA Disruption of external operation (surgical) wound, not elsewhere classified, initial encounter: Secondary | ICD-10-CM | POA: Diagnosis not present

## 2023-06-24 DIAGNOSIS — R911 Solitary pulmonary nodule: Secondary | ICD-10-CM | POA: Diagnosis not present

## 2023-06-24 DIAGNOSIS — I5033 Acute on chronic diastolic (congestive) heart failure: Secondary | ICD-10-CM | POA: Diagnosis not present

## 2023-06-24 DIAGNOSIS — U071 COVID-19: Secondary | ICD-10-CM | POA: Diagnosis not present

## 2023-06-24 DIAGNOSIS — N179 Acute kidney failure, unspecified: Secondary | ICD-10-CM | POA: Diagnosis not present

## 2023-06-24 DIAGNOSIS — R161 Splenomegaly, not elsewhere classified: Secondary | ICD-10-CM | POA: Diagnosis not present

## 2023-06-24 DIAGNOSIS — L02412 Cutaneous abscess of left axilla: Secondary | ICD-10-CM | POA: Diagnosis not present

## 2023-06-24 DIAGNOSIS — E871 Hypo-osmolality and hyponatremia: Secondary | ICD-10-CM | POA: Diagnosis not present

## 2023-06-24 DIAGNOSIS — K651 Peritoneal abscess: Secondary | ICD-10-CM | POA: Diagnosis not present

## 2023-06-24 DIAGNOSIS — A419 Sepsis, unspecified organism: Secondary | ICD-10-CM | POA: Diagnosis not present

## 2023-06-24 DIAGNOSIS — L02411 Cutaneous abscess of right axilla: Secondary | ICD-10-CM | POA: Diagnosis not present

## 2023-06-24 DIAGNOSIS — R229 Localized swelling, mass and lump, unspecified: Secondary | ICD-10-CM | POA: Diagnosis not present

## 2023-06-25 DIAGNOSIS — D72829 Elevated white blood cell count, unspecified: Secondary | ICD-10-CM | POA: Diagnosis not present

## 2023-06-25 DIAGNOSIS — A419 Sepsis, unspecified organism: Secondary | ICD-10-CM | POA: Diagnosis not present

## 2023-06-25 DIAGNOSIS — L02411 Cutaneous abscess of right axilla: Secondary | ICD-10-CM | POA: Diagnosis not present

## 2023-06-25 DIAGNOSIS — D696 Thrombocytopenia, unspecified: Secondary | ICD-10-CM | POA: Diagnosis not present

## 2023-06-26 DIAGNOSIS — L02412 Cutaneous abscess of left axilla: Secondary | ICD-10-CM | POA: Diagnosis not present

## 2023-06-26 DIAGNOSIS — D696 Thrombocytopenia, unspecified: Secondary | ICD-10-CM | POA: Diagnosis not present

## 2023-06-26 DIAGNOSIS — R918 Other nonspecific abnormal finding of lung field: Secondary | ICD-10-CM | POA: Diagnosis not present

## 2023-06-26 DIAGNOSIS — L02411 Cutaneous abscess of right axilla: Secondary | ICD-10-CM | POA: Diagnosis not present

## 2023-06-26 DIAGNOSIS — L03313 Cellulitis of chest wall: Secondary | ICD-10-CM | POA: Diagnosis not present

## 2023-06-26 DIAGNOSIS — R911 Solitary pulmonary nodule: Secondary | ICD-10-CM | POA: Diagnosis not present

## 2023-06-26 DIAGNOSIS — R591 Generalized enlarged lymph nodes: Secondary | ICD-10-CM | POA: Diagnosis not present

## 2023-06-26 DIAGNOSIS — E871 Hypo-osmolality and hyponatremia: Secondary | ICD-10-CM | POA: Diagnosis not present

## 2023-06-26 DIAGNOSIS — L089 Local infection of the skin and subcutaneous tissue, unspecified: Secondary | ICD-10-CM | POA: Diagnosis not present

## 2023-06-26 DIAGNOSIS — T8131XA Disruption of external operation (surgical) wound, not elsewhere classified, initial encounter: Secondary | ICD-10-CM | POA: Diagnosis not present

## 2023-06-26 DIAGNOSIS — Z79899 Other long term (current) drug therapy: Secondary | ICD-10-CM | POA: Diagnosis not present

## 2023-06-26 DIAGNOSIS — R0600 Dyspnea, unspecified: Secondary | ICD-10-CM | POA: Diagnosis not present

## 2023-06-26 DIAGNOSIS — M7989 Other specified soft tissue disorders: Secondary | ICD-10-CM | POA: Diagnosis not present

## 2023-06-26 DIAGNOSIS — A419 Sepsis, unspecified organism: Secondary | ICD-10-CM | POA: Diagnosis not present

## 2023-06-26 DIAGNOSIS — I480 Paroxysmal atrial fibrillation: Secondary | ICD-10-CM | POA: Diagnosis not present

## 2023-06-26 DIAGNOSIS — U071 COVID-19: Secondary | ICD-10-CM | POA: Diagnosis not present

## 2023-06-26 DIAGNOSIS — J90 Pleural effusion, not elsewhere classified: Secondary | ICD-10-CM | POA: Diagnosis not present

## 2023-06-26 DIAGNOSIS — I5033 Acute on chronic diastolic (congestive) heart failure: Secondary | ICD-10-CM | POA: Diagnosis not present

## 2023-06-26 DIAGNOSIS — N179 Acute kidney failure, unspecified: Secondary | ICD-10-CM | POA: Diagnosis not present

## 2023-06-26 DIAGNOSIS — I10 Essential (primary) hypertension: Secondary | ICD-10-CM | POA: Diagnosis not present

## 2023-06-26 DIAGNOSIS — D72829 Elevated white blood cell count, unspecified: Secondary | ICD-10-CM | POA: Diagnosis not present

## 2023-06-26 DIAGNOSIS — D649 Anemia, unspecified: Secondary | ICD-10-CM | POA: Diagnosis not present

## 2023-07-04 DIAGNOSIS — Z4800 Encounter for change or removal of nonsurgical wound dressing: Secondary | ICD-10-CM | POA: Diagnosis not present

## 2023-07-04 DIAGNOSIS — D696 Thrombocytopenia, unspecified: Secondary | ICD-10-CM | POA: Diagnosis not present

## 2023-07-04 DIAGNOSIS — I4891 Unspecified atrial fibrillation: Secondary | ICD-10-CM | POA: Diagnosis not present

## 2023-07-04 DIAGNOSIS — N179 Acute kidney failure, unspecified: Secondary | ICD-10-CM | POA: Diagnosis not present

## 2023-07-04 DIAGNOSIS — Z4803 Encounter for change or removal of drains: Secondary | ICD-10-CM | POA: Diagnosis not present

## 2023-07-04 DIAGNOSIS — I110 Hypertensive heart disease with heart failure: Secondary | ICD-10-CM | POA: Diagnosis not present

## 2023-07-04 DIAGNOSIS — L02412 Cutaneous abscess of left axilla: Secondary | ICD-10-CM | POA: Diagnosis not present

## 2023-07-04 DIAGNOSIS — I5030 Unspecified diastolic (congestive) heart failure: Secondary | ICD-10-CM | POA: Diagnosis not present

## 2023-07-04 DIAGNOSIS — L03112 Cellulitis of left axilla: Secondary | ICD-10-CM | POA: Diagnosis not present

## 2023-07-06 DIAGNOSIS — I5031 Acute diastolic (congestive) heart failure: Secondary | ICD-10-CM | POA: Diagnosis not present

## 2023-07-06 DIAGNOSIS — L02419 Cutaneous abscess of limb, unspecified: Secondary | ICD-10-CM | POA: Diagnosis not present

## 2023-07-06 DIAGNOSIS — L03313 Cellulitis of chest wall: Secondary | ICD-10-CM | POA: Diagnosis not present

## 2023-07-06 DIAGNOSIS — R911 Solitary pulmonary nodule: Secondary | ICD-10-CM | POA: Diagnosis not present

## 2023-07-06 DIAGNOSIS — N179 Acute kidney failure, unspecified: Secondary | ICD-10-CM | POA: Diagnosis not present

## 2023-07-06 DIAGNOSIS — I129 Hypertensive chronic kidney disease with stage 1 through stage 4 chronic kidney disease, or unspecified chronic kidney disease: Secondary | ICD-10-CM | POA: Diagnosis not present

## 2023-07-06 DIAGNOSIS — N183 Chronic kidney disease, stage 3 unspecified: Secondary | ICD-10-CM | POA: Diagnosis not present

## 2023-07-06 DIAGNOSIS — U071 COVID-19: Secondary | ICD-10-CM | POA: Diagnosis not present

## 2023-07-06 DIAGNOSIS — A419 Sepsis, unspecified organism: Secondary | ICD-10-CM | POA: Diagnosis not present

## 2023-07-07 DIAGNOSIS — N179 Acute kidney failure, unspecified: Secondary | ICD-10-CM | POA: Diagnosis not present

## 2023-07-07 DIAGNOSIS — I110 Hypertensive heart disease with heart failure: Secondary | ICD-10-CM | POA: Diagnosis not present

## 2023-07-07 DIAGNOSIS — D696 Thrombocytopenia, unspecified: Secondary | ICD-10-CM | POA: Diagnosis not present

## 2023-07-07 DIAGNOSIS — Z4800 Encounter for change or removal of nonsurgical wound dressing: Secondary | ICD-10-CM | POA: Diagnosis not present

## 2023-07-07 DIAGNOSIS — L03112 Cellulitis of left axilla: Secondary | ICD-10-CM | POA: Diagnosis not present

## 2023-07-07 DIAGNOSIS — Z4803 Encounter for change or removal of drains: Secondary | ICD-10-CM | POA: Diagnosis not present

## 2023-07-07 DIAGNOSIS — I5030 Unspecified diastolic (congestive) heart failure: Secondary | ICD-10-CM | POA: Diagnosis not present

## 2023-07-07 DIAGNOSIS — I4891 Unspecified atrial fibrillation: Secondary | ICD-10-CM | POA: Diagnosis not present

## 2023-07-07 DIAGNOSIS — L02412 Cutaneous abscess of left axilla: Secondary | ICD-10-CM | POA: Diagnosis not present

## 2023-07-11 DIAGNOSIS — N179 Acute kidney failure, unspecified: Secondary | ICD-10-CM | POA: Diagnosis not present

## 2023-07-11 DIAGNOSIS — D696 Thrombocytopenia, unspecified: Secondary | ICD-10-CM | POA: Diagnosis not present

## 2023-07-11 DIAGNOSIS — I110 Hypertensive heart disease with heart failure: Secondary | ICD-10-CM | POA: Diagnosis not present

## 2023-07-11 DIAGNOSIS — I4891 Unspecified atrial fibrillation: Secondary | ICD-10-CM | POA: Diagnosis not present

## 2023-07-11 DIAGNOSIS — L02412 Cutaneous abscess of left axilla: Secondary | ICD-10-CM | POA: Diagnosis not present

## 2023-07-11 DIAGNOSIS — I5030 Unspecified diastolic (congestive) heart failure: Secondary | ICD-10-CM | POA: Diagnosis not present

## 2023-07-11 DIAGNOSIS — L03112 Cellulitis of left axilla: Secondary | ICD-10-CM | POA: Diagnosis not present

## 2023-07-11 DIAGNOSIS — Z4803 Encounter for change or removal of drains: Secondary | ICD-10-CM | POA: Diagnosis not present

## 2023-07-11 DIAGNOSIS — Z4800 Encounter for change or removal of nonsurgical wound dressing: Secondary | ICD-10-CM | POA: Diagnosis not present

## 2023-07-11 DIAGNOSIS — M7989 Other specified soft tissue disorders: Secondary | ICD-10-CM | POA: Diagnosis not present

## 2023-07-12 DIAGNOSIS — L02412 Cutaneous abscess of left axilla: Secondary | ICD-10-CM | POA: Diagnosis not present

## 2023-07-14 DIAGNOSIS — I110 Hypertensive heart disease with heart failure: Secondary | ICD-10-CM | POA: Diagnosis not present

## 2023-07-14 DIAGNOSIS — D696 Thrombocytopenia, unspecified: Secondary | ICD-10-CM | POA: Diagnosis not present

## 2023-07-14 DIAGNOSIS — L02412 Cutaneous abscess of left axilla: Secondary | ICD-10-CM | POA: Diagnosis not present

## 2023-07-14 DIAGNOSIS — Z4803 Encounter for change or removal of drains: Secondary | ICD-10-CM | POA: Diagnosis not present

## 2023-07-14 DIAGNOSIS — L03112 Cellulitis of left axilla: Secondary | ICD-10-CM | POA: Diagnosis not present

## 2023-07-14 DIAGNOSIS — I4891 Unspecified atrial fibrillation: Secondary | ICD-10-CM | POA: Diagnosis not present

## 2023-07-14 DIAGNOSIS — I5030 Unspecified diastolic (congestive) heart failure: Secondary | ICD-10-CM | POA: Diagnosis not present

## 2023-07-14 DIAGNOSIS — Z4800 Encounter for change or removal of nonsurgical wound dressing: Secondary | ICD-10-CM | POA: Diagnosis not present

## 2023-07-14 DIAGNOSIS — N179 Acute kidney failure, unspecified: Secondary | ICD-10-CM | POA: Diagnosis not present

## 2023-07-19 DIAGNOSIS — R918 Other nonspecific abnormal finding of lung field: Secondary | ICD-10-CM | POA: Diagnosis not present

## 2023-07-19 DIAGNOSIS — Z8616 Personal history of COVID-19: Secondary | ICD-10-CM | POA: Diagnosis not present

## 2023-07-19 DIAGNOSIS — R911 Solitary pulmonary nodule: Secondary | ICD-10-CM | POA: Diagnosis not present

## 2023-07-20 DIAGNOSIS — I110 Hypertensive heart disease with heart failure: Secondary | ICD-10-CM | POA: Diagnosis not present

## 2023-07-20 DIAGNOSIS — Z4800 Encounter for change or removal of nonsurgical wound dressing: Secondary | ICD-10-CM | POA: Diagnosis not present

## 2023-07-20 DIAGNOSIS — I5030 Unspecified diastolic (congestive) heart failure: Secondary | ICD-10-CM | POA: Diagnosis not present

## 2023-07-20 DIAGNOSIS — L02412 Cutaneous abscess of left axilla: Secondary | ICD-10-CM | POA: Diagnosis not present

## 2023-07-20 DIAGNOSIS — Z4803 Encounter for change or removal of drains: Secondary | ICD-10-CM | POA: Diagnosis not present

## 2023-07-20 DIAGNOSIS — N179 Acute kidney failure, unspecified: Secondary | ICD-10-CM | POA: Diagnosis not present

## 2023-07-20 DIAGNOSIS — D696 Thrombocytopenia, unspecified: Secondary | ICD-10-CM | POA: Diagnosis not present

## 2023-07-20 DIAGNOSIS — L03112 Cellulitis of left axilla: Secondary | ICD-10-CM | POA: Diagnosis not present

## 2023-07-20 DIAGNOSIS — I4891 Unspecified atrial fibrillation: Secondary | ICD-10-CM | POA: Diagnosis not present

## 2023-07-25 DIAGNOSIS — L570 Actinic keratosis: Secondary | ICD-10-CM | POA: Diagnosis not present

## 2023-07-26 DIAGNOSIS — D696 Thrombocytopenia, unspecified: Secondary | ICD-10-CM | POA: Diagnosis not present

## 2023-07-26 DIAGNOSIS — Z91041 Radiographic dye allergy status: Secondary | ICD-10-CM | POA: Diagnosis not present

## 2023-07-26 DIAGNOSIS — Z79899 Other long term (current) drug therapy: Secondary | ICD-10-CM | POA: Diagnosis not present

## 2023-07-26 DIAGNOSIS — I110 Hypertensive heart disease with heart failure: Secondary | ICD-10-CM | POA: Diagnosis not present

## 2023-07-26 DIAGNOSIS — Z885 Allergy status to narcotic agent status: Secondary | ICD-10-CM | POA: Diagnosis not present

## 2023-07-26 DIAGNOSIS — Z4800 Encounter for change or removal of nonsurgical wound dressing: Secondary | ICD-10-CM | POA: Diagnosis not present

## 2023-07-26 DIAGNOSIS — Z888 Allergy status to other drugs, medicaments and biological substances status: Secondary | ICD-10-CM | POA: Diagnosis not present

## 2023-07-26 DIAGNOSIS — I10 Essential (primary) hypertension: Secondary | ICD-10-CM | POA: Diagnosis not present

## 2023-07-26 DIAGNOSIS — I5030 Unspecified diastolic (congestive) heart failure: Secondary | ICD-10-CM | POA: Diagnosis not present

## 2023-07-26 DIAGNOSIS — M79622 Pain in left upper arm: Secondary | ICD-10-CM | POA: Diagnosis not present

## 2023-07-26 DIAGNOSIS — I4891 Unspecified atrial fibrillation: Secondary | ICD-10-CM | POA: Diagnosis not present

## 2023-07-26 DIAGNOSIS — Z882 Allergy status to sulfonamides status: Secondary | ICD-10-CM | POA: Diagnosis not present

## 2023-07-26 DIAGNOSIS — L02412 Cutaneous abscess of left axilla: Secondary | ICD-10-CM | POA: Diagnosis not present

## 2023-07-26 DIAGNOSIS — Z4803 Encounter for change or removal of drains: Secondary | ICD-10-CM | POA: Diagnosis not present

## 2023-07-26 DIAGNOSIS — L03112 Cellulitis of left axilla: Secondary | ICD-10-CM | POA: Diagnosis not present

## 2023-07-26 DIAGNOSIS — N179 Acute kidney failure, unspecified: Secondary | ICD-10-CM | POA: Diagnosis not present

## 2023-07-27 ENCOUNTER — Encounter: Payer: Self-pay | Admitting: Cardiology

## 2023-07-27 NOTE — Progress Notes (Signed)
Cardiology Office Note:  .   Date:  07/28/2023  ID:  Alyssa Hudson, DOB 1940/10/04, MRN [id number] PCP: Paulina Fusi, MD  [facility] Providers Cardiologist:  Thomasene Ripple, DO    History of Present Illness: .   Alyssa Hudson is a 82 y.o. female with a past medical history of atrial fibrillation, pericarditis, hypertension, hyponatremia, mild to moderate aortic valve sclerosis.  08/23/2020 monitor predominantly sinus rhythm, 6 episodes of SVT, no atrial fibrillation 06/29/2020 echo EF 66 5%, grade 2 DD, elevated LVEDP, mild to moderate aortic valve sclerosis without stenosis  Evaluated by Dr. Tomie China on 05/20/2022, her blood pressure was elevated in the office however she reports it was typically well-controlled at home, she was advised to follow-up in 1 year.  She was admitted 06/24/2023 for cellulitis secondary to right axillary abscess and chest wall infection.  She ultimately underwent I&D was started on broad-spectrum antibiotics and was ultimately discharged.  She then presented to [facility] emergency department on 07/26/2019 for for left axilla pain, labs were unremarkable, no evidence of infection, she was given Tylenol at discharge.  She presents today for follow-up of her SVT.  She has atrial fibrillation listed in her chart however it appears upon further evaluation that it was just episodes of SVT and she is not anticoagulated.  She offers no formal complaints from a cardiac perspective.  She is most bothered by left axilla pain--s/p I&D per above.  Interestingly enough now she has another abscess in her right axilla.  She will see her PCP tomorrow morning for further evaluation.  Also a nodule was found during her hospital admission, she has followed up with pulmonology, they are repeating a CT of her chest. She denies chest pain, palpitations, dyspnea, pnd, orthopnea, n, v, dizziness, syncope, edema, weight gain, or early satiety.   ROS: Review of Systems  Skin:         Recent abscess left axilla > today erythremic tender mass to right axilla     Studies Reviewed: Marland Kitchen   EKG Interpretation Date/Time:  Friday July 28 2023 15:42:56 EDT Ventricular Rate:  76 PR Interval:  236 QRS Duration:  88 QT Interval:  394 QTC Calculation: 443 R Axis:   -40  Text Interpretation: Sinus rhythm with 1st degree A-V block Left axis deviation Abnormal ECG No previous ECGs available Confirmed by Wallis Bamberg [phone]) on 07/28/2023 3:49:20 PM    Cardiac Studies & Procedures       ECHOCARDIOGRAM  ECHOCARDIOGRAM COMPLETE 06/29/2020  Narrative ECHOCARDIOGRAM REPORT    Patient Name:   Alyssa Hudson Date of Exam: 06/29/2020 Medical Rec #:  [id number]       Height:       65.0 in Accession #:    [id number]      Weight:       186.3 lb Date of Birth:  1941-08-15       BSA:          1.919 m Patient Age:    79 years        BP:           126/57 mmHg Patient Gender: F               HR:           92 bpm. Exam Location:  Inpatient  Procedure: 2D Echo, Cardiac Doppler, Color Doppler and Intracardiac Opacification Agent  Indications:    I48.0 Paroxysmal atrial fibrillation  History:  Patient has no prior history of Echocardiogram examinations. Risk Factors:Hypertension.  Sonographer:    Elmarie Shiley Dance Referring Phys: [id number] ERIK C HOFFMAN  IMPRESSIONS   1. Left ventricular ejection fraction, by estimation, is 60 to 65%. The left ventricle has normal function. The left ventricle has no regional wall motion abnormalities. Left ventricular diastolic parameters are consistent with Grade II diastolic dysfunction (pseudonormalization). Elevated left ventricular end-diastolic pressure. 2. Right ventricular systolic function is normal. The right ventricular size is normal. There is normal pulmonary artery systolic pressure. 3. Left atrial size was mildly dilated. 4. A small pericardial effusion is present. The pericardial effusion is lateral to the left  ventricle. 5. The mitral valve is normal in structure. No evidence of mitral valve regurgitation. No evidence of mitral stenosis. 6. The aortic valve is normal in structure. Aortic valve regurgitation is not visualized. Mild to moderate aortic valve sclerosis/calcification is present, without any evidence of aortic stenosis. 7. The inferior vena cava is dilated in size with <50% respiratory variability, suggesting right atrial pressure of 15 mmHg.  FINDINGS Left Ventricle: Left ventricular ejection fraction, by estimation, is 60 to 65%. The left ventricle has normal function. The left ventricle has no regional wall motion abnormalities. Definity contrast agent was given IV to delineate the left ventricular endocardial borders. The left ventricular internal cavity size was normal in size. There is no left ventricular hypertrophy. Left ventricular diastolic parameters are consistent with Grade II diastolic dysfunction (pseudonormalization). Elevated left ventricular end-diastolic pressure.  Right Ventricle: The right ventricular size is normal. No increase in right ventricular wall thickness. Right ventricular systolic function is normal. There is normal pulmonary artery systolic pressure. The tricuspid regurgitant velocity is 2.50 m/s, and with an assumed right atrial pressure of 8 mmHg, the estimated right ventricular systolic pressure is 33.0 mmHg.  Left Atrium: Left atrial size was mildly dilated.  Right Atrium: Right atrial size was normal in size.  Pericardium: A small pericardial effusion is present. The pericardial effusion is lateral to the left ventricle.  Mitral Valve: The mitral valve is normal in structure. No evidence of mitral valve regurgitation. No evidence of mitral valve stenosis.  Tricuspid Valve: The tricuspid valve is normal in structure. Tricuspid valve regurgitation is mild . No evidence of tricuspid stenosis.  Aortic Valve: The aortic valve is normal in structure. Aortic  valve regurgitation is not visualized. Mild to moderate aortic valve sclerosis/calcification is present, without any evidence of aortic stenosis.  Pulmonic Valve: The pulmonic valve was normal in structure. Pulmonic valve regurgitation is not visualized. No evidence of pulmonic stenosis.  Aorta: The aortic root is normal in size and structure.  Venous: The inferior vena cava is dilated in size with less than 50% respiratory variability, suggesting right atrial pressure of 15 mmHg.  IAS/Shunts: No atrial level shunt detected by color flow Doppler.   LEFT VENTRICLE PLAX 2D LVIDd:         4.51 cm  Diastology LVIDs:         3.11 cm  LV e' medial:    6.31 cm/s LV PW:         0.95 cm  LV E/e' medial:  16.8 LV IVS:        0.93 cm  LV e' lateral:   4.46 cm/s LVOT diam:     2.00 cm  LV E/e' lateral: 23.8 LV SV:         60 LV SV Index:   31 LVOT Area:     3.14  cm   RIGHT VENTRICLE             IVC RV Basal diam:  2.35 cm     IVC diam: 2.30 cm RV S prime:     14.00 cm/s TAPSE (M-mode): 1.8 cm  LEFT ATRIUM             Index LA diam:        3.90 cm 2.03 cm/m LA Vol (A2C):   55.9 ml 29.12 ml/m LA Vol (A4C):   50.3 ml 26.21 ml/m LA Biplane Vol: 53.4 ml 27.82 ml/m AORTIC VALVE LVOT Vmax:   97.40 cm/s LVOT Vmean:  67.700 cm/s LVOT VTI:    0.192 m  AORTA Ao Root diam: 3.50 cm Ao Asc diam:  3.00 cm  MITRAL VALVE                TRICUSPID VALVE MV Area (PHT): 2.62 cm     TR Peak grad:   25.0 mmHg MV Decel Time: 289 msec     TR Vmax:        250.00 cm/s MV E velocity: 106.00 cm/s MV A velocity: 111.00 cm/s  SHUNTS MV E/A ratio:  0.95         Systemic VTI:  0.19 m Systemic Diam: 2.00 cm  Charlton Haws MD Electronically signed by Charlton Haws MD Signature Date/Time: 06/29/2020/2:32:24 PM    Final    MONITORS  LONG TERM MONITOR (3-14 DAYS) 08/24/2020  Narrative The patient wore the monitor for 14 days starting July 23, 2020   . Indication: Paroxysmal atrial  fibrillation The minimum heart rate was 56 bpm, maximum heart rate was 120 bpm, and average heart rate was 79 bpm. Predominant underlying rhythm was Sinus Rhythm.  6 Supraventricular Tachycardia runs occurred, the run with the fastest interval lasting 13.7 secs with a maximum rate of 120 bpm (average 107 bpm); the run with the fastest interval was also the longest.  Premature atrial complexes were rare less than 1%. Premature Ventricular complexes were rare less than 1%.  No ventricular tachycardia, no pauses, No AV block and no atrial fibrillation present. 2 patient triggered events associated with sinus rhythm.  Conclusion: This study is remarkable for paroxysmal supraventricular tachycardia.  No evidence of atrial fibrillation.           Risk Assessment/Calculations:             Physical Exam:   VS:  BP 132/64   Pulse 76   Ht 5' 5.6" (1.666 m)   Wt 163 lb 6.4 oz (74.1 kg)   SpO2 97%   BMI 26.70 kg/m    Wt Readings from Last 3 Encounters:  07/28/23 163 lb 6.4 oz (74.1 kg)  05/20/22 163 lb (73.9 kg)  05/25/21 166 lb (75.3 kg)    GEN: Well nourished, well developed in no acute distress NECK: No JVD; No carotid bruits CARDIAC: RRR, no murmurs, rubs, gallops RESPIRATORY:  Clear to auscultation without rales, wheezing or rhonchi  ABDOMEN: Soft, non-tender, non-distended EXTREMITIES:  No edema; No deformity   ASSESSMENT AND PLAN: .   SVT-quiescent, continue metoprolol 100 mg daily. HFpEF-most recent echo revealed preserved EF, grade 2 DD.  She reports an episode when she was hospitalized that she had not been on her HCTZ for a few days, woke up short of breath, she was diuresed, and her HCTZ was restarted.  NYHA class I, euvolemic.  Continue metoprolol 100 mg daily.  Hypertension-blood pressure is controlled 132/64, continue Norvasc  10 mg daily.       Dispo:  Follow up in 1 year.   Signed, Flossie Dibble, NP

## 2023-07-28 ENCOUNTER — Encounter: Payer: Self-pay | Admitting: Cardiology

## 2023-07-28 ENCOUNTER — Ambulatory Visit: Payer: Medicare PPO | Attending: Cardiology | Admitting: Cardiology

## 2023-07-28 VITALS — BP 132/64 | HR 76 | Ht 65.6 in | Wt 163.4 lb

## 2023-07-28 DIAGNOSIS — I471 Supraventricular tachycardia, unspecified: Secondary | ICD-10-CM

## 2023-07-28 DIAGNOSIS — I10 Essential (primary) hypertension: Secondary | ICD-10-CM | POA: Diagnosis not present

## 2023-07-28 DIAGNOSIS — I5030 Unspecified diastolic (congestive) heart failure: Secondary | ICD-10-CM

## 2023-07-28 NOTE — Patient Instructions (Signed)
Medication Instructions:  Your physician recommends that you continue on your current medications as directed. Please refer to the Current Medication list given to you today.  *If you need a refill on your cardiac medications before your next appointment, please call your pharmacy*   Lab Work: NONE If you have labs (blood work) drawn today and your tests are completely normal, you will receive your results only by: MyChart Message (if you have MyChart) OR A paper copy in the mail If you have any lab test that is abnormal or we need to change your treatment, we will call you to review the results.   Testing/Procedures: NONE   Follow-Up: At [facility], you and your health needs are our priority.  As part of our continuing mission to provide you with exceptional heart care, we have created designated Provider Care Teams.  These Care Teams include your primary Cardiologist (physician) and Advanced Practice Providers (APPs -  Physician Assistants and Nurse Practitioners) who all work together to provide you with the care you need, when you need it.  We recommend signing up for the patient portal called "MyChart".  Sign up information is provided on this After Visit Summary.  MyChart is used to connect with patients for Virtual Visits (Telemedicine).  Patients are able to view lab/test results, encounter notes, upcoming appointments, etc.  Non-urgent messages can be sent to your provider as well.   To learn more about what you can do with MyChart, go to [email or web address].    Your next appointment:   1 year(s)  Provider:   Rajan Revankar, MD    Other Instructions   

## 2023-07-29 DIAGNOSIS — L02411 Cutaneous abscess of right axilla: Secondary | ICD-10-CM | POA: Diagnosis not present

## 2023-07-29 DIAGNOSIS — Z6827 Body mass index (BMI) 27.0-27.9, adult: Secondary | ICD-10-CM | POA: Diagnosis not present

## 2023-08-01 DIAGNOSIS — N179 Acute kidney failure, unspecified: Secondary | ICD-10-CM | POA: Diagnosis not present

## 2023-08-01 DIAGNOSIS — I110 Hypertensive heart disease with heart failure: Secondary | ICD-10-CM | POA: Diagnosis not present

## 2023-08-01 DIAGNOSIS — L03112 Cellulitis of left axilla: Secondary | ICD-10-CM | POA: Diagnosis not present

## 2023-08-01 DIAGNOSIS — Z4800 Encounter for change or removal of nonsurgical wound dressing: Secondary | ICD-10-CM | POA: Diagnosis not present

## 2023-08-01 DIAGNOSIS — I4891 Unspecified atrial fibrillation: Secondary | ICD-10-CM | POA: Diagnosis not present

## 2023-08-01 DIAGNOSIS — L02411 Cutaneous abscess of right axilla: Secondary | ICD-10-CM | POA: Diagnosis not present

## 2023-08-01 DIAGNOSIS — Z4803 Encounter for change or removal of drains: Secondary | ICD-10-CM | POA: Diagnosis not present

## 2023-08-01 DIAGNOSIS — L02412 Cutaneous abscess of left axilla: Secondary | ICD-10-CM | POA: Diagnosis not present

## 2023-08-01 DIAGNOSIS — D696 Thrombocytopenia, unspecified: Secondary | ICD-10-CM | POA: Diagnosis not present

## 2023-08-01 DIAGNOSIS — I5030 Unspecified diastolic (congestive) heart failure: Secondary | ICD-10-CM | POA: Diagnosis not present

## 2023-08-04 DIAGNOSIS — L02411 Cutaneous abscess of right axilla: Secondary | ICD-10-CM | POA: Diagnosis not present

## 2023-08-14 DIAGNOSIS — R918 Other nonspecific abnormal finding of lung field: Secondary | ICD-10-CM | POA: Diagnosis not present

## 2023-08-17 DIAGNOSIS — Z8616 Personal history of COVID-19: Secondary | ICD-10-CM | POA: Diagnosis not present

## 2023-08-17 DIAGNOSIS — R911 Solitary pulmonary nodule: Secondary | ICD-10-CM | POA: Diagnosis not present

## 2023-08-17 DIAGNOSIS — R918 Other nonspecific abnormal finding of lung field: Secondary | ICD-10-CM | POA: Diagnosis not present

## 2023-08-18 DIAGNOSIS — L02411 Cutaneous abscess of right axilla: Secondary | ICD-10-CM | POA: Diagnosis not present

## 2023-08-23 DIAGNOSIS — R911 Solitary pulmonary nodule: Secondary | ICD-10-CM | POA: Diagnosis not present

## 2023-08-28 DIAGNOSIS — R911 Solitary pulmonary nodule: Secondary | ICD-10-CM | POA: Diagnosis not present

## 2023-08-30 DIAGNOSIS — L03112 Cellulitis of left axilla: Secondary | ICD-10-CM | POA: Diagnosis not present

## 2023-08-30 DIAGNOSIS — L02412 Cutaneous abscess of left axilla: Secondary | ICD-10-CM | POA: Diagnosis not present

## 2023-08-30 DIAGNOSIS — Z4803 Encounter for change or removal of drains: Secondary | ICD-10-CM | POA: Diagnosis not present

## 2023-08-30 DIAGNOSIS — I4891 Unspecified atrial fibrillation: Secondary | ICD-10-CM | POA: Diagnosis not present

## 2023-08-30 DIAGNOSIS — I5030 Unspecified diastolic (congestive) heart failure: Secondary | ICD-10-CM | POA: Diagnosis not present

## 2023-08-30 DIAGNOSIS — I110 Hypertensive heart disease with heart failure: Secondary | ICD-10-CM | POA: Diagnosis not present

## 2023-08-30 DIAGNOSIS — N179 Acute kidney failure, unspecified: Secondary | ICD-10-CM | POA: Diagnosis not present

## 2023-08-30 DIAGNOSIS — D696 Thrombocytopenia, unspecified: Secondary | ICD-10-CM | POA: Diagnosis not present

## 2023-08-30 DIAGNOSIS — Z4800 Encounter for change or removal of nonsurgical wound dressing: Secondary | ICD-10-CM | POA: Diagnosis not present

## 2023-09-05 DIAGNOSIS — S92912A Unspecified fracture of left toe(s), initial encounter for closed fracture: Secondary | ICD-10-CM | POA: Diagnosis not present

## 2023-09-06 DIAGNOSIS — R911 Solitary pulmonary nodule: Secondary | ICD-10-CM | POA: Diagnosis not present

## 2023-09-06 DIAGNOSIS — Z8616 Personal history of COVID-19: Secondary | ICD-10-CM | POA: Diagnosis not present

## 2023-09-06 DIAGNOSIS — R918 Other nonspecific abnormal finding of lung field: Secondary | ICD-10-CM | POA: Diagnosis not present

## 2023-09-08 DIAGNOSIS — I5032 Chronic diastolic (congestive) heart failure: Secondary | ICD-10-CM | POA: Diagnosis not present

## 2023-09-08 DIAGNOSIS — E785 Hyperlipidemia, unspecified: Secondary | ICD-10-CM | POA: Diagnosis not present

## 2023-09-08 DIAGNOSIS — I129 Hypertensive chronic kidney disease with stage 1 through stage 4 chronic kidney disease, or unspecified chronic kidney disease: Secondary | ICD-10-CM | POA: Diagnosis not present

## 2023-09-08 DIAGNOSIS — Z6827 Body mass index (BMI) 27.0-27.9, adult: Secondary | ICD-10-CM | POA: Diagnosis not present

## 2023-09-08 DIAGNOSIS — I480 Paroxysmal atrial fibrillation: Secondary | ICD-10-CM | POA: Diagnosis not present

## 2023-09-08 DIAGNOSIS — Z23 Encounter for immunization: Secondary | ICD-10-CM | POA: Diagnosis not present

## 2023-09-08 DIAGNOSIS — N183 Chronic kidney disease, stage 3 unspecified: Secondary | ICD-10-CM | POA: Diagnosis not present

## 2023-10-11 DIAGNOSIS — H353131 Nonexudative age-related macular degeneration, bilateral, early dry stage: Secondary | ICD-10-CM | POA: Diagnosis not present

## 2023-10-19 DIAGNOSIS — S99922D Unspecified injury of left foot, subsequent encounter: Secondary | ICD-10-CM | POA: Diagnosis not present

## 2023-11-01 DIAGNOSIS — R911 Solitary pulmonary nodule: Secondary | ICD-10-CM | POA: Diagnosis not present

## 2023-11-06 DIAGNOSIS — G518 Other disorders of facial nerve: Secondary | ICD-10-CM | POA: Diagnosis not present

## 2023-12-02 DIAGNOSIS — R911 Solitary pulmonary nodule: Secondary | ICD-10-CM | POA: Diagnosis not present

## 2023-12-28 DIAGNOSIS — G4762 Sleep related leg cramps: Secondary | ICD-10-CM | POA: Diagnosis not present

## 2023-12-28 DIAGNOSIS — N183 Chronic kidney disease, stage 3 unspecified: Secondary | ICD-10-CM | POA: Diagnosis not present

## 2023-12-28 DIAGNOSIS — E785 Hyperlipidemia, unspecified: Secondary | ICD-10-CM | POA: Diagnosis not present

## 2023-12-28 DIAGNOSIS — I129 Hypertensive chronic kidney disease with stage 1 through stage 4 chronic kidney disease, or unspecified chronic kidney disease: Secondary | ICD-10-CM | POA: Diagnosis not present

## 2023-12-28 DIAGNOSIS — I480 Paroxysmal atrial fibrillation: Secondary | ICD-10-CM | POA: Diagnosis not present

## 2023-12-28 DIAGNOSIS — I5032 Chronic diastolic (congestive) heart failure: Secondary | ICD-10-CM | POA: Diagnosis not present

## 2023-12-28 DIAGNOSIS — R600 Localized edema: Secondary | ICD-10-CM | POA: Diagnosis not present

## 2023-12-28 DIAGNOSIS — R300 Dysuria: Secondary | ICD-10-CM | POA: Diagnosis not present

## 2024-01-08 DIAGNOSIS — M7062 Trochanteric bursitis, left hip: Secondary | ICD-10-CM | POA: Diagnosis not present

## 2024-01-16 DIAGNOSIS — M545 Low back pain, unspecified: Secondary | ICD-10-CM | POA: Diagnosis not present

## 2024-01-23 DIAGNOSIS — L219 Seborrheic dermatitis, unspecified: Secondary | ICD-10-CM | POA: Diagnosis not present

## 2024-01-23 DIAGNOSIS — L570 Actinic keratosis: Secondary | ICD-10-CM | POA: Diagnosis not present

## 2024-02-12 DIAGNOSIS — G518 Other disorders of facial nerve: Secondary | ICD-10-CM | POA: Diagnosis not present

## 2024-02-20 DIAGNOSIS — H1032 Unspecified acute conjunctivitis, left eye: Secondary | ICD-10-CM | POA: Diagnosis not present

## 2024-02-28 DIAGNOSIS — H109 Unspecified conjunctivitis: Secondary | ICD-10-CM | POA: Diagnosis not present

## 2024-03-07 DIAGNOSIS — R911 Solitary pulmonary nodule: Secondary | ICD-10-CM | POA: Diagnosis not present

## 2024-03-08 DIAGNOSIS — Z1231 Encounter for screening mammogram for malignant neoplasm of breast: Secondary | ICD-10-CM | POA: Diagnosis not present

## 2024-03-09 DIAGNOSIS — R911 Solitary pulmonary nodule: Secondary | ICD-10-CM | POA: Diagnosis not present

## 2024-03-22 DIAGNOSIS — R918 Other nonspecific abnormal finding of lung field: Secondary | ICD-10-CM | POA: Diagnosis not present

## 2024-03-22 DIAGNOSIS — R911 Solitary pulmonary nodule: Secondary | ICD-10-CM | POA: Diagnosis not present

## 2024-03-22 DIAGNOSIS — Z8616 Personal history of COVID-19: Secondary | ICD-10-CM | POA: Diagnosis not present

## 2024-04-12 DIAGNOSIS — G4762 Sleep related leg cramps: Secondary | ICD-10-CM | POA: Diagnosis not present

## 2024-04-12 DIAGNOSIS — I480 Paroxysmal atrial fibrillation: Secondary | ICD-10-CM | POA: Diagnosis not present

## 2024-04-12 DIAGNOSIS — I129 Hypertensive chronic kidney disease with stage 1 through stage 4 chronic kidney disease, or unspecified chronic kidney disease: Secondary | ICD-10-CM | POA: Diagnosis not present

## 2024-04-12 DIAGNOSIS — E785 Hyperlipidemia, unspecified: Secondary | ICD-10-CM | POA: Diagnosis not present

## 2024-04-12 DIAGNOSIS — M898X2 Other specified disorders of bone, upper arm: Secondary | ICD-10-CM | POA: Diagnosis not present

## 2024-04-12 DIAGNOSIS — S42201A Unspecified fracture of upper end of right humerus, initial encounter for closed fracture: Secondary | ICD-10-CM | POA: Diagnosis not present

## 2024-04-12 DIAGNOSIS — M25511 Pain in right shoulder: Secondary | ICD-10-CM | POA: Diagnosis not present

## 2024-04-12 DIAGNOSIS — S42291A Other displaced fracture of upper end of right humerus, initial encounter for closed fracture: Secondary | ICD-10-CM | POA: Diagnosis not present

## 2024-04-12 DIAGNOSIS — N183 Chronic kidney disease, stage 3 unspecified: Secondary | ICD-10-CM | POA: Diagnosis not present

## 2024-04-12 DIAGNOSIS — R600 Localized edema: Secondary | ICD-10-CM | POA: Diagnosis not present

## 2024-04-12 DIAGNOSIS — I5032 Chronic diastolic (congestive) heart failure: Secondary | ICD-10-CM | POA: Diagnosis not present

## 2024-04-12 DIAGNOSIS — R7301 Impaired fasting glucose: Secondary | ICD-10-CM | POA: Diagnosis not present

## 2024-04-12 DIAGNOSIS — S42251A Displaced fracture of greater tuberosity of right humerus, initial encounter for closed fracture: Secondary | ICD-10-CM | POA: Diagnosis not present

## 2024-04-18 DIAGNOSIS — S42291A Other displaced fracture of upper end of right humerus, initial encounter for closed fracture: Secondary | ICD-10-CM | POA: Diagnosis not present

## 2024-05-13 DIAGNOSIS — G518 Other disorders of facial nerve: Secondary | ICD-10-CM | POA: Diagnosis not present

## 2024-05-21 DIAGNOSIS — S42291A Other displaced fracture of upper end of right humerus, initial encounter for closed fracture: Secondary | ICD-10-CM | POA: Diagnosis not present

## 2024-05-28 DIAGNOSIS — M62521 Muscle wasting and atrophy, not elsewhere classified, right upper arm: Secondary | ICD-10-CM | POA: Diagnosis not present

## 2024-05-28 DIAGNOSIS — S42291A Other displaced fracture of upper end of right humerus, initial encounter for closed fracture: Secondary | ICD-10-CM | POA: Diagnosis not present

## 2024-05-28 DIAGNOSIS — M25511 Pain in right shoulder: Secondary | ICD-10-CM | POA: Diagnosis not present

## 2024-05-31 DIAGNOSIS — M62521 Muscle wasting and atrophy, not elsewhere classified, right upper arm: Secondary | ICD-10-CM | POA: Diagnosis not present

## 2024-05-31 DIAGNOSIS — M25511 Pain in right shoulder: Secondary | ICD-10-CM | POA: Diagnosis not present

## 2024-05-31 DIAGNOSIS — S42291A Other displaced fracture of upper end of right humerus, initial encounter for closed fracture: Secondary | ICD-10-CM | POA: Diagnosis not present

## 2024-06-03 DIAGNOSIS — L02219 Cutaneous abscess of trunk, unspecified: Secondary | ICD-10-CM | POA: Diagnosis not present

## 2024-06-03 DIAGNOSIS — M62521 Muscle wasting and atrophy, not elsewhere classified, right upper arm: Secondary | ICD-10-CM | POA: Diagnosis not present

## 2024-06-03 DIAGNOSIS — M25511 Pain in right shoulder: Secondary | ICD-10-CM | POA: Diagnosis not present

## 2024-06-03 DIAGNOSIS — S42291A Other displaced fracture of upper end of right humerus, initial encounter for closed fracture: Secondary | ICD-10-CM | POA: Diagnosis not present

## 2024-06-04 DIAGNOSIS — L02219 Cutaneous abscess of trunk, unspecified: Secondary | ICD-10-CM | POA: Diagnosis not present

## 2024-06-07 DIAGNOSIS — M62521 Muscle wasting and atrophy, not elsewhere classified, right upper arm: Secondary | ICD-10-CM | POA: Diagnosis not present

## 2024-06-07 DIAGNOSIS — M25511 Pain in right shoulder: Secondary | ICD-10-CM | POA: Diagnosis not present

## 2024-06-07 DIAGNOSIS — S42291A Other displaced fracture of upper end of right humerus, initial encounter for closed fracture: Secondary | ICD-10-CM | POA: Diagnosis not present

## 2024-06-10 DIAGNOSIS — M25511 Pain in right shoulder: Secondary | ICD-10-CM | POA: Diagnosis not present

## 2024-06-10 DIAGNOSIS — M62521 Muscle wasting and atrophy, not elsewhere classified, right upper arm: Secondary | ICD-10-CM | POA: Diagnosis not present

## 2024-06-10 DIAGNOSIS — S42291A Other displaced fracture of upper end of right humerus, initial encounter for closed fracture: Secondary | ICD-10-CM | POA: Diagnosis not present

## 2024-06-11 DIAGNOSIS — L02219 Cutaneous abscess of trunk, unspecified: Secondary | ICD-10-CM | POA: Diagnosis not present

## 2024-06-14 DIAGNOSIS — M25511 Pain in right shoulder: Secondary | ICD-10-CM | POA: Diagnosis not present

## 2024-06-14 DIAGNOSIS — M62521 Muscle wasting and atrophy, not elsewhere classified, right upper arm: Secondary | ICD-10-CM | POA: Diagnosis not present

## 2024-06-14 DIAGNOSIS — S42291A Other displaced fracture of upper end of right humerus, initial encounter for closed fracture: Secondary | ICD-10-CM | POA: Diagnosis not present

## 2024-06-17 DIAGNOSIS — M62521 Muscle wasting and atrophy, not elsewhere classified, right upper arm: Secondary | ICD-10-CM | POA: Diagnosis not present

## 2024-06-17 DIAGNOSIS — M25511 Pain in right shoulder: Secondary | ICD-10-CM | POA: Diagnosis not present

## 2024-06-17 DIAGNOSIS — S42291A Other displaced fracture of upper end of right humerus, initial encounter for closed fracture: Secondary | ICD-10-CM | POA: Diagnosis not present

## 2024-06-17 DIAGNOSIS — Z6826 Body mass index (BMI) 26.0-26.9, adult: Secondary | ICD-10-CM | POA: Diagnosis not present

## 2024-06-17 DIAGNOSIS — K429 Umbilical hernia without obstruction or gangrene: Secondary | ICD-10-CM | POA: Diagnosis not present

## 2024-06-17 DIAGNOSIS — R29898 Other symptoms and signs involving the musculoskeletal system: Secondary | ICD-10-CM | POA: Diagnosis not present

## 2024-06-20 DIAGNOSIS — S42291A Other displaced fracture of upper end of right humerus, initial encounter for closed fracture: Secondary | ICD-10-CM | POA: Diagnosis not present

## 2024-06-20 DIAGNOSIS — M62521 Muscle wasting and atrophy, not elsewhere classified, right upper arm: Secondary | ICD-10-CM | POA: Diagnosis not present

## 2024-06-20 DIAGNOSIS — R29898 Other symptoms and signs involving the musculoskeletal system: Secondary | ICD-10-CM | POA: Diagnosis not present

## 2024-06-20 DIAGNOSIS — M25511 Pain in right shoulder: Secondary | ICD-10-CM | POA: Diagnosis not present

## 2024-06-25 DIAGNOSIS — R29898 Other symptoms and signs involving the musculoskeletal system: Secondary | ICD-10-CM | POA: Diagnosis not present

## 2024-06-25 DIAGNOSIS — M62521 Muscle wasting and atrophy, not elsewhere classified, right upper arm: Secondary | ICD-10-CM | POA: Diagnosis not present

## 2024-06-25 DIAGNOSIS — M25511 Pain in right shoulder: Secondary | ICD-10-CM | POA: Diagnosis not present

## 2024-06-25 DIAGNOSIS — S42291A Other displaced fracture of upper end of right humerus, initial encounter for closed fracture: Secondary | ICD-10-CM | POA: Diagnosis not present

## 2024-06-26 DIAGNOSIS — S42291A Other displaced fracture of upper end of right humerus, initial encounter for closed fracture: Secondary | ICD-10-CM | POA: Diagnosis not present

## 2024-06-26 DIAGNOSIS — M25511 Pain in right shoulder: Secondary | ICD-10-CM | POA: Diagnosis not present

## 2024-06-28 DIAGNOSIS — S42291A Other displaced fracture of upper end of right humerus, initial encounter for closed fracture: Secondary | ICD-10-CM | POA: Diagnosis not present

## 2024-06-28 DIAGNOSIS — M25511 Pain in right shoulder: Secondary | ICD-10-CM | POA: Diagnosis not present

## 2024-06-28 DIAGNOSIS — M62521 Muscle wasting and atrophy, not elsewhere classified, right upper arm: Secondary | ICD-10-CM | POA: Diagnosis not present

## 2024-06-28 DIAGNOSIS — R29898 Other symptoms and signs involving the musculoskeletal system: Secondary | ICD-10-CM | POA: Diagnosis not present

## 2024-07-02 DIAGNOSIS — M25511 Pain in right shoulder: Secondary | ICD-10-CM | POA: Diagnosis not present

## 2024-07-02 DIAGNOSIS — M62521 Muscle wasting and atrophy, not elsewhere classified, right upper arm: Secondary | ICD-10-CM | POA: Diagnosis not present

## 2024-07-02 DIAGNOSIS — R29898 Other symptoms and signs involving the musculoskeletal system: Secondary | ICD-10-CM | POA: Diagnosis not present

## 2024-07-02 DIAGNOSIS — S42291A Other displaced fracture of upper end of right humerus, initial encounter for closed fracture: Secondary | ICD-10-CM | POA: Diagnosis not present

## 2024-07-04 DIAGNOSIS — S42291A Other displaced fracture of upper end of right humerus, initial encounter for closed fracture: Secondary | ICD-10-CM | POA: Diagnosis not present

## 2024-07-04 DIAGNOSIS — M25511 Pain in right shoulder: Secondary | ICD-10-CM | POA: Diagnosis not present

## 2024-07-04 DIAGNOSIS — M62521 Muscle wasting and atrophy, not elsewhere classified, right upper arm: Secondary | ICD-10-CM | POA: Diagnosis not present

## 2024-07-04 DIAGNOSIS — R29898 Other symptoms and signs involving the musculoskeletal system: Secondary | ICD-10-CM | POA: Diagnosis not present

## 2024-07-08 DIAGNOSIS — M62521 Muscle wasting and atrophy, not elsewhere classified, right upper arm: Secondary | ICD-10-CM | POA: Diagnosis not present

## 2024-07-08 DIAGNOSIS — R29898 Other symptoms and signs involving the musculoskeletal system: Secondary | ICD-10-CM | POA: Diagnosis not present

## 2024-07-08 DIAGNOSIS — M25511 Pain in right shoulder: Secondary | ICD-10-CM | POA: Diagnosis not present

## 2024-07-08 DIAGNOSIS — S42291A Other displaced fracture of upper end of right humerus, initial encounter for closed fracture: Secondary | ICD-10-CM | POA: Diagnosis not present

## 2024-07-11 DIAGNOSIS — S42291A Other displaced fracture of upper end of right humerus, initial encounter for closed fracture: Secondary | ICD-10-CM | POA: Diagnosis not present

## 2024-07-11 DIAGNOSIS — M25511 Pain in right shoulder: Secondary | ICD-10-CM | POA: Diagnosis not present

## 2024-07-11 DIAGNOSIS — R29898 Other symptoms and signs involving the musculoskeletal system: Secondary | ICD-10-CM | POA: Diagnosis not present

## 2024-07-11 DIAGNOSIS — M62521 Muscle wasting and atrophy, not elsewhere classified, right upper arm: Secondary | ICD-10-CM | POA: Diagnosis not present

## 2024-07-15 DIAGNOSIS — M62521 Muscle wasting and atrophy, not elsewhere classified, right upper arm: Secondary | ICD-10-CM | POA: Diagnosis not present

## 2024-07-15 DIAGNOSIS — R29898 Other symptoms and signs involving the musculoskeletal system: Secondary | ICD-10-CM | POA: Diagnosis not present

## 2024-07-15 DIAGNOSIS — S42291A Other displaced fracture of upper end of right humerus, initial encounter for closed fracture: Secondary | ICD-10-CM | POA: Diagnosis not present

## 2024-07-15 DIAGNOSIS — M25511 Pain in right shoulder: Secondary | ICD-10-CM | POA: Diagnosis not present

## 2024-07-18 DIAGNOSIS — R29898 Other symptoms and signs involving the musculoskeletal system: Secondary | ICD-10-CM | POA: Diagnosis not present

## 2024-07-18 DIAGNOSIS — M25511 Pain in right shoulder: Secondary | ICD-10-CM | POA: Diagnosis not present

## 2024-07-18 DIAGNOSIS — M62521 Muscle wasting and atrophy, not elsewhere classified, right upper arm: Secondary | ICD-10-CM | POA: Diagnosis not present

## 2024-07-18 DIAGNOSIS — S42291A Other displaced fracture of upper end of right humerus, initial encounter for closed fracture: Secondary | ICD-10-CM | POA: Diagnosis not present

## 2024-07-22 DIAGNOSIS — K429 Umbilical hernia without obstruction or gangrene: Secondary | ICD-10-CM | POA: Diagnosis not present

## 2024-07-22 DIAGNOSIS — I129 Hypertensive chronic kidney disease with stage 1 through stage 4 chronic kidney disease, or unspecified chronic kidney disease: Secondary | ICD-10-CM | POA: Diagnosis not present

## 2024-07-22 DIAGNOSIS — G4762 Sleep related leg cramps: Secondary | ICD-10-CM | POA: Diagnosis not present

## 2024-07-22 DIAGNOSIS — I480 Paroxysmal atrial fibrillation: Secondary | ICD-10-CM | POA: Diagnosis not present

## 2024-07-22 DIAGNOSIS — N183 Chronic kidney disease, stage 3 unspecified: Secondary | ICD-10-CM | POA: Diagnosis not present

## 2024-07-22 DIAGNOSIS — I5032 Chronic diastolic (congestive) heart failure: Secondary | ICD-10-CM | POA: Diagnosis not present

## 2024-07-22 DIAGNOSIS — E785 Hyperlipidemia, unspecified: Secondary | ICD-10-CM | POA: Diagnosis not present

## 2024-07-22 DIAGNOSIS — R600 Localized edema: Secondary | ICD-10-CM | POA: Diagnosis not present

## 2024-07-23 DIAGNOSIS — M25511 Pain in right shoulder: Secondary | ICD-10-CM | POA: Diagnosis not present

## 2024-07-23 DIAGNOSIS — S42291A Other displaced fracture of upper end of right humerus, initial encounter for closed fracture: Secondary | ICD-10-CM | POA: Diagnosis not present

## 2024-07-23 DIAGNOSIS — R29898 Other symptoms and signs involving the musculoskeletal system: Secondary | ICD-10-CM | POA: Diagnosis not present

## 2024-07-23 DIAGNOSIS — M62521 Muscle wasting and atrophy, not elsewhere classified, right upper arm: Secondary | ICD-10-CM | POA: Diagnosis not present

## 2024-07-24 DIAGNOSIS — D485 Neoplasm of uncertain behavior of skin: Secondary | ICD-10-CM | POA: Diagnosis not present

## 2024-07-25 DIAGNOSIS — M25511 Pain in right shoulder: Secondary | ICD-10-CM | POA: Diagnosis not present

## 2024-07-25 DIAGNOSIS — M62521 Muscle wasting and atrophy, not elsewhere classified, right upper arm: Secondary | ICD-10-CM | POA: Diagnosis not present

## 2024-07-25 DIAGNOSIS — S42291A Other displaced fracture of upper end of right humerus, initial encounter for closed fracture: Secondary | ICD-10-CM | POA: Diagnosis not present

## 2024-07-25 DIAGNOSIS — R29898 Other symptoms and signs involving the musculoskeletal system: Secondary | ICD-10-CM | POA: Diagnosis not present

## 2024-07-29 DIAGNOSIS — M25511 Pain in right shoulder: Secondary | ICD-10-CM | POA: Diagnosis not present

## 2024-07-29 DIAGNOSIS — S42291A Other displaced fracture of upper end of right humerus, initial encounter for closed fracture: Secondary | ICD-10-CM | POA: Diagnosis not present

## 2024-07-29 DIAGNOSIS — R29898 Other symptoms and signs involving the musculoskeletal system: Secondary | ICD-10-CM | POA: Diagnosis not present

## 2024-07-29 DIAGNOSIS — M62521 Muscle wasting and atrophy, not elsewhere classified, right upper arm: Secondary | ICD-10-CM | POA: Diagnosis not present

## 2024-07-30 DIAGNOSIS — C44222 Squamous cell carcinoma of skin of right ear and external auricular canal: Secondary | ICD-10-CM | POA: Diagnosis not present

## 2024-07-31 ENCOUNTER — Encounter: Payer: Self-pay | Admitting: Cardiology

## 2024-07-31 ENCOUNTER — Ambulatory Visit

## 2024-07-31 ENCOUNTER — Ambulatory Visit: Attending: Cardiology | Admitting: Cardiology

## 2024-07-31 VITALS — BP 166/68 | HR 69 | Ht 66.0 in | Wt 160.8 lb

## 2024-07-31 DIAGNOSIS — I10 Essential (primary) hypertension: Secondary | ICD-10-CM

## 2024-07-31 DIAGNOSIS — R002 Palpitations: Secondary | ICD-10-CM | POA: Diagnosis not present

## 2024-07-31 NOTE — Progress Notes (Signed)
 Cardiology Office Note:    Date:  07/31/2024   ID:  Alyssa Hudson, DOB 21-Dec-1940, MRN [id number]  PCP:  Alyssa Vicenta BRAVO, MD  Cardiologist:  Alyssa JONELLE Crape, MD   Referring MD: Alyssa Vicenta BRAVO, MD    ASSESSMENT:    1. Primary hypertension   2. Palpitations    PLAN:    In order of problems listed above:  Primary prevention stressed with the patient.  Importance of compliance with diet medication stressed and patient verbalized standing. Essential hypertension: Blood pressure is stable and diet was emphasized.  Lifestyle modification urged.  Her blood pressures are stable at home and she has an element of whitecoat hypertension. History of pericarditis: No recurrence.  She is comfortable.  She has no symptoms. Patient will be seen in follow-up appointment in 12 months or earlier if the patient has any concerns.    Medication Adjustments/Labs and Tests Ordered: Current medicines are reviewed at length with the patient today.  Concerns regarding medicines are outlined above.  Orders Placed This Encounter  Procedures   LONG TERM MONITOR (3-14 DAYS)   EKG 12-Lead   No orders of the defined types were placed in this encounter.    No chief complaint on file.    History of Present Illness:    Alyssa Hudson is a 83 y.o. female.  Patient has past medical history of essential hypertension.  She denies any problems at this time and takes care of activities of daily living.  No chest pain orthopnea or PND.  She tells me that she has neuropathy for which reason her gait is not stable.  Her blood pressure is fine at home it is in the range of 130/60.  She tells me that she had blood pressure reading which was similar at her primary doctor's office recently.  At the time of my evaluation, the patient is alert awake oriented and in no distress.  Her husband accompanies her for this visit.  Past Medical History:  Diagnosis Date   Acute diastolic heart failure (HCC) 06/30/2020    Acute pericarditis 07/24/2020   Aftercare following surgery 06/09/2016   Aortic valve sclerosis 2021   Moderate per echo in 2021   Atrial fibrillation (HCC) 07/24/2020   BMI 33.0-33.9,adult    Carpal tunnel syndrome, right 08/13/2020   Cervical radiculopathy 08/13/2020   Cervical radiculopathy due to osteoarthritis of spine    Diverticulosis    Encephalopathy 06/19/2020   Facial droop    Generalized osteoarthritis of multiple sites    Hypertension    Hyponatremia 06/20/2020   Primary hypertension 07/24/2020   Tricompartment osteoarthritis of right knee    Ulnar neuropathy at elbow, right 08/13/2020   Viral pericarditis with pericardial effusion 06/30/2020    Past Surgical History:  Procedure Laterality Date   CARPAL TUNNEL RELEASE Right 10/26/2020   CATARACT EXTRACTION  2003   insert prosthetic lens   CHOLECYSTECTOMY  01/1997   IR FLUORO GUIDED NEEDLE PLC ASPIRATION/INJECTION LOC  06/23/2020   RADIOLOGY WITH ANESTHESIA N/A 06/23/2020   Procedure: IR WITH ANESTHESIA;  Surgeon: Radiologist, Medication, MD;  Location: [facility] OR;  Service: Radiology;  Laterality: N/A;   TOTAL HYSTERECTOMY  02/1997    Current Medications: Current Meds  Medication Sig   amLODipine  (NORVASC ) 10 MG tablet Take 1 tablet (10 mg total) by mouth daily.   Biotin 5000 MCG TABS Take 5,000 mcg by mouth daily.   CRANBERRY PO Take 650 mg by mouth daily.   furosemide  (LASIX ) 40 MG  tablet Take 40 mg by mouth daily.   losartan  (COZAAR ) 100 MG tablet Take 100 mg by mouth daily.   MAGNESIUM  PO Take 400 mg by mouth daily.   metoprolol  succinate (TOPROL -XL) 100 MG 24 hr tablet Take 100 mg by mouth daily.   OVER THE COUNTER MEDICATION Take 4 capsules by mouth daily. MACULAR PROTECT   Probiotic Product (PROBIOTIC PO) Take 1 tablet by mouth daily.   [DISCONTINUED] CALCIUM PO Take 600 mg by mouth daily.     Allergies:   Codeine, Contrast media [iodinated contrast media], Iodine-131, and Sulfa antibiotics   Social  History   Socioeconomic History   Marital status: Married    Spouse name: Not on file   Number of children: 2   Years of education: Not on file   Highest education level: High school graduate  Occupational History   Not on file  Tobacco Use   Smoking status: Never   Smokeless tobacco: Never  Substance and Sexual Activity   Alcohol use: Never   Drug use: Never   Sexual activity: Not on file  Other Topics Concern   Not on file  Social History Narrative   Lives at home with husband   Caffeine: none    Social Drivers of Corporate Investment Banker Strain: Not on file  Food Insecurity: Not on file  Transportation Needs: Not on file  Physical Activity: Not on file  Stress: Not on file  Social Connections: Not on file     Family History: The patient's family history includes Alcohol abuse in her brother; Alzheimer's disease (age of onset: 82) in her sister; CVA in her mother; Hypertension in her father and mother; Muscular dystrophy in her brother; Prostate cancer (age of onset: 9) in her father; Stroke in her brother.  ROS:   Please see the history of present illness.    All other systems reviewed and are negative.  EKGs/Labs/Other Studies Reviewed:    The following studies were reviewed today: .SABRAEKG Interpretation Date/Time:  Wednesday July 31 2024 15:05:25 EST Ventricular Rate:  69 PR Interval:  254 QRS Duration:  84 QT Interval:  418 QTC Calculation: 447 R Axis:   47  Text Interpretation: Sinus rhythm with 1st degree A-V block Septal infarct (cited on or before 31-Jul-2024) Abnormal ECG When compared with ECG of 28-Jul-2023 15:42, QRS axis Shifted right Confirmed by Edwyna Backers [phone]) on 07/31/2024 3:20:11 PM     Recent Labs: No results found for requested labs within last 365 days.  Recent Lipid Panel No results found for: CHOL, TRIG, HDL, CHOLHDL, VLDL, LDLCALC, LDLDIRECT  Physical Exam:    VS:  BP (!) 166/68   Pulse 69   Ht 5' 6  (1.676 m)   Wt 160 lb 12.8 oz (72.9 kg)   SpO2 95%   BMI 25.95 kg/m     Wt Readings from Last 3 Encounters:  07/31/24 160 lb 12.8 oz (72.9 kg)  07/28/23 163 lb 6.4 oz (74.1 kg)  05/20/22 163 lb (73.9 kg)     GEN: Patient is in no acute distress HEENT: Normal NECK: No JVD; No carotid bruits LYMPHATICS: No lymphadenopathy CARDIAC: Hear sounds regular, 2/6 systolic murmur at the apex. RESPIRATORY:  Clear to auscultation without rales, wheezing or rhonchi  ABDOMEN: Soft, non-tender, non-distended MUSCULOSKELETAL:  No edema; No deformity  SKIN: Warm and dry NEUROLOGIC:  Alert and oriented x 3 PSYCHIATRIC:  Normal affect   Signed, Backers JONELLE Edwyna, MD  07/31/2024 3:34 PM  [facility]

## 2024-07-31 NOTE — Patient Instructions (Signed)
 Medication Instructions:  Your physician recommends that you continue on your current medications as directed. Please refer to the Current Medication list given to you today.  *If you need a refill on your cardiac medications before your next appointment, please call your pharmacy*   Lab Work: None ordered If you have labs (blood work) drawn today and your tests are completely normal, you will receive your results only by: MyChart Message (if you have MyChart) OR A paper copy in the mail If you have any lab test that is abnormal or we need to change your treatment, we will call you to review the results.   Testing/Procedures: A zio monitor was ordered today. It will remain on for 14 days. Remove 08/14/2024. You will then return monitor and event diary in provided box. It takes 1-2 weeks for report to be downloaded and returned to us . We will call you with the results. If monitor falls off or has orange flashing light, please call Zio for further instructions.    Follow-Up: At [facility], you and your health needs are our priority.  As part of our continuing mission to provide you with exceptional heart care, we have created designated Provider Care Teams.  These Care Teams include your primary Cardiologist (physician) and Advanced Practice Providers (APPs -  Physician Assistants and Nurse Practitioners) who all work together to provide you with the care you need, when you need it.  We recommend signing up for the patient portal called MyChart.  Sign up information is provided on this After Visit Summary.  MyChart is used to connect with patients for Virtual Visits (Telemedicine).  Patients are able to view lab/test results, encounter notes, upcoming appointments, etc.  Non-urgent messages can be sent to your provider as well.   To learn more about what you can do with MyChart, go to [email or web address].    Your next appointment:   12 month(s)  The format for your next  appointment:   In Person  Provider:   Jennifer Crape, MD    Other Instructions none  Important Information About Sugar

## 2024-08-06 DIAGNOSIS — M25511 Pain in right shoulder: Secondary | ICD-10-CM | POA: Diagnosis not present

## 2024-08-06 DIAGNOSIS — H6001 Abscess of right external ear: Secondary | ICD-10-CM | POA: Diagnosis not present

## 2024-08-06 DIAGNOSIS — S42291A Other displaced fracture of upper end of right humerus, initial encounter for closed fracture: Secondary | ICD-10-CM | POA: Diagnosis not present

## 2024-08-06 DIAGNOSIS — M62521 Muscle wasting and atrophy, not elsewhere classified, right upper arm: Secondary | ICD-10-CM | POA: Diagnosis not present

## 2024-08-06 DIAGNOSIS — R29898 Other symptoms and signs involving the musculoskeletal system: Secondary | ICD-10-CM | POA: Diagnosis not present

## 2024-08-07 DIAGNOSIS — S42291A Other displaced fracture of upper end of right humerus, initial encounter for closed fracture: Secondary | ICD-10-CM | POA: Diagnosis not present

## 2024-08-09 DIAGNOSIS — M62521 Muscle wasting and atrophy, not elsewhere classified, right upper arm: Secondary | ICD-10-CM | POA: Diagnosis not present

## 2024-08-09 DIAGNOSIS — R29898 Other symptoms and signs involving the musculoskeletal system: Secondary | ICD-10-CM | POA: Diagnosis not present

## 2024-08-09 DIAGNOSIS — S42291A Other displaced fracture of upper end of right humerus, initial encounter for closed fracture: Secondary | ICD-10-CM | POA: Diagnosis not present

## 2024-08-09 DIAGNOSIS — M25511 Pain in right shoulder: Secondary | ICD-10-CM | POA: Diagnosis not present

## 2024-08-15 DIAGNOSIS — M8589 Other specified disorders of bone density and structure, multiple sites: Secondary | ICD-10-CM | POA: Diagnosis not present

## 2024-08-16 DIAGNOSIS — M8589 Other specified disorders of bone density and structure, multiple sites: Secondary | ICD-10-CM | POA: Diagnosis not present

## 2024-08-23 DIAGNOSIS — R002 Palpitations: Secondary | ICD-10-CM | POA: Diagnosis not present

## 2024-08-24 ENCOUNTER — Telehealth: Payer: Self-pay | Admitting: Medical

## 2024-08-24 NOTE — Telephone Encounter (Signed)
 [facility] called to report symptomatic 2nd degree Mobitz type 2 on heart monitor. Appears results are in the chart. I called the patient and she reported during these episodes she felt leg weakness, heart racing and SOB. Has been feeling good the last week. She is not on a BB. I let her know office should be in touch with results and a plan.

## 2024-08-26 ENCOUNTER — Telehealth: Payer: Self-pay

## 2024-08-26 DIAGNOSIS — S42291A Other displaced fracture of upper end of right humerus, initial encounter for closed fracture: Secondary | ICD-10-CM | POA: Diagnosis not present

## 2024-08-26 DIAGNOSIS — M62521 Muscle wasting and atrophy, not elsewhere classified, right upper arm: Secondary | ICD-10-CM | POA: Diagnosis not present

## 2024-08-26 DIAGNOSIS — R29898 Other symptoms and signs involving the musculoskeletal system: Secondary | ICD-10-CM | POA: Diagnosis not present

## 2024-08-26 DIAGNOSIS — M25511 Pain in right shoulder: Secondary | ICD-10-CM | POA: Diagnosis not present

## 2024-08-26 NOTE — Telephone Encounter (Signed)
 Spoke with pt. Advised per Dr. Posey note to Decrease Metoprolol  to 50mg  daily and keep a log of her blood pressures to report. She denied any dizziness or lightheadedness.

## 2024-08-27 ENCOUNTER — Other Ambulatory Visit: Payer: Self-pay | Admitting: Emergency Medicine

## 2024-08-27 MED ORDER — METOPROLOL SUCCINATE ER 50 MG PO TB24: 50.0000 mg | ORAL_TABLET | Freq: Every day | ORAL | 3 refills | Status: DC

## 2024-08-28 ENCOUNTER — Ambulatory Visit: Payer: Self-pay | Admitting: Cardiology

## 2024-08-28 DIAGNOSIS — R002 Palpitations: Secondary | ICD-10-CM

## 2024-08-29 DIAGNOSIS — R911 Solitary pulmonary nodule: Secondary | ICD-10-CM | POA: Diagnosis not present

## 2024-08-29 DIAGNOSIS — I2510 Atherosclerotic heart disease of native coronary artery without angina pectoris: Secondary | ICD-10-CM | POA: Diagnosis not present

## 2024-08-29 DIAGNOSIS — M62521 Muscle wasting and atrophy, not elsewhere classified, right upper arm: Secondary | ICD-10-CM | POA: Diagnosis not present

## 2024-08-29 DIAGNOSIS — I77819 Aortic ectasia, unspecified site: Secondary | ICD-10-CM | POA: Diagnosis not present

## 2024-08-29 DIAGNOSIS — I700 Atherosclerosis of aorta: Secondary | ICD-10-CM | POA: Diagnosis not present

## 2024-08-29 DIAGNOSIS — R29898 Other symptoms and signs involving the musculoskeletal system: Secondary | ICD-10-CM | POA: Diagnosis not present

## 2024-08-29 DIAGNOSIS — M25511 Pain in right shoulder: Secondary | ICD-10-CM | POA: Diagnosis not present

## 2024-08-29 DIAGNOSIS — S42291A Other displaced fracture of upper end of right humerus, initial encounter for closed fracture: Secondary | ICD-10-CM | POA: Diagnosis not present

## 2024-09-05 NOTE — Telephone Encounter (Signed)
 08-26-24 note  Spoke with pt. Advised per Dr. Posey note to Decrease Metoprolol  to 50mg  daily and keep a log of her blood pressures to report. She denied any dizziness or lightheadedness.

## 2024-09-11 ENCOUNTER — Encounter: Payer: Self-pay | Admitting: Emergency Medicine

## 2024-09-11 ENCOUNTER — Other Ambulatory Visit: Payer: Self-pay | Admitting: Emergency Medicine

## 2024-09-11 DIAGNOSIS — M25562 Pain in left knee: Secondary | ICD-10-CM

## 2024-09-12 ENCOUNTER — Ambulatory Visit
Admission: RE | Admit: 2024-09-12 | Discharge: 2024-09-12 | Disposition: A | Source: Ambulatory Visit | Attending: Emergency Medicine | Admitting: Emergency Medicine

## 2024-09-12 DIAGNOSIS — M25562 Pain in left knee: Secondary | ICD-10-CM

## 2024-09-14 ENCOUNTER — Other Ambulatory Visit: Payer: Self-pay

## 2024-09-14 ENCOUNTER — Inpatient Hospital Stay (HOSPITAL_COMMUNITY)
Admission: EM | Admit: 2024-09-14 | Source: Home / Self Care | Attending: Internal Medicine | Admitting: Internal Medicine

## 2024-09-14 ENCOUNTER — Inpatient Hospital Stay (HOSPITAL_COMMUNITY)

## 2024-09-14 ENCOUNTER — Emergency Department (HOSPITAL_COMMUNITY)

## 2024-09-14 DIAGNOSIS — I4891 Unspecified atrial fibrillation: Secondary | ICD-10-CM | POA: Diagnosis not present

## 2024-09-14 DIAGNOSIS — E611 Iron deficiency: Secondary | ICD-10-CM | POA: Diagnosis present

## 2024-09-14 DIAGNOSIS — N179 Acute kidney failure, unspecified: Secondary | ICD-10-CM | POA: Diagnosis present

## 2024-09-14 DIAGNOSIS — J9601 Acute respiratory failure with hypoxia: Secondary | ICD-10-CM | POA: Diagnosis present

## 2024-09-14 DIAGNOSIS — I480 Paroxysmal atrial fibrillation: Secondary | ICD-10-CM | POA: Diagnosis present

## 2024-09-14 DIAGNOSIS — M7052 Other bursitis of knee, left knee: Secondary | ICD-10-CM | POA: Diagnosis not present

## 2024-09-14 DIAGNOSIS — I361 Nonrheumatic tricuspid (valve) insufficiency: Secondary | ICD-10-CM | POA: Diagnosis not present

## 2024-09-14 DIAGNOSIS — I130 Hypertensive heart and chronic kidney disease with heart failure and stage 1 through stage 4 chronic kidney disease, or unspecified chronic kidney disease: Secondary | ICD-10-CM | POA: Diagnosis present

## 2024-09-14 DIAGNOSIS — Z91041 Radiographic dye allergy status: Secondary | ICD-10-CM

## 2024-09-14 DIAGNOSIS — I2510 Atherosclerotic heart disease of native coronary artery without angina pectoris: Secondary | ICD-10-CM | POA: Diagnosis present

## 2024-09-14 DIAGNOSIS — I214 Non-ST elevation (NSTEMI) myocardial infarction: Principal | ICD-10-CM | POA: Diagnosis present

## 2024-09-14 DIAGNOSIS — Z1152 Encounter for screening for COVID-19: Secondary | ICD-10-CM | POA: Diagnosis not present

## 2024-09-14 DIAGNOSIS — W19XXXA Unspecified fall, initial encounter: Secondary | ICD-10-CM | POA: Diagnosis present

## 2024-09-14 DIAGNOSIS — Z7901 Long term (current) use of anticoagulants: Secondary | ICD-10-CM

## 2024-09-14 DIAGNOSIS — R9431 Abnormal electrocardiogram [ECG] [EKG]: Secondary | ICD-10-CM | POA: Diagnosis not present

## 2024-09-14 DIAGNOSIS — T501X6A Underdosing of loop [high-ceiling] diuretics, initial encounter: Secondary | ICD-10-CM | POA: Diagnosis present

## 2024-09-14 DIAGNOSIS — I5032 Chronic diastolic (congestive) heart failure: Secondary | ICD-10-CM

## 2024-09-14 DIAGNOSIS — E876 Hypokalemia: Secondary | ICD-10-CM | POA: Diagnosis not present

## 2024-09-14 DIAGNOSIS — I161 Hypertensive emergency: Secondary | ICD-10-CM | POA: Diagnosis present

## 2024-09-14 DIAGNOSIS — I350 Nonrheumatic aortic (valve) stenosis: Secondary | ICD-10-CM | POA: Diagnosis present

## 2024-09-14 DIAGNOSIS — N189 Chronic kidney disease, unspecified: Secondary | ICD-10-CM | POA: Diagnosis not present

## 2024-09-14 DIAGNOSIS — I509 Heart failure, unspecified: Secondary | ICD-10-CM

## 2024-09-14 DIAGNOSIS — A419 Sepsis, unspecified organism: Secondary | ICD-10-CM | POA: Insufficient documentation

## 2024-09-14 DIAGNOSIS — M7989 Other specified soft tissue disorders: Secondary | ICD-10-CM | POA: Diagnosis present

## 2024-09-14 DIAGNOSIS — J189 Pneumonia, unspecified organism: Secondary | ICD-10-CM | POA: Diagnosis present

## 2024-09-14 DIAGNOSIS — S8012XA Contusion of left lower leg, initial encounter: Secondary | ICD-10-CM | POA: Diagnosis present

## 2024-09-14 DIAGNOSIS — Z885 Allergy status to narcotic agent status: Secondary | ICD-10-CM

## 2024-09-14 DIAGNOSIS — Z66 Do not resuscitate: Secondary | ICD-10-CM | POA: Diagnosis present

## 2024-09-14 DIAGNOSIS — E872 Acidosis, unspecified: Secondary | ICD-10-CM | POA: Diagnosis present

## 2024-09-14 DIAGNOSIS — Z789 Other specified health status: Secondary | ICD-10-CM

## 2024-09-14 DIAGNOSIS — N390 Urinary tract infection, site not specified: Secondary | ICD-10-CM | POA: Diagnosis present

## 2024-09-14 DIAGNOSIS — R0603 Acute respiratory distress: Secondary | ICD-10-CM | POA: Diagnosis present

## 2024-09-14 DIAGNOSIS — R7989 Other specified abnormal findings of blood chemistry: Secondary | ICD-10-CM

## 2024-09-14 DIAGNOSIS — R570 Cardiogenic shock: Secondary | ICD-10-CM | POA: Diagnosis not present

## 2024-09-14 DIAGNOSIS — E785 Hyperlipidemia, unspecified: Secondary | ICD-10-CM | POA: Diagnosis present

## 2024-09-14 DIAGNOSIS — I340 Nonrheumatic mitral (valve) insufficiency: Secondary | ICD-10-CM | POA: Diagnosis not present

## 2024-09-14 DIAGNOSIS — Z955 Presence of coronary angioplasty implant and graft: Secondary | ICD-10-CM | POA: Diagnosis not present

## 2024-09-14 DIAGNOSIS — Z7902 Long term (current) use of antithrombotics/antiplatelets: Secondary | ICD-10-CM | POA: Diagnosis not present

## 2024-09-14 DIAGNOSIS — R079 Chest pain, unspecified: Secondary | ICD-10-CM

## 2024-09-14 DIAGNOSIS — B964 Proteus (mirabilis) (morganii) as the cause of diseases classified elsewhere: Secondary | ICD-10-CM | POA: Diagnosis present

## 2024-09-14 DIAGNOSIS — E8721 Acute metabolic acidosis: Secondary | ICD-10-CM | POA: Diagnosis not present

## 2024-09-14 DIAGNOSIS — I441 Atrioventricular block, second degree: Secondary | ICD-10-CM | POA: Diagnosis present

## 2024-09-14 DIAGNOSIS — I249 Acute ischemic heart disease, unspecified: Secondary | ICD-10-CM | POA: Diagnosis present

## 2024-09-14 DIAGNOSIS — I482 Chronic atrial fibrillation, unspecified: Secondary | ICD-10-CM | POA: Diagnosis present

## 2024-09-14 DIAGNOSIS — Z91148 Patient's other noncompliance with medication regimen for other reason: Secondary | ICD-10-CM

## 2024-09-14 DIAGNOSIS — R000 Tachycardia, unspecified: Secondary | ICD-10-CM | POA: Diagnosis not present

## 2024-09-14 DIAGNOSIS — F419 Anxiety disorder, unspecified: Secondary | ICD-10-CM | POA: Diagnosis present

## 2024-09-14 DIAGNOSIS — N1832 Chronic kidney disease, stage 3b: Secondary | ICD-10-CM | POA: Diagnosis present

## 2024-09-14 DIAGNOSIS — I10 Essential (primary) hypertension: Secondary | ICD-10-CM | POA: Diagnosis not present

## 2024-09-14 DIAGNOSIS — R001 Bradycardia, unspecified: Secondary | ICD-10-CM | POA: Diagnosis present

## 2024-09-14 DIAGNOSIS — J918 Pleural effusion in other conditions classified elsewhere: Secondary | ICD-10-CM | POA: Diagnosis present

## 2024-09-14 DIAGNOSIS — I5033 Acute on chronic diastolic (congestive) heart failure: Secondary | ICD-10-CM | POA: Diagnosis present

## 2024-09-14 DIAGNOSIS — R579 Shock, unspecified: Secondary | ICD-10-CM | POA: Diagnosis not present

## 2024-09-14 DIAGNOSIS — Z90710 Acquired absence of both cervix and uterus: Secondary | ICD-10-CM

## 2024-09-14 DIAGNOSIS — Z79899 Other long term (current) drug therapy: Secondary | ICD-10-CM

## 2024-09-14 DIAGNOSIS — Z8249 Family history of ischemic heart disease and other diseases of the circulatory system: Secondary | ICD-10-CM

## 2024-09-14 DIAGNOSIS — Z882 Allergy status to sulfonamides status: Secondary | ICD-10-CM

## 2024-09-14 DIAGNOSIS — Z9049 Acquired absence of other specified parts of digestive tract: Secondary | ICD-10-CM

## 2024-09-14 DIAGNOSIS — D631 Anemia in chronic kidney disease: Secondary | ICD-10-CM | POA: Diagnosis not present

## 2024-09-14 DIAGNOSIS — I5031 Acute diastolic (congestive) heart failure: Secondary | ICD-10-CM | POA: Diagnosis not present

## 2024-09-14 DIAGNOSIS — Z7982 Long term (current) use of aspirin: Secondary | ICD-10-CM

## 2024-09-14 DIAGNOSIS — I2109 ST elevation (STEMI) myocardial infarction involving other coronary artery of anterior wall: Secondary | ICD-10-CM | POA: Diagnosis not present

## 2024-09-14 DIAGNOSIS — D62 Acute posthemorrhagic anemia: Secondary | ICD-10-CM | POA: Diagnosis not present

## 2024-09-14 HISTORY — DX: Chest pain, unspecified: R07.9

## 2024-09-14 HISTORY — DX: Pneumonia, unspecified organism: J18.9

## 2024-09-14 HISTORY — DX: Acute respiratory distress: R06.03

## 2024-09-14 HISTORY — DX: Sepsis, unspecified organism: A41.9

## 2024-09-14 HISTORY — DX: Other specified health status: Z78.9

## 2024-09-14 LAB — TROPONIN T, HIGH SENSITIVITY
Troponin T High Sensitivity: 195 ng/L (ref 0–19)
Troponin T High Sensitivity: 198 ng/L (ref 0–19)
Troponin T High Sensitivity: 254 ng/L (ref 0–19)
Troponin T High Sensitivity: 235 ng/L (ref 0–19)

## 2024-09-14 LAB — COMPREHENSIVE METABOLIC PANEL WITH GFR
ALT: 10 U/L (ref 0–44)
AST: 24 U/L (ref 15–41)
Albumin: 3.8 g/dL (ref 3.5–5.0)
Alkaline Phosphatase: 85 U/L (ref 38–126)
Anion gap: 15 (ref 5–15)
BUN: 53 mg/dL — ABNORMAL HIGH (ref 8–23)
CO2: 21 mmol/L — ABNORMAL LOW (ref 22–32)
Calcium: 8.6 mg/dL — ABNORMAL LOW (ref 8.9–10.3)
Chloride: 96 mmol/L — ABNORMAL LOW (ref 98–111)
Creatinine, Ser: 1.9 mg/dL — ABNORMAL HIGH (ref 0.44–1.00)
GFR, Estimated: 25 mL/min — ABNORMAL LOW
Glucose, Bld: 143 mg/dL — ABNORMAL HIGH (ref 70–99)
Potassium: 4.6 mmol/L (ref 3.5–5.1)
Sodium: 132 mmol/L — ABNORMAL LOW (ref 135–145)
Total Bilirubin: 0.8 mg/dL (ref 0.0–1.2)
Total Protein: 6.6 g/dL (ref 6.5–8.1)

## 2024-09-14 LAB — URINALYSIS, W/ REFLEX TO CULTURE (INFECTION SUSPECTED)
Bilirubin Urine: NEGATIVE
Glucose, UA: NEGATIVE mg/dL
Ketones, ur: NEGATIVE mg/dL
Nitrite: NEGATIVE
Protein, ur: 100 mg/dL — AB
Specific Gravity, Urine: 1.02 (ref 1.005–1.030)
WBC, UA: >50 WBC/hpf (ref 0–5)
pH: 7 (ref 5.0–8.0)

## 2024-09-14 LAB — CBC WITH DIFFERENTIAL/PLATELET
Abs Immature Granulocytes: 1.35 K/uL — ABNORMAL HIGH (ref 0.00–0.07)
Basophils Absolute: 0 K/uL (ref 0.0–0.1)
Basophils Relative: 0 %
Eosinophils Absolute: 0 K/uL (ref 0.0–0.5)
Eosinophils Relative: 0 %
HCT: 29.3 % — ABNORMAL LOW (ref 36.0–46.0)
Hemoglobin: 9.2 g/dL — ABNORMAL LOW (ref 12.0–15.0)
Immature Granulocytes: 6 %
Lymphocytes Relative: 6 %
Lymphs Abs: 1.4 K/uL (ref 0.7–4.0)
MCH: 28.5 pg (ref 26.0–34.0)
MCHC: 31.4 g/dL (ref 30.0–36.0)
MCV: 90.7 fL (ref 80.0–100.0)
Monocytes Absolute: 4.6 K/uL — ABNORMAL HIGH (ref 0.1–1.0)
Monocytes Relative: 20 %
Neutro Abs: 16.1 K/uL — ABNORMAL HIGH (ref 1.7–7.7)
Neutrophils Relative %: 68 %
Platelets: 193 K/uL (ref 150–400)
RBC: 3.23 MIL/uL — ABNORMAL LOW (ref 3.87–5.11)
RDW: 15 % (ref 11.5–15.5)
WBC: 23.5 K/uL — ABNORMAL HIGH (ref 4.0–10.5)
nRBC: 0 % (ref 0.0–0.2)

## 2024-09-14 LAB — ECHOCARDIOGRAM COMPLETE
Calc EF: 57.2 %
Height: 66 in
MV VTI: 1.12 cm2
S' Lateral: 3.4 cm
Single Plane A2C EF: 60.9 %
Single Plane A4C EF: 53.7 %
Weight: 2529.12 [oz_av]

## 2024-09-14 LAB — HEPARIN LEVEL (UNFRACTIONATED): Heparin Unfractionated: <0.1 [IU]/mL — ABNORMAL LOW (ref 0.30–0.70)

## 2024-09-14 LAB — PRO BRAIN NATRIURETIC PEPTIDE: Pro Brain Natriuretic Peptide: 18334 pg/mL — ABNORMAL HIGH

## 2024-09-14 LAB — RESP PANEL BY RT-PCR (RSV, FLU A&B, COVID)  RVPGX2
Influenza A by PCR: NEGATIVE
Influenza B by PCR: NEGATIVE
Resp Syncytial Virus by PCR: NEGATIVE
SARS Coronavirus 2 by RT PCR: NEGATIVE

## 2024-09-14 LAB — I-STAT CG4 LACTIC ACID, ED
Lactic Acid, Venous: 0.9 mmol/L (ref 0.5–1.9)
Lactic Acid, Venous: 1.9 mmol/L (ref 0.5–1.9)

## 2024-09-14 LAB — APTT: aPTT: 32 s (ref 24–36)

## 2024-09-14 LAB — TSH: TSH: 3.91 u[IU]/mL (ref 0.350–4.500)

## 2024-09-14 MED ORDER — LACTATED RINGERS IV BOLUS (SEPSIS)
500.0000 mL | Freq: Once | INTRAVENOUS | Status: DC
  Administered 2024-09-14: 500 mL via INTRAVENOUS

## 2024-09-14 MED ORDER — HEPARIN (PORCINE) 25000 UT/250ML-% IV SOLN
1350.0000 [IU]/h | INTRAVENOUS | Status: DC
  Administered 2024-09-14: 850 [IU]/h via INTRAVENOUS
  Filled 2024-09-14: qty 250

## 2024-09-14 MED ORDER — SODIUM CHLORIDE 0.9 % IV SOLN
2.0000 g | Freq: Once | INTRAVENOUS | Status: AC
  Administered 2024-09-14: 2 g via INTRAVENOUS
  Filled 2024-09-14: qty 20

## 2024-09-14 MED ORDER — SODIUM CHLORIDE 0.9 % IV SOLN
500.0000 mg | INTRAVENOUS | Status: AC
  Administered 2024-09-15 – 2024-09-16 (×2): 500 mg via INTRAVENOUS
  Filled 2024-09-14 (×2): qty 5

## 2024-09-14 MED ORDER — SODIUM CHLORIDE 0.9 % IV SOLN
500.0000 mg | Freq: Once | INTRAVENOUS | Status: AC
  Administered 2024-09-14: 500 mg via INTRAVENOUS
  Filled 2024-09-14: qty 5

## 2024-09-14 MED ORDER — PERFLUTREN LIPID MICROSPHERE
1.0000 mL | INTRAVENOUS | Status: AC | PRN
  Administered 2024-09-14: 4 mL via INTRAVENOUS

## 2024-09-14 MED ORDER — ENOXAPARIN SODIUM 30 MG/0.3ML IJ SOSY: 30.0000 mg | PREFILLED_SYRINGE | INTRAMUSCULAR | Status: DC

## 2024-09-14 MED ORDER — LOSARTAN POTASSIUM 50 MG PO TABS: 100.0000 mg | ORAL_TABLET | Freq: Every day | ORAL | Status: DC

## 2024-09-14 MED ORDER — LACTATED RINGERS IV SOLN: INTRAVENOUS | Status: DC

## 2024-09-14 MED ORDER — METOPROLOL SUCCINATE ER 50 MG PO TB24
50.0000 mg | ORAL_TABLET | Freq: Every day | ORAL | Status: DC
  Filled 2024-09-14: qty 1

## 2024-09-14 MED ORDER — SODIUM CHLORIDE 0.9 % IV SOLN
2.0000 g | INTRAVENOUS | Status: DC
  Administered 2024-09-15: 2 g via INTRAVENOUS
  Filled 2024-09-14: qty 20

## 2024-09-14 MED ORDER — FUROSEMIDE 10 MG/ML IJ SOLN
40.0000 mg | Freq: Once | INTRAMUSCULAR | Status: AC
  Administered 2024-09-14: 40 mg via INTRAVENOUS
  Filled 2024-09-14: qty 4

## 2024-09-14 MED ORDER — FUROSEMIDE 10 MG/ML IJ SOLN
20.0000 mg | Freq: Once | INTRAMUSCULAR | Status: AC
  Administered 2024-09-14: 20 mg via INTRAVENOUS
  Filled 2024-09-14: qty 2

## 2024-09-14 NOTE — ED Notes (Signed)
ECHO at [facility].

## 2024-09-14 NOTE — ED Notes (Signed)
 Phlebotomy at [facility]

## 2024-09-14 NOTE — Plan of Care (Signed)
 Spoke to radiologist on call regarding utility of V/Q scan given patients underlying pneumonia and abnormal chest xray. Per Radiology MD, there is low yield for a V/Q scan if there is significant abnormal CXR findings as in this case. Will continue with DVT ultrasound and hold off on V/Q scan. Can reconsider if patients pneumonia is improving.  - continue DVT US  - continue heparin  gtt - consider V/Q if patient is chest xray normalizes   Gloriann Ogren, MD 5:43 PM

## 2024-09-14 NOTE — Progress Notes (Signed)
 Echocardiogram 2D Echocardiogram has been performed.  Damien FALCON Arlet Marter RDCS 09/14/2024, 2:20 PM  Cards notified of stat

## 2024-09-14 NOTE — ED Notes (Signed)
 Pt. Bumped from 5L [place] to 7L [place]

## 2024-09-14 NOTE — Assessment & Plan Note (Deleted)
 Differential includes PNA, CHF exacerbation given CXR findings and proBNP. No significant volume overload on exam.

## 2024-09-14 NOTE — ED Notes (Signed)
 CCMD called; Pt. Is on monitor

## 2024-09-14 NOTE — ED Notes (Signed)
 Pt. Bumped from 7L to 10L [place]

## 2024-09-14 NOTE — ED Provider Notes (Signed)
 "  Emergency Department Provider Note   I have reviewed the triage vital signs and the nursing notes.   HISTORY  Chief Complaint Shortness of Breath   HPI Alyssa Hudson is a 83 y.o. female with past history reviewed below who presents to the emergency department with shortness of breath.  Patient has had 2 weeks of progressively worsening symptoms.  She feels intermittent palpitations with lightheadedness like she may pass out along with shortness of breath which began as exertional symptoms primarily but now it is more constant.  She had a fall on Tuesday with bruising throughout her left leg.  She followed with [facility] in [place] and was cleared from a fracture standpoint.  Shortness of breath has worsened.  No fevers or chills.  No productive cough.  She denies any chest pain either earlier today or currently.    Past Medical History:  Diagnosis Date   Acute diastolic heart failure (HCC) 06/30/2020   Acute pericarditis 07/24/2020   Aftercare following surgery 06/09/2016   Aortic valve sclerosis 2021   Moderate per echo in 2021   Atrial fibrillation (HCC) 07/24/2020   BMI 33.0-33.9,adult    Carpal tunnel syndrome, right 08/13/2020   Cervical radiculopathy 08/13/2020   Cervical radiculopathy due to osteoarthritis of spine    Diverticulosis    Encephalopathy 06/19/2020   Facial droop    Generalized osteoarthritis of multiple sites    Hypertension    Hyponatremia 06/20/2020   Primary hypertension 07/24/2020   Tricompartment osteoarthritis of right knee    Ulnar neuropathy at elbow, right 08/13/2020   Viral pericarditis with pericardial effusion 06/30/2020    Review of Systems  Constitutional: No fever/chills Cardiovascular: Denies chest pain. Positive palpitations and near syncope.  Respiratory: Positive shortness of breath. Gastrointestinal: No abdominal pain.  No nausea, no vomiting.  Neurological: Negative for  headaches.  ____________________________________________   PHYSICAL EXAM:  VITAL SIGNS: ED Triage Vitals [09/14/24 0935]  Encounter Vitals Group     BP (!) 148/60     Pulse Rate 93     Resp (!) 34     Temp (!) 97.1 F (36.2 C)     Temp Source Oral     SpO2 100 %   Constitutional: Alert and oriented. Well appearing and in no acute distress. Eyes: Conjunctivae are normal.  Head: Atraumatic. Nose: No congestion/rhinnorhea. Mouth/Throat: Mucous membranes are moist.  O Neck: No stridor.   Cardiovascular: Normal rate, regular rhythm. Good peripheral circulation. Grossly normal heart sounds.   Respiratory: Increased respiratory effort.  No retractions. Lungs with faint crackles at the bases. No rhonchi. Gastrointestinal: Soft and nontender. No distention.  Musculoskeletal: No gross deformities of extremities. Neurologic:  Normal speech and language.  Skin:  Skin is warm, dry and intact. No rash noted.  ____________________________________________   LABS (all labs ordered are listed, but only abnormal results are displayed)  Labs Reviewed  URINE CULTURE - Abnormal; Notable for the following components:      Result Value   Culture >=100,000 COLONIES/mL PROTEUS MIRABILIS (*)    Organism ID, Bacteria PROTEUS MIRABILIS (*)    All other components within normal limits  COMPREHENSIVE METABOLIC PANEL WITH GFR - Abnormal; Notable for the following components:   Sodium 132 (*)    Chloride 96 (*)    CO2 21 (*)    Glucose, Bld 143 (*)    BUN 53 (*)    Creatinine, Ser 1.90 (*)    Calcium  8.6 (*)    GFR, Estimated 25 (*)  All other components within normal limits  PRO BRAIN NATRIURETIC PEPTIDE - Abnormal; Notable for the following components:   Pro Brain Natriuretic Peptide 18,334.0 (*)    All other components within normal limits  URINALYSIS, W/ REFLEX TO CULTURE (INFECTION SUSPECTED) - Abnormal; Notable for the following components:   APPearance TURBID (*)    Hgb urine  dipstick TRACE (*)    Protein, ur 100 (*)    Leukocytes,Ua LARGE (*)    Non Squamous Epithelial PRESENT (*)    Bacteria, UA FEW (*)    All other components within normal limits  CBC WITH DIFFERENTIAL/PLATELET - Abnormal; Notable for the following components:   WBC 23.5 (*)    RBC 3.23 (*)    Hemoglobin 9.2 (*)    HCT 29.3 (*)    Neutro Abs 16.1 (*)    Monocytes Absolute 4.6 (*)    Abs Immature Granulocytes 1.35 (*)    All other components within normal limits  HEPARIN  LEVEL (UNFRACTIONATED) - Abnormal; Notable for the following components:   Heparin  Unfractionated <0.10 (*)    All other components within normal limits  CBC - Abnormal; Notable for the following components:   WBC 21.0 (*)    RBC 2.94 (*)    Hemoglobin 8.4 (*)    HCT 26.4 (*)    All other components within normal limits  MAGNESIUM  - Abnormal; Notable for the following components:   Magnesium  2.6 (*)    All other components within normal limits  BASIC METABOLIC PANEL WITH GFR - Abnormal; Notable for the following components:   CO2 19 (*)    Glucose, Bld 114 (*)    BUN 61 (*)    Creatinine, Ser 1.89 (*)    Calcium  8.7 (*)    GFR, Estimated 26 (*)    Anion gap 18 (*)    All other components within normal limits  HEPARIN  LEVEL (UNFRACTIONATED) - Abnormal; Notable for the following components:   Heparin  Unfractionated 0.16 (*)    All other components within normal limits  HEPARIN  LEVEL (UNFRACTIONATED) - Abnormal; Notable for the following components:   Heparin  Unfractionated 0.12 (*)    All other components within normal limits  BLOOD GAS, VENOUS - Abnormal; Notable for the following components:   pCO2, Ven 35 (*)    pO2, Ven 59 (*)    Acid-base deficit 3.8 (*)    All other components within normal limits  LACTIC ACID, PLASMA - Abnormal; Notable for the following components:   Lactic Acid, Venous 3.0 (*)    All other components within normal limits  LACTIC ACID, PLASMA - Abnormal; Notable for the following  components:   Lactic Acid, Venous 2.9 (*)    All other components within normal limits  CBC - Abnormal; Notable for the following components:   WBC 17.0 (*)    RBC 2.99 (*)    Hemoglobin 8.5 (*)    HCT 26.7 (*)    All other components within normal limits  CREATININE, SERUM - Abnormal; Notable for the following components:   Creatinine, Ser 1.95 (*)    GFR, Estimated 25 (*)    All other components within normal limits  CBC - Abnormal; Notable for the following components:   WBC 10.6 (*)    RBC 2.71 (*)    Hemoglobin 7.7 (*)    HCT 24.2 (*)    All other components within normal limits  BASIC METABOLIC PANEL WITH GFR - Abnormal; Notable for the following components:   Glucose,  Bld 164 (*)    BUN 78 (*)    Creatinine, Ser 2.19 (*)    Calcium  8.7 (*)    GFR, Estimated 21 (*)    Anion gap 16 (*)    All other components within normal limits  MAGNESIUM  - Abnormal; Notable for the following components:   Magnesium  2.6 (*)    All other components within normal limits  COOXEMETRY PANEL - Abnormal; Notable for the following components:   Total hemoglobin 7.4 (*)    All other components within normal limits  COOXEMETRY PANEL - Abnormal; Notable for the following components:   Total hemoglobin 7.6 (*)    Carboxyhemoglobin 1.6 (*)    All other components within normal limits  GLUCOSE, CAPILLARY - Abnormal; Notable for the following components:   Glucose-Capillary 152 (*)    All other components within normal limits  CK TOTAL AND CKMB (NOT AT [facility]) - Abnormal; Notable for the following components:   CK, MB 14.6 (*)    All other components within normal limits  GLUCOSE, CAPILLARY - Abnormal; Notable for the following components:   Glucose-Capillary 138 (*)    All other components within normal limits  CBC - Abnormal; Notable for the following components:   RBC 2.77 (*)    Hemoglobin 8.0 (*)    HCT 24.4 (*)    All other components within normal limits  CK TOTAL AND CKMB (NOT AT  [facility]) - Abnormal; Notable for the following components:   CK, MB 5.6 (*)    All other components within normal limits  COMPREHENSIVE METABOLIC PANEL WITH GFR - Abnormal; Notable for the following components:   Glucose, Bld 139 (*)    BUN 102 (*)    Creatinine, Ser 2.56 (*)    Calcium  8.4 (*)    Total Protein 5.4 (*)    Albumin 3.1 (*)    GFR, Estimated 18 (*)    All other components within normal limits  GLUCOSE, CAPILLARY - Abnormal; Notable for the following components:   Glucose-Capillary 142 (*)    All other components within normal limits  GLUCOSE, CAPILLARY - Abnormal; Notable for the following components:   Glucose-Capillary 157 (*)    All other components within normal limits  GLUCOSE, CAPILLARY - Abnormal; Notable for the following components:   Glucose-Capillary 149 (*)    All other components within normal limits  GLUCOSE, CAPILLARY - Abnormal; Notable for the following components:   Glucose-Capillary 147 (*)    All other components within normal limits  GLUCOSE, CAPILLARY - Abnormal; Notable for the following components:   Glucose-Capillary 107 (*)    All other components within normal limits  POCT I-STAT 7, (LYTES, BLD GAS, ICA,H+H) - Abnormal; Notable for the following components:   pH, Arterial 7.346 (*)    pO2, Arterial 322 (*)    Bicarbonate 19.7 (*)    TCO2 21 (*)    Acid-base deficit 6.0 (*)    Sodium 133 (*)    Calcium , Ion 1.10 (*)    HCT 24.0 (*)    Hemoglobin 8.2 (*)    All other components within normal limits  TROPONIN T, HIGH SENSITIVITY - Abnormal; Notable for the following components:   Troponin T High Sensitivity 195 (*)    All other components within normal limits  TROPONIN T, HIGH SENSITIVITY - Abnormal; Notable for the following components:   Troponin T High Sensitivity 198 (*)    All other components within normal limits  TROPONIN T, HIGH SENSITIVITY -  Abnormal; Notable for the following components:   Troponin T High Sensitivity 235 (*)     All other components within normal limits  TROPONIN T, HIGH SENSITIVITY - Abnormal; Notable for the following components:   Troponin T High Sensitivity 254 (*)    All other components within normal limits  TROPONIN T, HIGH SENSITIVITY - Abnormal; Notable for the following components:   Troponin T High Sensitivity 331 (*)    All other components within normal limits  TROPONIN T, HIGH SENSITIVITY - Abnormal; Notable for the following components:   Troponin T High Sensitivity 742 (*)    All other components within normal limits  RESP PANEL BY RT-PCR (RSV, FLU A&B, COVID)  RVPGX2  CULTURE, BLOOD (ROUTINE X 2)  CULTURE, BLOOD (ROUTINE X 2)  CULTURE, RESPIRATORY W GRAM STAIN  MRSA NEXT GEN BY PCR, NASAL  RESPIRATORY PANEL BY PCR  TSH  APTT  PROCALCITONIN  MYOGLOBIN, URINE  PROCALCITONIN  STREP PNEUMONIAE URINARY ANTIGEN  PROCALCITONIN  LEGIONELLA PNEUMOPHILA SEROGP 1 UR AG  CK TOTAL AND CKMB (NOT AT [facility])  I-STAT CG4 LACTIC ACID, ED  I-STAT CG4 LACTIC ACID, ED  POCT ACTIVATED CLOTTING TIME  POCT ACTIVATED CLOTTING TIME  POCT ACTIVATED CLOTTING TIME  CG4 I-STAT (LACTIC ACID)  I-STAT CG4 LACTIC ACID, ED  I-STAT CG4 LACTIC ACID, ED  TYPE AND SCREEN  PREPARE RBC (CROSSMATCH)  ABO/RH   ____________________________________________  EKG   EKG Interpretation Date/Time:  Saturday September 14 2024 14:16:44 EST Ventricular Rate:  75 PR Interval:  169 QRS Duration:  101 QT Interval:  504 QTC Calculation: 563 R Axis:   30  Text Interpretation: Ectopic atrial rhythm LVH with secondary repolarization abnormality Anterior Q waves, possibly due to LVH Prolonged QT interval Since last tracing rate slower Otherwise no significant change Confirmed by Emil Share [phone]) on 09/15/2024 7:32:50 AM        ____________________________________________  RADIOLOGY  DG Chest Portable 1 View Result Date: 09/14/2024 CLINICAL DATA:  Shortness of breath. EXAM: PORTABLE CHEST 1 VIEW  COMPARISON:  Chest radiograph dated 06/29/2020. FINDINGS: Bilateral patchy and confluent airspace opacities, predominantly involving the mid to lower lung field, and right greater than left consistent with pneumonia. Small bilateral pleural effusions. No pneumothorax. The cardiac silhouette is within normal limits. Osteopenia with degenerative changes of the spine. Chronic appearing fracture of the right humeral neck. No acute osseous pathology. IMPRESSION: Bilateral pneumonia. Follow-up to resolution recommended. Electronically Signed   By: Vanetta Chou M.D.   On: 09/14/2024 10:38    ____________________________________________   PROCEDURES  Procedure(s) performed:   Procedures  CRITICAL CARE Performed by: Fonda KANDICE Law Total critical care time: 35 minutes Critical care time was exclusive of separately billable procedures and treating other patients. Critical care was necessary to treat or prevent imminent or life-threatening deterioration. Critical care was time spent personally by me on the following activities: development of treatment plan with patient and/or surrogate as well as nursing, discussions with consultants, evaluation of patient's response to treatment, examination of patient, obtaining history from patient or surrogate, ordering and performing treatments and interventions, ordering and review of laboratory studies, ordering and review of radiographic studies, pulse oximetry and re-evaluation of patient's condition.  Fonda Law, MD Emergency Medicine  ____________________________________________   INITIAL IMPRESSION / ASSESSMENT AND PLAN / ED COURSE  Pertinent labs & imaging results that were available during my care of the patient were reviewed by me and considered in my medical decision making (see chart for details).  This patient is Presenting for Evaluation of SOB, which does require a range of treatment options, and is a complaint that involves a high risk of  morbidity and mortality.  The Differential Diagnoses includes CAP, COVID, Flu, sepsis, CHF, PE, ACS, etc.  Critical Interventions-    Medications  perflutren  lipid microspheres (DEFINITY ) IV suspension (4 mLs Intravenous Given 09/14/24 1420)  ipratropium-albuterol  (DUONEB) 0.5-2.5 (3) MG/3ML nebulizer solution 3 mL ( Nebulization MAR Unhold 09/15/24 1509)  acetaminophen  (TYLENOL ) tablet 650 mg ( Oral MAR Unhold 09/15/24 1509)  piperacillin -tazobactam (ZOSYN ) IVPB 2.25 g (2.25 g Intravenous New Bag/Given 09/17/24 1339)  nitroGLYCERIN  100 mcg/mL intra-arterial injection (has no administration in time range)  labetalol  (NORMODYNE ) injection 10 mg (has no administration in time range)  hydrALAZINE  (APRESOLINE ) injection 10 mg (has no administration in time range)  sodium chloride  flush (NS) 0.9 % injection 3 mL (3 mLs Intravenous Given 09/17/24 1030)  sodium chloride  flush (NS) 0.9 % injection 3 mL (has no administration in time range)  0.9 %  sodium chloride  infusion ( Intravenous Infusion Verify 09/16/24 1600)  etomidate  (AMIDATE ) 2 MG/ML injection (20 mg  Not Given 09/15/24 1645)  pantoprazole  (PROTONIX ) injection 40 mg (40 mg Intravenous Given 09/16/24 2134)  Chlorhexidine  Gluconate Cloth 2 % PADS 6 each (6 each Topical Given 09/17/24 1030)  Oral care mouth rinse (15 mLs Mouth Rinse Given 09/17/24 1533)  Oral care mouth rinse (has no administration in time range)  haloperidol  lactate (HALDOL ) injection 2-4 mg (has no administration in time range)  0.9 %  sodium chloride  infusion (Manually program via Guardrails IV Fluids) (0 mLs Intravenous Hold 09/16/24 1827)  aspirin  chewable tablet 81 mg (81 mg Oral Given 09/17/24 0900)  clopidogrel  (PLAVIX ) tablet 75 mg (75 mg Oral Given 09/17/24 0835)  polyethylene glycol (MIRALAX  / GLYCOLAX ) packet 17 g (17 g Oral Given 09/17/24 0900)  fentaNYL  (SUBLIMAZE ) injection 25 mcg (has no administration in time range)  feeding supplement (ENSURE PLUS HIGH  PROTEIN) liquid 237 mL (237 mLs Oral Given 09/17/24 1341)  atorvastatin  (LIPITOR) tablet 40 mg (has no administration in time range)  apixaban  (ELIQUIS ) tablet 2.5 mg (2.5 mg Oral Given 09/17/24 1535)  cefTRIAXone  (ROCEPHIN ) 2 g in sodium chloride  0.9 % 100 mL IVPB (0 g Intravenous Stopped 09/14/24 1205)  azithromycin  (ZITHROMAX ) 500 mg in sodium chloride  0.9 % 250 mL IVPB (0 mg Intravenous Stopped 09/14/24 1314)  furosemide  (LASIX ) injection 40 mg (40 mg Intravenous Given 09/14/24 1226)  azithromycin  (ZITHROMAX ) 500 mg in sodium chloride  0.9 % 250 mL IVPB (0 mg Intravenous Stopped 09/16/24 1454)  furosemide  (LASIX ) injection 20 mg (20 mg Intravenous Given 09/14/24 2030)  furosemide  (LASIX ) injection 40 mg (40 mg Intravenous Given 09/15/24 1033)  albuterol  (PROVENTIL ) (2.5 MG/3ML) 0.083% nebulizer solution (2.5 mg  Given 09/15/24 1054)  methylPREDNISolone  sodium succinate (SOLU-MEDROL ) 125 mg/2 mL injection 125 mg (125 mg Intravenous Given 09/15/24 1259)  cangrelor  (KENGREAL ) 50,000 mcg in sodium chloride  0.9 % 250 mL (200 mcg/mL) infusion (0 mcg/kg/min  70.6 kg  Stopped 09/15/24 2300)  free water  250 mL (250 mLs Oral Given 09/15/24 1823)  midazolam  PF (VERSED ) injection 2 mg (0 mg Intravenous Duplicate 09/15/24 1556)  fentaNYL  (SUBLIMAZE ) injection 50 mcg (50 mcg Intravenous Given 09/15/24 1642)  rocuronium  (ZEMURON ) 100 MG/10ML injection (50 mg  Given 09/15/24 1647)  fentaNYL  (SUBLIMAZE ) 50 MCG/ML injection (50 mcg  Given 09/15/24 1644)  midazolam  (VERSED ) 2 MG/2ML injection (2 mg  Given 09/15/24 1643)  fentaNYL  (SUBLIMAZE ) injection 25-50 mcg (50 mcg  Intravenous Given 09/15/24 1816)  clopidogrel  (PLAVIX ) tablet 600 mg (600 mg Per Tube Given 09/15/24 1748)  furosemide  (LASIX ) 120 mg in dextrose  5 % 50 mL IVPB (0 mg Intravenous Paused 09/16/24 1217)    Reassessment after intervention:  patient on 10L [place]   I did obtain Additional Historical Information from family at bedside.    Clinical  Laboratory Tests Ordered, included CBC with leukocytosis to 23.5.  Mild anemia 9.2.  Creatinine 1.9.  UA equivocal for UTI.  proBNP 18,000.  Troponin T minimally elevated to 195, suspect demand ischemia.   Radiologic Tests Ordered, included CXR. I independently interpreted the images and agree with radiology interpretation.   Cardiac Monitor Tracing which shows NSR.    Social Determinants of Health Risk patient is a non-smoker.   Consult complete with Cardiology (Dr. Wonda) on arrival to review EKG. Agrees that given clinical picture, no STEMI at this time.   Family Medicine teaching service.   Medical Decision Making: Summary:  Patient arrives by EMS mainly with shortness of breath and new O2 requirement.  Crackles at the bases.  She has had some associated cough but no significant pneumonia symptoms.  EKG is ischemic but discussed with cardiology upon arrival and doubtful that this represents a primary ACS issue. No STEMI activated. No SIRS criteria. Doubt sepsis clinically at this time but PNA is a consideration.   Reevaluation with update and discussion with patient and family.  She is now down to 10 L nasal cannula and comfortable.  X-ray shows concern for bilateral pneumonia which correlates well with white count of 23.5.  Also with significantly elevated proBNP.  I have stopped the IV fluid infusion but will continue with a small bolus in the setting of presumed infection but also starting Lasix .   01:04 PM  Patient with some increased WOB. Normal O2. No severe distress. Plan to transition to BiPAP.    Patient's presentation is most consistent with acute presentation with potential threat to life or bodily function.   Disposition: admit  ____________________________________________  FINAL CLINICAL IMPRESSION(S) / ED DIAGNOSES  Final diagnoses:  Acute respiratory failure with hypoxia (HCC)    Note:  This document was prepared using Dragon voice recognition software and may  include unintentional dictation errors.  Fonda Law, MD, [facility] Emergency Medicine    Rayfield Beem, Fonda MATSU, MD 09/17/24 1659  "

## 2024-09-14 NOTE — Assessment & Plan Note (Addendum)
 Cr 1.9, last recorded BMP 1.4 1 year ago. Given possible CHF exacerbation will hold further fluids s/p one bolus.  -S/p Lasix  40mg  IV, monitor response  -Strict I/Os -Daily weights AM BMP

## 2024-09-14 NOTE — Plan of Care (Addendum)
 FMTS Brief Progress Note  S: to bedside for night rounds Pt admitted today, 83yo F being managed for SOB iso PNA and concern for ACS vs PE  Currently feeling fine Was able to come off Bipap  Denies concerns including CP/SOB. May be feeling a little anxious   O: BP (!) 149/47 (BP Location: Left Arm)   Pulse (!) 57   Temp 98.4 F (36.9 C) (Oral)   Resp 18   Ht 5' 6 (1.676 m)   Wt 71.7 kg   SpO2 93%   BMI 25.51 kg/m    Awake, alert, responsive Slightly bradycardic regular rhythm CTAB anteriorly LLE ecchymosis at and below knee   A/P:   PNA Now off bipap Abx per HP Monitor pulse ox    ?ACS vs PE Per cardiology less likely ACS Trop trended down Consider VQ but less utility with PNA currently Continue heparin  for now  CHF S/p IV lasix  40 earlier Another IV lasix  20 ordered per cardiology Monitor output  Remainder per HP and consultant note   - Orders reviewed. Labs for AM ordered, which was adjusted as needed.  - If condition changes, plan includes page primary team.   Romelle Booty, MD 09/14/2024, 8:27 PM PGY-3, [facility] Night Resident  Please page [phone] with questions.

## 2024-09-14 NOTE — Hospital Course (Signed)
 Alyssa Hudson is a 83 y.o.female with a history of HFpEF, hypertension who was admitted to the family medicine teaching Service at [facility] for shortness of breath. Her hospital course is detailed below:  SOB  Patient admitted after 2 weeks of worsening shortness of breath.  Patient denies any chest pain but did have some associated palpitations.  Patient also reports decreased appetite.  Patient is on Lasix  however does not take it on the days that she sees PT.  In the ED patient was tachypneic and put on nonrebreather.  White blood cell count elevated.  X-ray concerning for bilateral pneumonia.  EKG initially concerning for ACS with ST depressions, troponins elevated, however cardiology in the ED did not believe this was ACS given no chest pain.  BNP also elevated.  Patient was treated with IV Lasix , oxygen support, and anticoagulation with heparin .  She was started on ceftriaxone  and azithromycin  for CAP coverage.  Blood cultures were obtained which showed***.  DVT ultrasound was ordered for workup of PE as patient had recent fall with left sided swelling and hematoma over her knee.  DVT ultrasound showed***cardiology was consulted who recommended***   UTI AKI Patient's UA consistent with UTI with positive leuks, bacteria, and white blood cells.  Patient's creatinine also elevated to 1.9, baseline appears to be around 1.4.    Other chronic conditions were medically managed with home medications and formulary alternatives as necessary (***)  PCP Follow-up Recommendations:

## 2024-09-14 NOTE — ED Notes (Signed)
 Attempted to insert Iv twice, was unsuccessful; Was able to straight stick for a light green top; NT attempted to straight stick for a lav top, unsuccessful. Phlebotomy made aware that we need a lav top

## 2024-09-14 NOTE — Progress Notes (Signed)
 PHARMACY - ANTICOAGULATION CONSULT NOTE  Pharmacy Consult for heparin  Indication: r/o PE  Labs: Recent Labs    09/14/24 1004 09/14/24 1025 09/14/24 2140  HGB  --  9.2*  --   HCT  --  29.3*  --   PLT  --  193  --   APTT  --   --  32  HEPARINUNFRC  --   --  <0.10*  CREATININE 1.90*  --   --    Assessment: 83yo female subtherapeutic on heparin  with initial dosing for r/o ACS vs PE, cards not concerned for ACS; no infusion issues or signs of bleeding per RN.  Goal of Therapy:  Heparin  level 0.3-0.7 units/ml   Plan:  Increase heparin  infusion by 4 units/kg/hr to 1100 units/hr. Check level in 8 hours.   Marvetta Dauphin, PharmD, BCPS 09/14/2024 11:40 PM

## 2024-09-14 NOTE — Assessment & Plan Note (Addendum)
 Tachypneic on 10L [place], borderline tachycardic, WBC 23. Evidence of PNA b/l on CXR. Bilateral consolidations on CXR.  -Continuous pulse ox -Bcx pending -S/p 1 dose of CTX and azithromycin  -Continue CTX 2g and azithromycin  500mg  q24 AM CBC

## 2024-09-14 NOTE — ED Notes (Signed)
 Phlebotomy at [facility] collecting 2nd set of blood cultures

## 2024-09-14 NOTE — ED Notes (Signed)
 Extra mint green top in lab. KIT

## 2024-09-14 NOTE — ED Triage Notes (Signed)
 Pt. BIB [facility] from home with c/o [facility] and abnormal EKG; Per [facility] the pt. Has had this [facility] for about 2 months but today has been the worst; The pt. Was seen on Tuesday by ortho for a fall that resulted in a hematoma on her L leg; The pt. Is A/O x4  VS per [facility]: HR: 50s then shoot up to 90s BP: 150/52 SpO2: 100% NRB 15L CBG: 198

## 2024-09-14 NOTE — Assessment & Plan Note (Addendum)
 EKG w/ st depressions concerning for ischemia. Troponins 195, 198 in the ED. Likely 2/2 demand ischemia iso sepsis. -Continuous cardiac monitoring -STAT Echo pending -Continue metoprolol   -Patient stated she took two aspirin  this AM before EMS arrived -Start heparin  drip -Cardiology consulted, appreciate their recommendations -DVT US  given concern for possible PE

## 2024-09-14 NOTE — ED Notes (Signed)
 MD at [facility]

## 2024-09-14 NOTE — ED Notes (Signed)
 RT at [facility] setting up Bipap

## 2024-09-14 NOTE — ED Notes (Signed)
 Pt. Was changed out of dirty brief into a clean one

## 2024-09-14 NOTE — Progress Notes (Signed)
 ANTICOAGULATION CONSULT NOTE  Pharmacy Consult for Heparin  Indication: chest pain/ACS  Allergies[1]  Patient Measurements:   Heparin  Dosing Weight: 71.7 kg  Vital Signs: Temp: 97.1 F (36.2 C) (12/20 0935) Temp Source: Oral (12/20 0935) BP: 146/55 (12/20 1140) Pulse Rate: 88 (12/20 1215)  Labs: Recent Labs    09/14/24 1004 09/14/24 1025  HGB  --  9.2*  HCT  --  29.3*  PLT  --  193  CREATININE 1.90*  --     CrCl cannot be calculated (Unknown ideal weight.).   Medical History: Past Medical History:  Diagnosis Date   Acute diastolic heart failure (HCC) 06/30/2020   Acute pericarditis 07/24/2020   Aftercare following surgery 06/09/2016   Aortic valve sclerosis 2021   Moderate per echo in 2021   Atrial fibrillation (HCC) 07/24/2020   BMI 33.0-33.9,adult    Carpal tunnel syndrome, right 08/13/2020   Cervical radiculopathy 08/13/2020   Cervical radiculopathy due to osteoarthritis of spine    Diverticulosis    Encephalopathy 06/19/2020   Facial droop    Generalized osteoarthritis of multiple sites    Hypertension    Hyponatremia 06/20/2020   Primary hypertension 07/24/2020   Tricompartment osteoarthritis of right knee    Ulnar neuropathy at elbow, right 08/13/2020   Viral pericarditis with pericardial effusion 06/30/2020    Medications:  (Not in a hospital admission)  Scheduled:   metoprolol  succinate  50 mg Oral Daily   Infusions:  PRN:   Assessment: 81 yof with a history of HF, HTN. Patient is presenting with SOB. Heparin  per pharmacy consult placed for chest pain/ACS.  Patient is not on anticoagulation prior to arrival.  Hgb 9.2; plt 193  Goal of Therapy:  Heparin  level 0.3-0.7 units/ml Monitor platelets by anticoagulation protocol: Yes   Plan:  Will withhold initial bolus given hgb and age Start heparin  infusion at 850 units/hr Check anti-Xa level in 8 hours and daily while on heparin  Continue to monitor H&H and platelets  Dorn Buttner, PharmD, BCPS 09/14/2024 1:34 PM ED Clinical Pharmacist -  [phone]       [1]  Allergies Allergen Reactions   Codeine Nausea Only and Nausea And Vomiting    Insomnia   Contrast Media [Iodinated Contrast Media] Other (See Comments)    Turned red all over   Iodine-131 Hives and Swelling   Sulfa Antibiotics Nausea Only

## 2024-09-14 NOTE — ED Notes (Signed)
 Pt. Taken off of NRB and put on 5L [place]; Per EDP

## 2024-09-14 NOTE — ED Notes (Signed)
 EDP at [facility]

## 2024-09-14 NOTE — H&P (Signed)
 "    Hospital Admission History and Physical Service Pager: [phone]  Patient name: Alyssa Hudson Medical record number: [id number] Date of Birth: May 17, 1941 Age: 83 y.o. Gender: female  Primary Care Provider: [name], MD Consultants:  Code Status: DNR  Preferred Emergency Contact: Call Alyssa Hudson annemarie Pass Information     Name Relation Home Work Mobile   Alyssa Hudson Spouse [phone]  [phone]   [name], [name]) Son   [phone]   Alyssa Hudson   [id number]      Other Contacts   None on File      Chief Complaint: Dyspnea  Differential and Medical Decision Making:  Alyssa Hudson is a 83 y.o. female presenting with acute on chronically worsening dyspnea.  Differential for this patient's presentation of this includes NSTEMI (elevated Trop, ischemia on EKG, less likely given lack of chest pain, she is endorsing palpitations), PNA (consolidations on CXR, WBC count, however pt with , PE (questionable hx of Afib, borderline tachycardic, O2 requirement, possible source with LLE redness and swelling in the setting of hematoma after fall), and HF exacerbation (record of diastolic dysfunction, couple of days off of her lasix , BNP very elevated, however CXR more concerning for PNA).    Assessment & Plan Sepsis (HCC) Pneumonia Tachypneic on 10L [place], borderline tachycardic, WBC 23. Evidence of PNA b/l on CXR. Bilateral consolidations on CXR.  -Continuous pulse ox -Bcx pending -S/p 1 dose of CTX and azithromycin  -Continue CTX 2g and azithromycin  500mg  q24 AM CBC Acute coronary syndrome (HCC) EKG w/ st depressions concerning for ischemia. Troponins 195, 198 in the ED. Likely 2/2 demand ischemia iso sepsis. -Continuous cardiac monitoring -STAT Echo pending -Continue metoprolol   -Patient stated she took two aspirin  this AM before EMS arrived -Start heparin  drip -Cardiology consulted, appreciate their recommendations -DVT US  given concern  for possible PE AKI (acute kidney injury) Cr 1.9, last recorded BMP 1.4 1 year ago. Given possible CHF exacerbation will hold further fluids s/p one bolus.  -S/p Lasix  40mg  IV, monitor response  -Strict I/Os -Daily weights AM BMP Chronic health problem HTN: Continue home metoprolol , hold losartan    FEN/GI: NPO VTE Prophylaxis: Heparin   Disposition: Progressive  History of Present Illness:  Alyssa Hudson is a 83 y.o. female presenting with progressively worsening shortness of breath and fast heart beat for the past two months. No SOB at rest. She denies chest pain now and at any point this week. Went to primary doctor who said she needed to go to PT for her legs.  She reports her shortness of breath started to become notably worse a little less than a week ago.  She denies any history of asthma or COPD.  She denies fever, chills, cough, sick contacts.  She endorses chronic runny nose.  She then experienced a mechanical fall onto her left knee 3 days ago after tripping over her feet. This caused significant bruising.  She denies having felt dizzy, faint, or hitting her head.  She saw an orthopedic doctor who told her she had a large hematoma around her knee but otherwise told her she was ok.   The last couple of days she reports decreased appetite.  She had a normal bowel movement after taking a couple of senna tablets, and has been urinating slightly less frequently.  She reports that she had not been taking her Lasix  last couple of days because she's been going to see the physical therapist.   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 She doesn't think she has heart failure. She denies hx of heart attack or blood clot.   Per ED sign out, she has a contrast allergy that previously was not responsive to pre-medication.   In the ED, she was  tachypneic in the 30s with significant conversational dyspnea and work of breathing while satting appropriately on 10 L nasal cannula.  She was borderline tachycardic with stable blood pressures.  She was afebrile.  Her EKG was concerning for ischemia with ST depressions in II, III, aVF, V5 and V6, Trop 195 and 198. Her CXR was concerning for b/l pneumonia. Lactate was initially WNL. ProBNP 18334. No significant volume overload appreciated on exam, with crackles predominantly in the R lung bases.   Review Of Systems: Per HPI  Pertinent Past Medical History: No history of heart attacks No blood clots  Remainder reviewed in history tab.   Pertinent Past Surgical History: Carpal Tunnel Release 2022 Hysterectomy 1998 Cholecystectomy 1998  Remainder reviewed in history tab.   Pertinent Social History: Tobacco use: None Alcohol use: None Other Substance use: none Lives with husband  Pertinent Family History: Per hx tab HTN and CVA in multiple family members  Important Outpatient Medications: Losartan  Metoprolol  Hydrochlorothiazide   Lasix  - holds for PT  ASA 81 mg daily  Objective: BP (!) 146/55   Pulse 88   Temp (!) 97.1 F (36.2 C) (Oral)   Resp (!) 28   SpO2 100%  Exam: General: Awake, alert, breathing quickly w/o distress. Communicates clearly. Cardio: Tachycardic, no murmur, rub, gallop appreciated. 2+ radial and dorsalis pedis pulses b/l w/ good capillary refill. No LE edema appreciated Resp: Tachypneic with good air flow, R basilar crackles.  Abdomen: soft, non-tender, non-distended.  Extremities: LLE with diffuse bruising and swelling surrounding and distal to the L knee. Expected tenderness to palpation. Difficult to appreciate erythema given extent of skin discoloration.  Neuro: AOx4. No focal deficits. Appropriately conversational. L sided facial droop stable with her baseline. Sensation intact in b/l UE and LE.  Psych: Appropriate mood and affect.    Labs:  CBC  BMET  Recent Labs  Lab 09/14/24 1025  WBC 23.5*  HGB 9.2*  HCT 29.3*  PLT 193   Recent Labs  Lab 09/14/24 1004  NA 132*  K 4.6  CL 96*  CO2 21*  BUN 53*  CREATININE 1.90*  GLUCOSE 143*  CALCIUM  8.6*     AST 24, ALT 10, alkaline phosphatase 85 Lactic 0.9, 1.9 Troponin 195, 198 proBNP 18,334 Flu, RSV, COVID negative UA with large leukocytes, 100 protein, few bacteria, squamous epithelial cells present Blood cultures pending Urine cultures pending  EKG: Sinus tachycardia, rhythm regular, ST depressions in II, III, aVF, V5, V6.   Imaging Studies Performed: IMPRESSION: Bilateral pneumonia. Follow-up to resolution recommended. My interpretation: Bilateral basilar consolidations with R greater than L, concerning for PNA.    Manon Jester, DO 09/14/2024, 1:28 PM PGY-1, [facility]  FPTS Intern pager: [phone], text pages welcome Secure chat group [facility] Teaching Service     "

## 2024-09-14 NOTE — Assessment & Plan Note (Signed)
 HTN: Continue home metoprolol , hold losartan 

## 2024-09-14 NOTE — Consult Note (Signed)
 "  Cardiology Consultation   Patient ID: Alyssa Hudson MRN: [id number]; DOB: [birth year]  Admit date: 09/14/2024 Date of Consult: 09/14/2024  PCP:  [name], MD   [facility] Providers Cardiologist:  Dub Huntsman, DO       Patient Profile: Alyssa Hudson is a 83 y.o. female with a hx of hypertension, history of pericarditis, palpitations/SVT, carpal tunnel, diverticulosis who is being seen 09/14/2024 for the evaluation of elevated troponin at the request of Dr. Orie.  History of Present Illness: Alyssa Hudson is a 83 year old female with above medical history.  Previously had pericarditis in 06/2020.  Echo at that time showed EF 60-65%, no wall motion abnormalities, grade 2 DD, normal RV systolic function, small pericardial effusion, mild-moderate aortic valve sclerosis without stenosis.  Cardiac monitor in 07/2020 showed predominantly normal sinus rhythm with average heart rate 79 bpm, 6 episodes of SVT, no atrial fibrillation.  More recent monitor in 08/2024 showed predominantly normal sinus rhythm with an average heart rate of 67 bpm, episodes of Mobitz 2 and Mobitz 1.  Because of this, her beta-blocker was decreased  Patient presented to the ED on 12/20 with shortness of breath.  Patient reported had shortness of breath for about 2 months but her breathing has been worsening for about a week.  Also reported having decreased appetite.  She had not been taking her Lasix  because she was going to see physical therapy and did not want frequent urination.  In the ED, patient was hypertensive with BP as high as 148/60.  Tachypneic with respirations up to 38 breaths/min and she required a nonrebreather mask.  Creatinine elevated to 1.9, WBC elevated to 23.5.  Hemoglobin 9.2.  High-sensitivity troponin 195> 198.  proBNP elevated to 18,000.  TSH within normal limits, COVID/flu/RSV negative.  Chest x-ray with bilateral pneumonia.    On interview, patient tells me that she has  been short of breath for a while.  Describes it as getting winded.  She usually walks with a rollator, often has to stop to catch her breath.  She denies having any chest pain.  About a week ago, she had a mechanical fall and developed a hematoma on her leg.  She has been seeing orthopedics for this.  She tells them that for the past week, her breathing has been worse.  She has also had decreased appetite, decreased urine output.  She has not been taking her Lasix .  She denies cough.  Denies fever, chills, body aches.  Denies orthopnea, lower extremity swelling.  Denies weight gain and in fact has been losing weight.  Throughout all of this, she denies any chest pain.  Denies dizziness, syncope or near syncope.  Currently, she is resting comfortably in the bed and denies any complaints.  Wearing nonrebreather   Past Medical History:  Diagnosis Date   Acute diastolic heart failure (HCC) 06/30/2020   Acute pericarditis 07/24/2020   Aftercare following surgery 06/09/2016   Aortic valve sclerosis 2021   Moderate per echo in 2021   Atrial fibrillation (HCC) 07/24/2020   BMI 33.0-33.9,adult    Carpal tunnel syndrome, right 08/13/2020   Cervical radiculopathy 08/13/2020   Cervical radiculopathy due to osteoarthritis of spine    Diverticulosis    Encephalopathy 06/19/2020   Facial droop    Generalized osteoarthritis of multiple sites    Hypertension    Hyponatremia 06/20/2020   Primary hypertension 07/24/2020   Tricompartment osteoarthritis of right knee    Ulnar neuropathy at elbow, right 08/13/2020  Viral pericarditis with pericardial effusion 06/30/2020    Past Surgical History:  Procedure Laterality Date   CARPAL TUNNEL RELEASE Right 10/26/2020   CATARACT EXTRACTION  2003   insert prosthetic lens   CHOLECYSTECTOMY  01/1997   IR FLUORO GUIDED NEEDLE PLC ASPIRATION/INJECTION LOC  06/23/2020   RADIOLOGY WITH ANESTHESIA N/A 06/23/2020   Procedure: IR WITH ANESTHESIA;  Surgeon: Radiologist,  Medication, MD;  Location: [facility] OR;  Service: Radiology;  Laterality: N/A;   TOTAL HYSTERECTOMY  02/1997     Scheduled Meds:  metoprolol  succinate  50 mg Oral Daily   Continuous Infusions:  [START ON 09/15/2024] azithromycin      [START ON 09/15/2024] cefTRIAXone  (ROCEPHIN )  IV     heparin  850 Units/hr (09/14/24 1408)   PRN Meds: perflutren  lipid microspheres (DEFINITY ) IV suspension  Allergies:   Allergies[1]  Social History:   Social History   Socioeconomic History   Marital status: Married    Spouse name: Not on file   Number of children: 2   Years of education: Not on file   Highest education level: High school graduate  Occupational History   Not on file  Tobacco Use   Smoking status: Never   Smokeless tobacco: Never  Substance and Sexual Activity   Alcohol use: Never   Drug use: Never   Sexual activity: Not on file  Other Topics Concern   Not on file  Social History Narrative   Lives at home with husband   Caffeine: none    Social Drivers of Health   Tobacco Use: Low Risk (07/31/2024)   Patient History    Smoking Tobacco Use: Never    Smokeless Tobacco Use: Never    Passive Exposure: Not on file  Financial Resource Strain: Not on file  Food Insecurity: Not on file  Transportation Needs: Not on file  Physical Activity: Not on file  Stress: Not on file  Social Connections: Not on file  Intimate Partner Violence: Not on file  Depression (EYV7-0): Not on file  Alcohol Screen: Not on file  Housing: Not on file  Utilities: Not on file  Health Literacy: Not on file    Family History:    Family History  Problem Relation Age of Onset   Hypertension Mother    CVA Mother    Prostate cancer Father 21   Hypertension Father    Alzheimer's disease Sister 74   Muscular dystrophy Brother    Alcohol abuse Brother    Stroke Brother      ROS:  Please see the history of present illness.  All other ROS reviewed and negative.     Physical Exam/Data: Vitals:    09/14/24 1425 09/14/24 1430 09/14/24 1445 09/14/24 1454  BP:  (!) 132/46 (!) 128/45   Pulse: 91  86   Resp:  (!) 36 (!) 29   Temp:    98.3 F (36.8 C)  TempSrc:    Axillary  SpO2:  100% 100%   Weight:      Height:       No intake or output data in the 24 hours ending 09/14/24 1501    09/14/2024    1:37 PM 07/31/2024    3:04 PM 07/28/2023    3:47 PM  Last 3 Weights  Weight (lbs) 158 lb 1.1 oz 160 lb 12.8 oz 163 lb 6.4 oz  Weight (kg) 71.7 kg 72.938 kg 74.118 kg     Body mass index is 25.51 kg/m.  General:  Elderly female. Sitting upright  in the bed. Uncomfortable but in no acute distress  HEENT: normal Neck: no JVD Vascular: Radial pulses 2+ bilaterally Cardiac:  normal S1, S2; RRR; no murmur   Lungs:  Crackles in lung bases. On Rebreather  Abd: soft, nontender  Ext: no edema in BLE. Large hematoma  on left calf/shin  Musculoskeletal:  No deformities  Skin: warm and dry  Neuro:  CNs 2-12 intact, no focal abnormalities noted Psych:  Normal affect   EKG:  The EKG was personally reviewed and demonstrates:  Initial EKG showed Sinus rhythm with heart rate 93 bpm, ST depression in leads II, 3, aVF, V5, V6. Repeat EKG showed normal sinus rhythm with heart rate 75 bpm.  ST depression improved in leads II, III, aVF, V5 and V6 Telemetry:  Telemetry was personally reviewed and demonstrates:  Sinus bradycardia   Relevant CV Studies: Cardiac Studies & Procedures   ______________________________________________________________________________________________     ECHOCARDIOGRAM  ECHOCARDIOGRAM COMPLETE 09/14/2024  Narrative ECHOCARDIOGRAM REPORT    Patient Name:   SAUMYA HUKILL Date of Exam: 09/14/2024 Medical Rec #:  [id number]       Height:       66.0 in Accession #:    [id number]      Weight:       158.1 lb Date of Birth:  [birth year]       BSA:          1.810 m Patient Age:    83 years        BP:           145/55 mmHg Patient Gender: F               HR:           56  bpm. Exam Location:  Inpatient  Procedure: 2D Echo, Color Doppler, Cardiac Doppler and Intracardiac Opacification Agent (Both Spectral and Color Flow Doppler were utilized during procedure).  Indications:    R94.31 Abnormal EKG  History:        Patient has prior history of Echocardiogram examinations, most recent 06/29/2020. Pulmonary HTN, Arrythmias:Atrial Fibrillation; Risk Factors:Hypertension.  Sonographer:    Damien Senior RDCS Referring Phys: [id number] JOSHUA G LONG   Sonographer Comments: Scanned upright on bipap IMPRESSIONS   1. Left ventricular ejection fraction, by estimation, is 55 to 60%. Left ventricular ejection fraction by 2D MOD biplane is 57.2 %. The left ventricle has normal function. The left ventricle has no regional wall motion abnormalities. Left ventricular diastolic parameters are consistent with Grade III diastolic dysfunction (restrictive). Elevated left atrial pressure. 2. Right ventricular systolic function is normal. The right ventricular size is mildly enlarged. There is moderately elevated pulmonary artery systolic pressure. The estimated right ventricular systolic pressure is 50.0 mmHg. 3. Left atrial size was moderately dilated. 4. The mitral valve is degenerative. Mild to moderate mitral valve regurgitation. 5. Tricuspid valve regurgitation is moderate. 6. The aortic valve is calcified. Aortic valve regurgitation is not visualized. Aortic valve sclerosis/calcification is present, without any evidence of aortic stenosis. 7. The inferior vena cava is dilated in size with <50% respiratory variability, suggesting right atrial pressure of 15 mmHg.  Comparison(s): Changes from prior study are noted. EF is similar and TR is now moderate.  FINDINGS Left Ventricle: Left ventricular ejection fraction, by estimation, is 55 to 60%. Left ventricular ejection fraction by 2D MOD biplane is 57.2 %. The left ventricle has normal function. The left ventricle has no  regional wall motion abnormalities. Definity  contrast agent was given  IV to delineate the left ventricular endocardial borders. The left ventricular internal cavity size was normal in size. There is no left ventricular hypertrophy. Left ventricular diastolic function could not be evaluated due to mitral annular calcification (moderate or greater). Left ventricular diastolic parameters are consistent with Grade III diastolic dysfunction (restrictive). Elevated left atrial pressure.  Right Ventricle: The right ventricular size is mildly enlarged. No increase in right ventricular wall thickness. Right ventricular systolic function is normal. There is moderately elevated pulmonary artery systolic pressure. The tricuspid regurgitant velocity is 3.24 m/s, and with an assumed right atrial pressure of 8 mmHg, the estimated right ventricular systolic pressure is 50.0 mmHg.  Left Atrium: Left atrial size was moderately dilated.  Right Atrium: Right atrial size was normal in size.  Pericardium: There is no evidence of pericardial effusion.  Mitral Valve: The mitral valve is degenerative in appearance. Mild to moderate mitral annular calcification. Mild to moderate mitral valve regurgitation. MV peak gradient, 15.8 mmHg. The mean mitral valve gradient is 3.0 mmHg.  Tricuspid Valve: The tricuspid valve is normal in structure. Tricuspid valve regurgitation is moderate . No evidence of tricuspid stenosis.  Aortic Valve: The aortic valve is calcified. Aortic valve regurgitation is not visualized. Aortic valve sclerosis/calcification is present, without any evidence of aortic stenosis.  Pulmonic Valve: The pulmonic valve was not well visualized. Pulmonic valve regurgitation is not visualized.  Aorta: The aortic root and ascending aorta are structurally normal, with no evidence of dilitation.  Venous: The inferior vena cava is dilated in size with less than 50% respiratory variability, suggesting right atrial  pressure of 15 mmHg.  IAS/Shunts: No atrial level shunt detected by color flow Doppler.   LEFT VENTRICLE PLAX 2D                        Biplane EF (MOD) LVIDd:         5.20 cm         LV Biplane EF:   Left LVIDs:         3.40 cm                          ventricular LV PW:         0.80 cm                          ejection LV IVS:        0.90 cm                          fraction by LVOT diam:     1.90 cm                          2D MOD LV SV:         65                               biplane is LV SV Index:   36                               57.2 %. LVOT Area:     2.84 cm Diastology LV e' medial:    12.40 cm/s LV Volumes (MOD)  LV E/e' medial:  14.0 LV vol d, MOD    107.0 ml      LV e' lateral:   13.16 cm/s A2C:                           LV E/e' lateral: 13.2 LV vol d, MOD    108.4 ml A4C: LV vol s, MOD    41.8 ml A2C: LV vol s, MOD    50.2 ml A4C: LV SV MOD A2C:   65.2 ml LV SV MOD A4C:   108.4 ml LV SV MOD BP:    63.4 ml  RIGHT VENTRICLE RV S prime:     19.10 cm/s TAPSE (M-mode): 3.0 cm  LEFT ATRIUM             Index        RIGHT ATRIUM           Index LA diam:        4.10 cm 2.27 cm/m   RA Area:     17.00 cm LA Vol (A2C):   85.7 ml 47.36 ml/m  RA Volume:   50.20 ml  27.74 ml/m LA Vol (A4C):   72.5 ml 40.07 ml/m LA Biplane Vol: 82.3 ml 45.48 ml/m AORTIC VALVE LVOT Vmax:   88.30 cm/s LVOT Vmean:  61.600 cm/s LVOT VTI:    0.229 m  AORTA Ao Root diam: 3.40 cm Ao Asc diam:  3.20 cm  MITRAL VALVE                TRICUSPID VALVE MV Area VTI:  1.12 cm      TR Peak grad:   42.0 mmHg MV Peak grad: 15.8 mmHg     TR Vmax:        324.00 cm/s MV Mean grad: 3.0 mmHg MV Vmax:      1.99 m/s      SHUNTS MV Vmean:     66.3 cm/s     Systemic VTI:  0.23 m MV E velocity: 173.86 cm/s  Systemic Diam: 1.90 cm MV A velocity: 70.27 cm/s MV E/A ratio:  2.47  Franck Azobou Tonleu Electronically signed by Joelle Cedars Tonleu Signature Date/Time: 09/14/2024/3:13:50  PM    Final    MONITORS  LONG TERM MONITOR (3-14 DAYS) 08/28/2024  Narrative Patch Wear Time:  13 days and 21 hours (2025-11-05T15:35:42-0500 to 2025-11-19T12:38:32-0500)  Patient had a min HR of 33 bpm, max HR of 88 bpm, and avg HR of 67 bpm.  Predominant underlying rhythm was Sinus Rhythm.  First Degree AV Block was present. 116 episode(s) of AV Block (2nd Mobitz II) occurred, lasting a total of 27 mins 21 secs. Second Degree AV Block-Mobitz I (Wenckebach) was present. AV Block (2nd Mobitz II) and Wenckebach were detected within +/- 45 seconds of symptomatic patient event(s). Isolated SVEs were rare (<1.0%), and no SVE Couplets or SVE Triplets were present.  Isolated VEs were rare (<1.0%, 446), VE Couplets were rare (<1.0%, 2), and VE Triplets were rare (<1.0%, 1). Ventricular Bigeminy was present. Difficulty discerning atrial activity making definitive diagnosis difficult to ascertain. MD notification criteria for Second Degree AV Block, Mobitz II met - report posted prior to notification (AP).       ______________________________________________________________________________________________       Laboratory Data: High Sensitivity Troponin:  No results for input(s): TROPONINIHS in the last 720 hours.  Recent Labs  Lab 09/14/24 1004 09/14/24 1141  TRNPT 195* 198*  Chemistry Recent Labs  Lab 09/14/24 1004  NA 132*  K 4.6  CL 96*  CO2 21*  GLUCOSE 143*  BUN 53*  CREATININE 1.90*  CALCIUM  8.6*  GFRNONAA 25*  ANIONGAP 15    Recent Labs  Lab 09/14/24 1004  PROT 6.6  ALBUMIN 3.8  AST 24  ALT 10  ALKPHOS 85  BILITOT 0.8   Lipids No results for input(s): CHOL, TRIG, HDL, LABVLDL, LDLCALC, CHOLHDL in the last 168 hours.  Hematology Recent Labs  Lab 09/14/24 1025  WBC 23.5*  RBC 3.23*  HGB 9.2*  HCT 29.3*  MCV 90.7  MCH 28.5  MCHC 31.4  RDW 15.0  PLT 193   Thyroid  No results for input(s): TSH, FREET4 in the last 168 hours.   BNP Recent Labs  Lab 09/14/24 1004  PROBNP 18,334.0*    DDimer No results for input(s): DDIMER in the last 168 hours.  Radiology/Studies:  DG Chest Portable 1 View Result Date: 09/14/2024 CLINICAL DATA:  Shortness of breath. EXAM: PORTABLE CHEST 1 VIEW COMPARISON:  Chest radiograph dated 06/29/2020. FINDINGS: Bilateral patchy and confluent airspace opacities, predominantly involving the mid to lower lung field, and right greater than left consistent with pneumonia. Small bilateral pleural effusions. No pneumothorax. The cardiac silhouette is within normal limits. Osteopenia with degenerative changes of the spine. Chronic appearing fracture of the right humeral neck. No acute osseous pathology. IMPRESSION: Bilateral pneumonia. Follow-up to resolution recommended. Electronically Signed   By: Vanetta Chou M.D.   On: 09/14/2024 10:38   CT KNEE LEFT WO CONTRAST Result Date: 09/12/2024 CLINICAL DATA:  Clemens and injured left knee. Persistent pain and swelling. EXAM: CT OF THE LEFT KNEE WITHOUT CONTRAST TECHNIQUE: Multidetector CT imaging of the left knee was performed according to the standard protocol. Multiplanar CT image reconstructions were also generated. RADIATION DOSE REDUCTION: This exam was performed according to the departmental dose-optimization program which includes automated exposure control, adjustment of the mA and/or kV according to patient size and/or use of iterative reconstruction technique. COMPARISON:  None Available. FINDINGS: There is a very large hematoma involving the anterior aspect of the knee wrapping around the patella and patellar tendon. This is likely traumatic hemorrhagic prepatellar bursitis given its location and appearance. It is all superficial to the patellar tendon and medial retinacular structures. No involvement of the lower quadriceps muscles. The quadriceps and patellar tendons are intact. No acute fracture is identified. Specifically, the patella is intact.  Age related tricompartmental degenerative changes most significant in the medial compartment. Grossly by CT the cruciate and collateral ligaments are intact. Age related vascular calcifications.  No joint effusion. IMPRESSION: 1. Very large hematoma involving the anterior aspect of the knee wrapping around the patella and patellar tendon. This is likely traumatic hemorrhagic prepatellar bursitis given its location and appearance. 2. No acute fracture is identified. Specifically, the patella is intact. 3. Age related tricompartmental degenerative changes most significant in the medial compartment. Electronically Signed   By: MYRTIS Stammer M.D.   On: 09/12/2024 09:49     Assessment and Plan:  Elevated Troponin  Abnormal EKG -High-sensitivity troponin 195, 198.  Initial EKG showed Sinus rhythm with heart rate 93 bpm, ST depression in leads II, 3, aVF, V5, V6. Repeat EKG showed normal sinus rhythm with heart rate 75 bpm.  ST depression improved in leads II, III, aVF, V5 and V6 - Patient denies chest pain.  Echocardiogram this admission with EF 55-60% no wall motion abnormalities - Presentation not consistent with ACS.  Suspect that EKG changes due to hypoxia.  ST depression improved once patient was on nonrebreather. -Reasonable to continue IV heparin  until PE ruled out.  Pending lower extremity Dopplers.  Could also consider VQ scan - As patient has never had an ischemic evaluation, would be reasonable to pursue stress test as an outpatient.  Cannot do coronary CTA as patient has contrast allergy  Chronic Diastolic Heart Failure  Moderate TR Mild-moderate MR - Echocardiogram this admission showed EF 55-60%, no wall motion abnormalities, grade 3 diastolic dysfunction, normal RV systolic function, moderately elevated PA systolic pressure, mild-moderate MR, moderate TR - Patient presented with worsening dyspnea.  proBNP elevated to 18,334.  Patient does have shortness of breath but denies lower extremity  swelling, orthopnea.  Of note, her weight has gone down compared to recent visits.  Was previously 163 pounds on 11/1, 160 pounds 11/5, 158 pounds today - Chest x-ray with bilateral pneumonia, small bilateral pleural effusions - Creatinine elevated to 1.90.  Was previously 1.40 in 06/2023 - Patient was given a 40 mg dose of Lasix .  Follow response to this and recheck BMP in the morning.  Admits that she has not been eating or drinking very much for the past few days.  She is euvolemic on exam.  I am not convinced she will need more diuresis   History of pSVT  - Currently in sinus bradycardia - Of note, patient possibly had atrial fibrillation in the setting of pericarditis in 2021.  Monitor after that admission showed normal sinus rhythm and patient was not started on anticoagulation.  Continue to monitor on telemetry  Otherwise per primary  - Sepsis, pneumonia - AKI   Risk Assessment/Risk Scores:  [place]  Heart Association (NYHA) Functional Class NYHA Class II    For questions or updates, please contact [facility] Please consult [email or web address] for contact info under    Signed, Rollo FABIENE Louder, PA-C  09/14/2024 3:01 PM     [1]  Allergies Allergen Reactions   Codeine Nausea Only and Nausea And Vomiting    Insomnia   Contrast Media [Iodinated Contrast Media] Other (See Comments)    Turned red all over   Iodine-131 Hives and Swelling   Sulfa Antibiotics Nausea Only   "

## 2024-09-15 ENCOUNTER — Encounter (HOSPITAL_COMMUNITY): Payer: Self-pay | Admitting: Cardiovascular Disease

## 2024-09-15 ENCOUNTER — Inpatient Hospital Stay (HOSPITAL_COMMUNITY)

## 2024-09-15 ENCOUNTER — Inpatient Hospital Stay (HOSPITAL_COMMUNITY)
Admission: EM | Disposition: A | Discharge status: Skilled Nursing Facility | Payer: Self-pay | Source: Home / Self Care | Attending: Internal Medicine

## 2024-09-15 DIAGNOSIS — R0603 Acute respiratory distress: Secondary | ICD-10-CM

## 2024-09-15 DIAGNOSIS — N189 Chronic kidney disease, unspecified: Secondary | ICD-10-CM | POA: Diagnosis not present

## 2024-09-15 DIAGNOSIS — R000 Tachycardia, unspecified: Secondary | ICD-10-CM

## 2024-09-15 DIAGNOSIS — I214 Non-ST elevation (NSTEMI) myocardial infarction: Secondary | ICD-10-CM

## 2024-09-15 DIAGNOSIS — I509 Heart failure, unspecified: Secondary | ICD-10-CM

## 2024-09-15 DIAGNOSIS — R579 Shock, unspecified: Secondary | ICD-10-CM | POA: Diagnosis not present

## 2024-09-15 DIAGNOSIS — I249 Acute ischemic heart disease, unspecified: Secondary | ICD-10-CM

## 2024-09-15 DIAGNOSIS — I5030 Unspecified diastolic (congestive) heart failure: Secondary | ICD-10-CM

## 2024-09-15 DIAGNOSIS — J9601 Acute respiratory failure with hypoxia: Secondary | ICD-10-CM | POA: Diagnosis not present

## 2024-09-15 DIAGNOSIS — I2510 Atherosclerotic heart disease of native coronary artery without angina pectoris: Secondary | ICD-10-CM | POA: Diagnosis not present

## 2024-09-15 DIAGNOSIS — I4891 Unspecified atrial fibrillation: Secondary | ICD-10-CM | POA: Diagnosis not present

## 2024-09-15 DIAGNOSIS — R0600 Dyspnea, unspecified: Secondary | ICD-10-CM

## 2024-09-15 HISTORY — DX: Heart failure, unspecified: I50.9

## 2024-09-15 HISTORY — PX: CORONARY STENT INTERVENTION: CATH118234

## 2024-09-15 HISTORY — PX: LEFT HEART CATH AND CORONARY ANGIOGRAPHY: CATH118249

## 2024-09-15 LAB — POCT ACTIVATED CLOTTING TIME
Activated Clotting Time: 235 s
Activated Clotting Time: 271 s
Activated Clotting Time: 214 s

## 2024-09-15 LAB — POCT I-STAT 7, (LYTES, BLD GAS, ICA,H+H)
Acid-base deficit: 6 mmol/L — ABNORMAL HIGH (ref 0.0–2.0)
Bicarbonate: 19.7 mmol/L — ABNORMAL LOW (ref 20.0–28.0)
Calcium, Ion: 1.1 mmol/L — ABNORMAL LOW (ref 1.15–1.40)
HCT: 24 % — ABNORMAL LOW (ref 36.0–46.0)
Hemoglobin: 8.2 g/dL — ABNORMAL LOW (ref 12.0–15.0)
O2 Saturation: 100 %
Patient temperature: 98
Potassium: 4 mmol/L (ref 3.5–5.1)
Sodium: 133 mmol/L — ABNORMAL LOW (ref 135–145)
TCO2: 21 mmol/L — ABNORMAL LOW (ref 22–32)
pCO2 arterial: 35.9 mmHg (ref 32–48)
pH, Arterial: 7.346 — ABNORMAL LOW (ref 7.35–7.45)
pO2, Arterial: 322 mmHg — ABNORMAL HIGH (ref 83–108)

## 2024-09-15 LAB — CBC
HCT: 26.4 % — ABNORMAL LOW (ref 36.0–46.0)
HCT: 26.7 % — ABNORMAL LOW (ref 36.0–46.0)
Hemoglobin: 8.4 g/dL — ABNORMAL LOW (ref 12.0–15.0)
Hemoglobin: 8.5 g/dL — ABNORMAL LOW (ref 12.0–15.0)
MCH: 28.6 pg (ref 26.0–34.0)
MCH: 28.4 pg (ref 26.0–34.0)
MCHC: 31.8 g/dL (ref 30.0–36.0)
MCHC: 31.8 g/dL (ref 30.0–36.0)
MCV: 89.8 fL (ref 80.0–100.0)
MCV: 89.3 fL (ref 80.0–100.0)
Platelets: 181 K/uL (ref 150–400)
Platelets: 237 K/uL (ref 150–400)
RBC: 2.94 MIL/uL — ABNORMAL LOW (ref 3.87–5.11)
RBC: 2.99 MIL/uL — ABNORMAL LOW (ref 3.87–5.11)
RDW: 15.2 % (ref 11.5–15.5)
RDW: 15.2 % (ref 11.5–15.5)
WBC: 21 K/uL — ABNORMAL HIGH (ref 4.0–10.5)
WBC: 17 K/uL — ABNORMAL HIGH (ref 4.0–10.5)
nRBC: 0 % (ref 0.0–0.2)
nRBC: 0 % (ref 0.0–0.2)

## 2024-09-15 LAB — BASIC METABOLIC PANEL WITH GFR
Anion gap: 18 — ABNORMAL HIGH (ref 5–15)
BUN: 61 mg/dL — ABNORMAL HIGH (ref 8–23)
CO2: 19 mmol/L — ABNORMAL LOW (ref 22–32)
Calcium: 8.7 mg/dL — ABNORMAL LOW (ref 8.9–10.3)
Chloride: 98 mmol/L (ref 98–111)
Creatinine, Ser: 1.89 mg/dL — ABNORMAL HIGH (ref 0.44–1.00)
GFR, Estimated: 26 mL/min — ABNORMAL LOW
Glucose, Bld: 114 mg/dL — ABNORMAL HIGH (ref 70–99)
Potassium: 4.4 mmol/L (ref 3.5–5.1)
Sodium: 136 mmol/L (ref 135–145)

## 2024-09-15 LAB — TROPONIN T, HIGH SENSITIVITY
Troponin T High Sensitivity: 331 ng/L (ref 0–19)
Troponin T High Sensitivity: 742 ng/L (ref 0–19)

## 2024-09-15 LAB — PROCALCITONIN: Procalcitonin: 1.4 ng/mL

## 2024-09-15 LAB — BLOOD GAS, VENOUS
Acid-base deficit: 3.8 mmol/L — ABNORMAL HIGH (ref 0.0–2.0)
Bicarbonate: 20.7 mmol/L (ref 20.0–28.0)
O2 Saturation: 89.4 %
Patient temperature: 37
pCO2, Ven: 35 mmHg — ABNORMAL LOW (ref 44–60)
pH, Ven: 7.38 (ref 7.25–7.43)
pO2, Ven: 59 mmHg — ABNORMAL HIGH (ref 32–45)

## 2024-09-15 LAB — LACTIC ACID, PLASMA
Lactic Acid, Venous: 3 mmol/L (ref 0.5–1.9)
Lactic Acid, Venous: 2.9 mmol/L (ref 0.5–1.9)

## 2024-09-15 LAB — COOXEMETRY PANEL
Carboxyhemoglobin: 1.1 % (ref 0.5–1.5)
Methemoglobin: <0.7 % (ref 0.0–1.5)
O2 Saturation: 63.6 %
Total hemoglobin: 7.4 g/dL — ABNORMAL LOW (ref 12.0–16.0)

## 2024-09-15 LAB — MAGNESIUM: Magnesium: 2.6 mg/dL — ABNORMAL HIGH (ref 1.7–2.4)

## 2024-09-15 LAB — HEPARIN LEVEL (UNFRACTIONATED)
Heparin Unfractionated: 0.16 [IU]/mL — ABNORMAL LOW (ref 0.30–0.70)
Heparin Unfractionated: 0.12 [IU]/mL — ABNORMAL LOW (ref 0.30–0.70)

## 2024-09-15 LAB — CREATININE, SERUM
Creatinine, Ser: 1.95 mg/dL — ABNORMAL HIGH (ref 0.44–1.00)
GFR, Estimated: 25 mL/min — ABNORMAL LOW

## 2024-09-15 MED ORDER — ROCURONIUM BROMIDE 10 MG/ML (PF) SYRINGE
PREFILLED_SYRINGE | INTRAVENOUS | Status: AC
  Administered 2024-09-15: 50 mg
  Filled 2024-09-15: qty 10

## 2024-09-15 MED ORDER — SODIUM CHLORIDE 0.9% FLUSH
3.0000 mL | Freq: Two times a day (BID) | INTRAVENOUS | Status: DC
  Administered 2024-09-15 – 2024-09-30 (×26): 3 mL via INTRAVENOUS

## 2024-09-15 MED ORDER — SODIUM CHLORIDE 0.9% FLUSH: 3.0000 mL | INTRAVENOUS | Status: DC | PRN

## 2024-09-15 MED ORDER — ETOMIDATE 2 MG/ML IV SOLN
INTRAVENOUS | Status: AC
  Filled 2024-09-15: qty 10

## 2024-09-15 MED ORDER — CANGRELOR BOLUS VIA INFUSION
INTRAVENOUS | Status: DC | PRN
  Administered 2024-09-15: 2118 ug via INTRAVENOUS

## 2024-09-15 MED ORDER — SODIUM CHLORIDE 0.9 % IV SOLN
0.7500 ug/kg/min | INTRAVENOUS | Status: DC
  Filled 2024-09-15 (×2): qty 50

## 2024-09-15 MED ORDER — PIPERACILLIN-TAZOBACTAM IN DEX 2-0.25 GM/50ML IV SOLN
2.2500 g | Freq: Three times a day (TID) | INTRAVENOUS | Status: DC
  Administered 2024-09-15 – 2024-09-18 (×10): 2.25 g via INTRAVENOUS
  Filled 2024-09-15 (×14): qty 50

## 2024-09-15 MED ORDER — FENTANYL 2500MCG IN NS 250ML (10MCG/ML) PREMIX INFUSION
0.0000 ug/h | INTRAVENOUS | Status: DC
  Administered 2024-09-15: 25 ug/h via INTRAVENOUS
  Filled 2024-09-15: qty 250

## 2024-09-15 MED ORDER — ACETAMINOPHEN 325 MG PO TABS
650.0000 mg | ORAL_TABLET | Freq: Four times a day (QID) | ORAL | Status: DC | PRN
  Administered 2024-09-22: 650 mg via ORAL
  Filled 2024-09-15 (×2): qty 2

## 2024-09-15 MED ORDER — SUCCINYLCHOLINE CHLORIDE 200 MG/10ML IV SOSY
PREFILLED_SYRINGE | INTRAVENOUS | Status: AC
  Filled 2024-09-15: qty 10

## 2024-09-15 MED ORDER — FENTANYL CITRATE (PF) 50 MCG/ML IJ SOSY
25.0000 ug | PREFILLED_SYRINGE | Freq: Once | INTRAMUSCULAR | Status: AC
  Administered 2024-09-15: 50 ug via INTRAVENOUS
  Filled 2024-09-15: qty 1

## 2024-09-15 MED ORDER — POLYETHYLENE GLYCOL 3350 17 G PO PACK
17.0000 g | PACK | Freq: Every day | ORAL | Status: DC
  Administered 2024-09-16: 17 g
  Filled 2024-09-15 (×2): qty 1

## 2024-09-15 MED ORDER — CHLORHEXIDINE GLUCONATE CLOTH 2 % EX PADS
6.0000 | MEDICATED_PAD | Freq: Every day | CUTANEOUS | Status: DC
  Administered 2024-09-15 – 2024-09-20 (×6): 6 via TOPICAL

## 2024-09-15 MED ORDER — ORAL CARE MOUTH RINSE: 15.0000 mL | OROMUCOSAL | Status: DC | PRN

## 2024-09-15 MED ORDER — NOREPINEPHRINE 4 MG/250ML-% IV SOLN
0.0000 ug/min | INTRAVENOUS | Status: DC
  Administered 2024-09-16: 2 ug/min via INTRAVENOUS

## 2024-09-15 MED ORDER — ASPIRIN 81 MG PO TBEC: 81.0000 mg | DELAYED_RELEASE_TABLET | Freq: Every day | ORAL | Status: DC

## 2024-09-15 MED ORDER — NITROGLYCERIN 1 MG/10 ML FOR IR/CATH LAB
INTRA_ARTERIAL | Status: DC | PRN
  Administered 2024-09-15 (×2): 150 ug via INTRACORONARY

## 2024-09-15 MED ORDER — FENTANYL CITRATE (PF) 100 MCG/2ML IJ SOLN
INTRAMUSCULAR | Status: AC
  Filled 2024-09-15: qty 2

## 2024-09-15 MED ORDER — HEPARIN SODIUM (PORCINE) 5000 UNIT/ML IJ SOLN
5000.0000 [IU] | Freq: Three times a day (TID) | INTRAMUSCULAR | Status: DC
  Administered 2024-09-16 – 2024-09-17 (×5): 5000 [IU] via SUBCUTANEOUS
  Filled 2024-09-15 (×5): qty 1

## 2024-09-15 MED ORDER — HEPARIN (PORCINE) IN NACL 1000-0.9 UT/500ML-% IV SOLN
INTRAVENOUS | Status: DC | PRN
  Administered 2024-09-15 (×2): 500 mL

## 2024-09-15 MED ORDER — MIDAZOLAM HCL 2 MG/2ML IJ SOLN
INTRAMUSCULAR | Status: AC
  Administered 2024-09-15: 2 mg
  Filled 2024-09-15: qty 2

## 2024-09-15 MED ORDER — VERAPAMIL HCL 2.5 MG/ML IV SOLN
INTRAVENOUS | Status: AC
  Filled 2024-09-15: qty 2

## 2024-09-15 MED ORDER — CLOPIDOGREL BISULFATE 300 MG PO TABS
600.0000 mg | ORAL_TABLET | Freq: Once | ORAL | Status: AC
  Administered 2024-09-15: 600 mg
  Filled 2024-09-15: qty 2

## 2024-09-15 MED ORDER — FENTANYL BOLUS VIA INFUSION
25.0000 ug | INTRAVENOUS | Status: DC | PRN
  Administered 2024-09-15 (×2): 25 ug via INTRAVENOUS
  Administered 2024-09-15 (×3): 50 ug via INTRAVENOUS
  Administered 2024-09-16: 75 ug via INTRAVENOUS
  Administered 2024-09-16: 25 ug via INTRAVENOUS

## 2024-09-15 MED ORDER — ASPIRIN 81 MG PO CHEW
81.0000 mg | CHEWABLE_TABLET | Freq: Every day | ORAL | Status: DC
  Administered 2024-09-15 – 2024-09-16 (×2): 81 mg
  Filled 2024-09-15 (×2): qty 1

## 2024-09-15 MED ORDER — CLOPIDOGREL BISULFATE 300 MG PO TABS: 600.0000 mg | ORAL_TABLET | Freq: Once | ORAL | Status: DC

## 2024-09-15 MED ORDER — STERILE WATER FOR INJECTION IJ SOLN
INTRAMUSCULAR | Status: AC
  Filled 2024-09-15: qty 10

## 2024-09-15 MED ORDER — CANGRELOR TETRASODIUM 50 MG IV SOLR
INTRAVENOUS | Status: AC
  Filled 2024-09-15: qty 50

## 2024-09-15 MED ORDER — ORAL CARE MOUTH RINSE
15.0000 mL | OROMUCOSAL | Status: DC
  Administered 2024-09-15 – 2024-09-17 (×21): 15 mL via OROMUCOSAL

## 2024-09-15 MED ORDER — HEPARIN SODIUM (PORCINE) 1000 UNIT/ML IJ SOLN
INTRAMUSCULAR | Status: AC
  Filled 2024-09-15: qty 10

## 2024-09-15 MED ORDER — FUROSEMIDE 10 MG/ML IJ SOLN
40.0000 mg | Freq: Once | INTRAMUSCULAR | Status: AC
  Administered 2024-09-15: 40 mg via INTRAVENOUS
  Filled 2024-09-15: qty 4

## 2024-09-15 MED ORDER — FENTANYL CITRATE (PF) 100 MCG/2ML IJ SOLN
INTRAMUSCULAR | Status: DC | PRN
  Administered 2024-09-15: 25 ug via INTRAVENOUS

## 2024-09-15 MED ORDER — LIDOCAINE HCL (PF) 1 % IJ SOLN
INTRAMUSCULAR | Status: AC
  Filled 2024-09-15: qty 30

## 2024-09-15 MED ORDER — METHYLPREDNISOLONE SODIUM SUCC 125 MG IJ SOLR: 125.0000 mg | INTRAMUSCULAR | Status: DC

## 2024-09-15 MED ORDER — SODIUM CHLORIDE 0.9 % IV SOLN
INTRAVENOUS | Status: AC | PRN
  Administered 2024-09-15: 4 ug/kg/min via INTRAVENOUS

## 2024-09-15 MED ORDER — IOHEXOL 350 MG/ML SOLN
INTRAVENOUS | Status: DC | PRN
  Administered 2024-09-15: 110 mL

## 2024-09-15 MED ORDER — MIDAZOLAM HCL 2 MG/2ML IJ SOLN
INTRAMUSCULAR | Status: AC
  Administered 2024-09-15: 2 mg via INTRAVENOUS
  Filled 2024-09-15: qty 2

## 2024-09-15 MED ORDER — CLOPIDOGREL BISULFATE 75 MG PO TABS: 75.0000 mg | ORAL_TABLET | Freq: Every day | ORAL | Status: DC

## 2024-09-15 MED ORDER — HYDRALAZINE HCL 20 MG/ML IJ SOLN: 10.0000 mg | INTRAMUSCULAR | Status: AC | PRN

## 2024-09-15 MED ORDER — FREE WATER
250.0000 mL | Freq: Once | Status: AC
  Administered 2024-09-15: 250 mL via ORAL

## 2024-09-15 MED ORDER — MIDAZOLAM HCL (PF) 2 MG/2ML IJ SOLN: 1.0000 mg | INTRAMUSCULAR | Status: DC | PRN

## 2024-09-15 MED ORDER — NITROGLYCERIN 1 MG/10 ML FOR IR/CATH LAB
INTRA_ARTERIAL | Status: AC
  Filled 2024-09-15: qty 10

## 2024-09-15 MED ORDER — IPRATROPIUM-ALBUTEROL 0.5-2.5 (3) MG/3ML IN SOLN
3.0000 mL | RESPIRATORY_TRACT | Status: DC | PRN
  Administered 2024-09-20 – 2024-09-24 (×6): 3 mL via RESPIRATORY_TRACT
  Filled 2024-09-15 (×2): qty 3

## 2024-09-15 MED ORDER — HEPARIN SODIUM (PORCINE) 1000 UNIT/ML IJ SOLN
INTRAMUSCULAR | Status: DC | PRN
  Administered 2024-09-15: 3000 [IU] via INTRAVENOUS
  Administered 2024-09-15: 5000 [IU] via INTRAVENOUS

## 2024-09-15 MED ORDER — ALBUTEROL SULFATE (2.5 MG/3ML) 0.083% IN NEBU
INHALATION_SOLUTION | RESPIRATORY_TRACT | Status: AC
  Administered 2024-09-15: 2.5 mg
  Filled 2024-09-15: qty 3

## 2024-09-15 MED ORDER — FENTANYL CITRATE (PF) 50 MCG/ML IJ SOSY
50.0000 ug | PREFILLED_SYRINGE | Freq: Once | INTRAMUSCULAR | Status: AC
  Administered 2024-09-15: 50 ug via INTRAVENOUS

## 2024-09-15 MED ORDER — METHYLPREDNISOLONE SODIUM SUCC 125 MG IJ SOLR
125.0000 mg | INTRAMUSCULAR | Status: AC
  Administered 2024-09-15: 125 mg via INTRAVENOUS
  Filled 2024-09-15: qty 2

## 2024-09-15 MED ORDER — LIDOCAINE HCL (PF) 1 % IJ SOLN
INTRAMUSCULAR | Status: DC | PRN
  Administered 2024-09-15: 5 mL via INTRADERMAL

## 2024-09-15 MED ORDER — SODIUM CHLORIDE 0.9 % IV SOLN
250.0000 mL | INTRAVENOUS | Status: AC | PRN
  Administered 2024-09-15: 250 mL via INTRAVENOUS

## 2024-09-15 MED ORDER — MIDAZOLAM HCL (PF) 2 MG/2ML IJ SOLN: 2.0000 mg | Freq: Once | INTRAMUSCULAR | Status: AC

## 2024-09-15 MED ORDER — PANTOPRAZOLE SODIUM 40 MG IV SOLR
40.0000 mg | Freq: Every day | INTRAVENOUS | Status: DC
  Administered 2024-09-15 – 2024-09-17 (×3): 40 mg via INTRAVENOUS
  Filled 2024-09-15 (×3): qty 10

## 2024-09-15 MED ORDER — VANCOMYCIN HCL IN DEXTROSE 1-5 GM/200ML-% IV SOLN
1000.0000 mg | INTRAVENOUS | Status: DC
  Administered 2024-09-15: 1000 mg via INTRAVENOUS
  Filled 2024-09-15: qty 200

## 2024-09-15 MED ORDER — CLOPIDOGREL BISULFATE 75 MG PO TABS
75.0000 mg | ORAL_TABLET | Freq: Every day | ORAL | Status: DC
  Administered 2024-09-16: 75 mg
  Filled 2024-09-15: qty 1

## 2024-09-15 MED ORDER — LABETALOL HCL 5 MG/ML IV SOLN: 10.0000 mg | INTRAVENOUS | Status: AC | PRN

## 2024-09-15 MED ORDER — FENTANYL CITRATE (PF) 50 MCG/ML IJ SOSY
PREFILLED_SYRINGE | INTRAMUSCULAR | Status: AC
  Administered 2024-09-15: 50 ug
  Filled 2024-09-15: qty 2

## 2024-09-15 NOTE — Progress Notes (Signed)
"  °  Progress Note Patient Name: Alyssa Hudson Date of Encounter: 09/15/2024 [facility] Cardiologist: Kardie Tobb, DO   Interval Summary   No events overnight. She reports some improvement in dyspnea today, though still struggled this AM when she tried to stand up. She denies orthopnea, PND, or CP.  Vital Signs Vitals:   09/15/24 0420 09/15/24 0500 09/15/24 0548 09/15/24 0742  BP: (!) 132/42   (!) 148/46  Pulse: (!) 50   (!) 48  Resp: (!) 22   (!) 25  Temp: 97.6 F (36.4 C)   97.8 F (36.6 C)  TempSrc: Oral   Oral  SpO2: 99%   100%  Weight:  70.6 kg 70.6 kg   Height:       No intake or output data in the 24 hours ending 09/15/24 0952    09/15/2024    5:48 AM 09/15/2024    5:00 AM 09/14/2024    9:00 PM  Last 3 Weights  Weight (lbs) 155 lb 11.2 oz 155 lb 11.2 oz 155 lb 12.8 oz  Weight (kg) 70.625 kg 70.625 kg 70.67 kg      Telemetry/ECG  NSR - Personally Reviewed  Physical Exam  GEN: No acute distress.   Neck: No JVD Cardiac: RRR, no murmurs, rubs, or gallops.  Respiratory: Clear to auscultation bilaterally. GI: Soft, nontender, non-distended  Alyssa: significant LLE hematoma  Assessment & Plan  Alyssa Hudson is a 73 yoF with Hx of HFpEF (55-60% 12/25), HTN, recent mechanical fall (1 week ago) who is presenting with dyspnea. She presented with worsening dyspnea and found to have trop elevation 195--> 198 --> 254. Ddx includes HFpEF exacerbation (given nonadherence to lasix , though not appear volume overloaded on exam), PE (given immobility and TTE with dilated RV, elevated RVSP 50, mod TR and dilated IVC), PNA (given leukocytosis and x-ray findings), ACS (less likely given no CP and initial flat troponin trend). S/p IV lasix  60 total and Abx with improvement in her dyspnea.    #Dyspnea w/ acute hypoxic respiratory failure #Troponin elevation #HFpEF w/ dilated RV and moderate TR #AKI - Would give another dose of IV lasix  40 x 1; patient is now on RA (from peak 15L  yesterday AM)  - Would recommend V/Q scan given dilated RV/elevated PASP, elevated troponin, and oxygen requirement - Cont empiric treatment with Heparin  gtt - D/c metoprolol  given intermittent bradycardia, can cause side effects in older patient, and poor choice for HTN management - Can consider outpatient ischemic evaluation following discharge - We will continue to follow  For questions or updates, please contact [facility] Please consult [email or web address] for contact info under   Signed, Joelle VEAR Ren Donley, MD  "

## 2024-09-15 NOTE — Significant Event (Signed)
 Rapid Response Event Note   Reason for Call :  Tachypnea on Bipap  Initial Focused Assessment:  On arrival, pt laying in bed with increased work of breathing on Bipap. She responds to voice, able to answer questions appropriately. Denies CP but states she is SOB.   VS: HR 96, BP 133/61, RR 44, spO2 97% on Bipap 50% fiO2    Interventions:  IV Lasix  give PTA CXR Cardiology and primary team to bedside Solumedrol CCM consulted and to bedside  Plan of Care:  Pt to go to cath lab then transfer to 2H.    Event Summary:   MD Notified: PTA Call Time: 1124 Arrival Time: 1140 End Time: 1318  Nat Blunt, RN

## 2024-09-15 NOTE — Plan of Care (Signed)

## 2024-09-15 NOTE — Assessment & Plan Note (Addendum)
 Remains afebrile. Weaning O2 from 15L to now 5L Alyssa Hudson. Cr stable after IV lasix , will cont IV diuresis per Cards recs. - Lasix  IV 40 once - Cont IV CTX and azithromycin  while on O2, consider transitioning to PO if resp status improves - Holding home metop given low HR - f/u VQ scan (may be limited given concurrent PNA) - f/u DVT US  - Cont empiric heparin  drip for potential PE - Card consulted, appreciate recs - Strict I/O's (not well charted ON) - Bcx NGTD <24hrs - AM CBC - Cont telemetry

## 2024-09-15 NOTE — Progress Notes (Signed)
 09/15/2024 Back from cath lab still labored breathing.  Gave versed  as patient endorsed anxiety, still labored.  CXR dense RLL PNA.  Pretty functional PTA.  Discussed with family and okay for short trial on vent.  If takes turn or kidneys fail they would want comfort.  Rolan Sharps MD PCCM

## 2024-09-15 NOTE — Assessment & Plan Note (Addendum)
 Stable elevated after IV Lasix  yesterday.  Will redose Lasix  per cards recs. - IV lasix  40 as above

## 2024-09-15 NOTE — Progress Notes (Addendum)
 Addendum: Assessment: -Repeat level drawn appropriately was subtherapeutic at 0.12. CBC is stable. No signs or symptoms of bleeding reported.  Plan: -Increase heparin  infusion to 1350 units/hr (~3.5units/kg increase) -Recheck heparin  level after 8 hours to confirm therapeutic -Monitor daily heparin  levels and CBC -Monitor for any signs/symptoms of bleeding  Thank you for allowing pharmacy to be involved with this patient's care.  Mendel Barter, PharmD PGY1 Clinical Pharmacist [facility]  09/15/2024 10:13 AM  ANTICOAGULATION CONSULT NOTE  Pharmacy Consult for Heparin  Indication: r/o PE  Allergies[1]  Patient Measurements: Height: 5' 6 (167.6 cm) Weight: 70.6 kg (155 lb 11.2 oz) IBW/kg (Calculated) : 59.3 Heparin  Dosing Weight: 71.7 kg  Vital Signs: Temp: 97.6 F (36.4 C) (12/21 0420) Temp Source: Oral (12/21 0420) BP: 132/42 (12/21 0420) Pulse Rate: 50 (12/21 0420)  Labs: Recent Labs    09/14/24 1004 09/14/24 1025 09/14/24 2140 09/15/24 0510  HGB  --  9.2*  --  8.4*  HCT  --  29.3*  --  26.4*  PLT  --  193  --  181  APTT  --   --  32  --   HEPARINUNFRC  --   --  <0.10* 0.16*  CREATININE 1.90*  --   --   --     Estimated Creatinine Clearance: 20.3 mL/min (A) (by C-G formula based on SCr of 1.9 mg/dL (H)).   Medical History: Past Medical History:  Diagnosis Date   Acute diastolic heart failure (HCC) 06/30/2020   Acute pericarditis 07/24/2020   Aftercare following surgery 06/09/2016   Aortic valve sclerosis 2021   Moderate per echo in 2021   Atrial fibrillation (HCC) 07/24/2020   BMI 33.0-33.9,adult    Carpal tunnel syndrome, right 08/13/2020   Cervical radiculopathy 08/13/2020   Cervical radiculopathy due to osteoarthritis of spine    Diverticulosis    Encephalopathy 06/19/2020   Facial droop    Generalized osteoarthritis of multiple sites    Hypertension    Hyponatremia 06/20/2020   Primary hypertension 07/24/2020    Tricompartment osteoarthritis of right knee    Ulnar 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 30 tablet 0 09/13/2024 Bedtime   aspirin  EC 325 MG tablet Take 325 mg by mouth daily as needed for moderate pain (pain score 4-6).   09/14/2024 Morning   aspirin  EC 81 MG tablet Take 81 mg by mouth daily. Swallow whole.   09/13/2024 Bedtime   Biotin 5000 MCG TABS Take 5,000 mcg by mouth daily.   09/13/2024 Morning   CRANBERRY PO Take 650 mg by mouth daily.   09/13/2024 Morning   furosemide  (LASIX ) 40 MG tablet Take 40 mg by mouth daily. Not taking on therapy days   09/09/2024   hydrochlorothiazide  (HYDRODIURIL ) 25 MG tablet Take 25 mg by mouth daily. Takes only on therapy days.   09/13/2024 Morning   losartan  (COZAAR ) 100 MG tablet Take 100 mg by mouth daily.   09/13/2024 Morning   MAGNESIUM  PO Take 400 mg by mouth daily.   09/13/2024 Bedtime   metoprolol  succinate (TOPROL -XL) 50 MG 24 hr tablet Take 1 tablet (50 mg total) by mouth daily. 90 tablet 3 09/13/2024 Morning   OVER THE COUNTER MEDICATION Take 4 capsules by mouth daily. MACULAR PROTECT   09/13/2024 Evening  Probiotic Product (PROBIOTIC PO) Take 1 tablet by mouth daily.   09/13/2024 Noon   Scheduled:   metoprolol  succinate  50 mg Oral Daily   Infusions:   azithromycin      cefTRIAXone  (ROCEPHIN )  IV     heparin  1,100 Units/hr (09/15/24 0006)   PRN:   Assessment: 83 yof with a history of HF, HTN. Patient is presenting with SOB. Heparin  per pharmacy consult placed for chest pain/ACS. Patient is not on anticoagulation prior to arrival.  Heparin  level 0.16 is subtherapeutic with heparin  running at 1100 units/hr. Hgb (8.4) and PLTs (181) are stable. Per RN, no report of pauses, issues with the line, or signs  of bleeding. Of note, level was drawn ~3 hours early, so heparin  is unlikely to be at steady state after last dose change. Will order a redraw for 0800, 8 hours from the last dose adjustment.   Goal of Therapy:  Heparin  level 0.3-0.7 units/ml Monitor platelets by anticoagulation protocol: Yes   Plan:  Will collect a new level at anticipated time of 0800 Adjust heparin  infusion based on new level as it will more accurately reflect stead-state concentration Continue to monitor for signs and symptoms of bleeding Daily heparin  level and CBC  Thank you for allowing pharmacy to be involved with this patient's care.  Mendel Barter, PharmD PGY1 Clinical Pharmacist [facility]  09/15/2024 7:52 AM     [1]  Allergies Allergen Reactions   Codeine Nausea Only and Nausea And Vomiting    Insomnia   Contrast Media [Iodinated Contrast Media] Other (See Comments)    Turned red all over   Iodine-131 Hives and Swelling   Sulfa Antibiotics Nausea Only

## 2024-09-15 NOTE — Progress Notes (Signed)
 Pharmacy Antibiotic Note  Alyssa Hudson is a 83 y.o. female admitted on 09/14/2024 with PNA. Pt has been on CAP coverage, now with worsening respiratory status pharmacy has been consulted for vancomycin  and Zosyn  dosing.  Plan: Zosyn  2.25g IV q8h over 30 minutes Vancomycin  1000mg  IV q48h - est AUC 467 Follow Cr, LOT, Cx Vancomycin  levels PRN   Height: 5' 6 (167.6 cm) Weight: 70.6 kg (155 lb 11.2 oz) IBW/kg (Calculated) : 59.3  Temp (24hrs), Avg:98.2 F (36.8 C), Min:97.6 F (36.4 C), Max:98.9 F (37.2 C)  Recent Labs  Lab 09/14/24 1004 09/14/24 1025 09/14/24 1224 09/14/24 1307 09/15/24 0510 09/15/24 1218  WBC  --  23.5*  --   --  21.0*  --   CREATININE 1.90*  --   --   --  1.89*  --   LATICACIDVEN  --   --  0.9 1.9  --  3.0*    Estimated Creatinine Clearance: 20.4 mL/min (A) (by C-G formula based on SCr of 1.89 mg/dL (H)).    Allergies[1]   Alyssa Hudson, PharmD, BCPS, [facility] Clinical Pharmacist [phone] Please check AMION for all [facility] numbers 09/15/2024     [1]  Allergies Allergen Reactions   Codeine Nausea Only and Nausea And Vomiting    Insomnia   Contrast Media [Iodinated Contrast Media] Other (See Comments)    Turned red all over   Iodine-131 Hives and Swelling   Sulfa Antibiotics Nausea Only

## 2024-09-15 NOTE — Progress Notes (Signed)
 Patient C/O SOB while satting @93 -97% on 4L [place] RR 45 BP 163/53 (81) temp 51F oral. Bipap placed. Resp Cardiology and Medicine team at bedside. Stat orders placed.

## 2024-09-15 NOTE — Progress Notes (Signed)
 Attempted x 3 to place foley. Unsuccessful with 3 attempts by 3 different RN's.  Will place purewick-external urinary device.

## 2024-09-15 NOTE — Assessment & Plan Note (Addendum)
 HTN: holding home metop and losartan 

## 2024-09-15 NOTE — Consult Note (Signed)
 "  NAME:  Alyssa Hudson, MRN:  [id number], DOB:  [birth year], LOS: 1 ADMISSION DATE:  09/14/2024, CONSULTATION DATE:  09/15/24 REFERRING MD:  BERNIS, CHIEF COMPLAINT:  Resp distress    History of Present Illness:  Alyssa Hudson is a 83 year old female w/ hx of afib, aortic stenosis presenting with 2 months of worsening SOB.  Got worse a week ago accompanied by anorexia and then went to ER.  Found to have RLL infiltrate, +SIRS criteria and mild trop leak, admitted to [facility].  Unfortunately developed worsening SOB this am and new EKG changes.  Cardiology evaluating for cath lab but resp status tenuous.  Her biggest complaint besides SOB is anxiety.  She feels better with BIPAP in place.  Pertinent  Medical History   Past Medical History:  Diagnosis Date   Acute diastolic heart failure (HCC) 06/30/2020   Acute pericarditis 07/24/2020   Aftercare following surgery 06/09/2016   Aortic valve sclerosis 2021   Moderate per echo in 2021   Atrial fibrillation (HCC) 07/24/2020   BMI 33.0-33.9,adult    Carpal tunnel syndrome, right 08/13/2020   Cervical radiculopathy 08/13/2020   Cervical radiculopathy due to osteoarthritis of spine    Diverticulosis    Encephalopathy 06/19/2020   Facial droop    Generalized osteoarthritis of multiple sites    Hypertension    Hyponatremia 06/20/2020   Primary hypertension 07/24/2020   Tricompartment osteoarthritis of right knee    Ulnar neuropathy at elbow, right 08/13/2020   Viral pericarditis with pericardial effusion 06/30/2020     Significant Hospital Events: Including procedures, antibiotic start and stop dates in addition to other pertinent events   12/20 admit   Interim History / Subjective:  Consult/admit  Objective    Blood pressure 133/61, pulse (!) 107, temperature 98 F (36.7 C), temperature source Oral, resp. rate (!) 46, height 5' 6 (1.676 m), weight 70.6 kg, SpO2 98%.    FiO2 (%):  [50 %] 50 % PEEP:  [5 cmH20] 5 cmH20 Pressure  Support:  [5 cmH20-7 cmH20] 5 cmH20  No intake or output data in the 24 hours ending 09/15/24 1239 Filed Weights   09/14/24 2100 09/15/24 0500 09/15/24 0548  Weight: 70.7 kg 70.6 kg 70.6 kg    Examination: General: ill appearing HENT: bipap good seal, good tidal volumes Lungs: minimal rhonci on R, tachypneic Cardiovascular: regular ext, warm Abdomen: soft, +BS Extremities: minimal edema Neuro: moves everything, anxious Skin: Pale, age-related changes  ABG hypoxemia CXR RLL strange looking PNA  Resolved problem list   Assessment and Plan   Acute hypoxemic respiratory failure, acute ST changes- with RLL infiltrate but background 2 months of worsening SOB, CT chest 12/4 with normal appearing lungs.  SOB could be anginal equivalent and we are dealing with ACS + pneumonia.  Anxiety overlay complicating picture.  Echo yesterday not c/w large acute PE but can repeat a bedside after cath lab.  Dye allergy- ~30 years ago, getting steroids before cath lab  Worsened shock state- lactate 3  Abx to vanc/zosyn /azithro Can lay flat on her back: I'm okay trying light sedation for cath, can intubate if needed (family has reversed code status) but would prefer to avoid complications of induction if we are able Continue BIPAP With bradycardia cannot do precedex  Hopefully can get RHC numbers as well To ICU post cath, will take over as primary Family updated at bedside  Labs   CBC: Recent Labs  Lab 09/14/24 1025 09/15/24 0510  WBC 23.5* 21.0*  NEUTROABS 16.1*  --  HGB 9.2* 8.4*  HCT 29.3* 26.4*  MCV 90.7 89.8  PLT 193 181    Basic Metabolic Panel: Recent Labs  Lab 09/14/24 1004 09/15/24 0510  NA 132* 136  K 4.6 4.4  CL 96* 98  CO2 21* 19*  GLUCOSE 143* 114*  BUN 53* 61*  CREATININE 1.90* 1.89*  CALCIUM  8.6* 8.7*  MG  --  2.6*   GFR: Estimated Creatinine Clearance: 20.4 mL/min (A) (by C-G formula based on SCr of 1.89 mg/dL (H)). Recent Labs  Lab 09/14/24 1025  09/14/24 1224 09/14/24 1307 09/15/24 0510  WBC 23.5*  --   --  21.0*  LATICACIDVEN  --  0.9 1.9  --     Liver Function Tests: Recent Labs  Lab 09/14/24 1004  AST 24  ALT 10  ALKPHOS 85  BILITOT 0.8  PROT 6.6  ALBUMIN 3.8   No results for input(s): LIPASE, AMYLASE in the last 168 hours. No results for input(s): AMMONIA in the last 168 hours.  ABG    Component Value Date/Time   HCO3 20.7 09/15/2024 1218   ACIDBASEDEF 3.8 (H) 09/15/2024 1218   O2SAT 89.4 09/15/2024 1218     Coagulation Profile: No results for input(s): INR, PROTIME in the last 168 hours.  Cardiac Enzymes: No results for input(s): CKTOTAL, CKMB, CKMBINDEX, TROPONINI in the last 168 hours.  HbA1C: Hgb A1c MFr Bld  Date/Time Value Ref Range Status  11/18/2020 11:17 AM 5.4 4.8 - 5.6 % Final    Comment:             Prediabetes: 5.7 - 6.4          Diabetes: >6.4          Glycemic control for adults with diabetes: <7.0   06/20/2020 08:43 AM 6.1 (H) 4.8 - 5.6 % Final    Comment:    (NOTE) Pre diabetes:          5.7%-6.4%  Diabetes:              >6.4%  Glycemic control for   <7.0% adults with diabetes     CBG: No results for input(s): GLUCAP in the last 168 hours.  Review of Systems:   Too SOB on BIPAP  Past Medical History:  She,  has a past medical history of Acute diastolic heart failure (HCC) (89/94/7978), Acute pericarditis (07/24/2020), Aftercare following surgery (06/09/2016), Aortic valve sclerosis (2021), Atrial fibrillation (HCC) (07/24/2020), BMI 33.0-33.9,adult, Carpal tunnel syndrome, right (08/13/2020), Cervical radiculopathy (08/13/2020), Cervical radiculopathy due to osteoarthritis of spine, Diverticulosis, Encephalopathy (06/19/2020), Facial droop, Generalized osteoarthritis of multiple sites, Hypertension, Hyponatremia (06/20/2020), Primary hypertension (07/24/2020), Tricompartment osteoarthritis of right knee, Ulnar 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   Surgical History:   Past Surgical History:  Procedure Laterality Date   CARPAL TUNNEL RELEASE Right 10/26/2020   CATARACT EXTRACTION  2003   insert prosthetic lens   CHOLECYSTECTOMY  01/1997   IR FLUORO GUIDED NEEDLE PLC ASPIRATION/INJECTION LOC  06/23/2020   RADIOLOGY WITH ANESTHESIA N/A 06/23/2020   Procedure: IR WITH ANESTHESIA;  Surgeon: Radiologist, Medication, MD;  Location: [facility] OR;  Service: Radiology;  Laterality: N/A;   TOTAL HYSTERECTOMY  02/1997     Social History:   reports that she has never smoked. She has never used smokeless tobacco. She reports that she does not drink alcohol and does not use drugs.   Family History:  Her family history includes Alcohol abuse in her brother; Alzheimer's disease (  age of onset: 25) in her sister; CVA in her mother; Hypertension in her father and mother; Muscular dystrophy in her brother; Prostate cancer (age of onset: 16) in her father; Stroke in her brother.   Allergies Allergies[1]   Home Medications  Prior to Admission medications  Medication Sig Start Date End Date Taking? Authorizing Provider  amLODipine  (NORVASC ) 10 MG tablet Take 1 tablet (10 mg total) by mouth daily. 07/02/20  Yes Carolynn Standing, MD  aspirin  EC 325 MG tablet Take 325 mg by mouth daily as needed for moderate pain (pain score 4-6).   Yes [provider]  aspirin  EC 81 MG tablet Take 81 mg by mouth daily. Swallow whole.   Yes [provider]  Biotin 5000 MCG TABS Take 5,000 mcg by mouth daily.   Yes [provider]  CRANBERRY PO Take 650 mg by mouth daily.   Yes [provider]  furosemide  (LASIX ) 40 MG tablet Take 40 mg by mouth daily. Not taking on therapy days   Yes [provider]  hydrochlorothiazide  (HYDRODIURIL ) 25 MG tablet Take 25 mg by mouth daily. Takes only on therapy days.   Yes [provider]  losartan  (COZAAR ) 100 MG tablet Take 100 mg  by mouth daily. 03/07/20  Yes [provider]  MAGNESIUM  PO Take 400 mg by mouth daily.   Yes [provider]  metoprolol  succinate (TOPROL -XL) 50 MG 24 hr tablet Take 1 tablet (50 mg total) by mouth daily. 08/27/24  Yes Revankar, Jennifer SAUNDERS, MD  OVER THE COUNTER MEDICATION Take 4 capsules by mouth daily. MACULAR PROTECT   Yes [provider]  Probiotic Product (PROBIOTIC PO) Take 1 tablet by mouth daily.   Yes [provider]     Critical care time: 34 mins independent of procedures              [1]  Allergies Allergen Reactions   Codeine Nausea Only and Nausea And Vomiting    Insomnia   Contrast Media [Iodinated Contrast Media] Other (See Comments)    Turned red all over   Iodine-131 Hives and Swelling   Sulfa Antibiotics Nausea Only   "

## 2024-09-15 NOTE — Plan of Care (Signed)

## 2024-09-15 NOTE — Progress Notes (Signed)
" °   09/15/24 0005  Assess: MEWS Score  Temp 98 F (36.7 C)  BP (!) 125/43  MAP (mmHg) 67  Pulse Rate (!) 46  ECG Heart Rate (!) 108  Resp (!) 22  SpO2 95 %  O2 Device Nasal Cannula  O2 Flow Rate (L/min) 4 L/min  Assess: MEWS Score  MEWS Temp 0  MEWS Systolic 0  MEWS Pulse 1  MEWS RR 1  MEWS LOC 0  MEWS Score 2  MEWS Score Color Yellow  Assess: if the MEWS score is Yellow or Red  Were vital signs accurate and taken at a resting state? Yes  Does the patient meet 2 or more of the SIRS criteria? Yes  MEWS guidelines implemented  Yes, yellow  Treat  MEWS Interventions Considered administering scheduled or prn medications/treatments as ordered  Take Vital Signs  Increase Vital Sign Frequency  Yellow: Q2hr x1, continue Q4hrs until patient remains green for 12hrs  Escalate  MEWS: Escalate Yellow: Discuss with charge nurse and consider notifying provider and/or RRT  Notify: Charge Nurse/RN  Name of Charge Nurse/RN Notified Matthew, RN  Assess: SIRS CRITERIA  SIRS Temperature  0  SIRS Respirations  1  SIRS Pulse 1  SIRS WBC 0  SIRS Score Sum  2    "

## 2024-09-15 NOTE — Progress Notes (Addendum)
 "   Rounding Note    Patient Name: Alyssa Hudson Date of Encounter: 09/15/2024  [facility] Cardiologist: Kardie Tobb, DO  Subjective   Rapid response called due to respiratory distress and chest tightness cardiology asked to come reevaluate the patient. Patient resting in bed with BiPAP with shallow breathing Son keep and has been alert at bedside Patient stated that she had acute onset of chest pain this morning and since then has been short of breath.   Inpatient Medications    Scheduled Meds:  methylPREDNISolone  (SOLU-MEDROL ) injection  125 mg Intravenous STAT   Continuous Infusions:  azithromycin  500 mg (09/15/24 1012)   cefTRIAXone  (ROCEPHIN )  IV 2 g (09/15/24 0933)   heparin  1,350 Units/hr (09/15/24 1009)   PRN Meds: acetaminophen , ipratropium-albuterol    Vital Signs    Vitals:   09/15/24 1059 09/15/24 1100 09/15/24 1112 09/15/24 1200  BP:   133/61   Pulse: (!) 103 (!) 56 (!) 108 (!) 107  Resp: (!) 47 (!) 36 (!) 39 (!) 46  Temp:   98 F (36.7 C)   TempSrc:   Oral   SpO2: 97% 97% 97% 98%  Weight:      Height:       No intake or output data in the 24 hours ending 09/15/24 1223    09/15/2024    5:48 AM 09/15/2024    5:00 AM 09/14/2024    9:00 PM  Last 3 Weights  Weight (lbs) 155 lb 11.2 oz 155 lb 11.2 oz 155 lb 12.8 oz  Weight (kg) 70.625 kg 70.625 kg 70.67 kg   ECG    09/15/2024: Sinus tachycardia, appearance 70 bpm, first-degree AV block, inferior and lateral ST depressions downsloping- Personally Reviewed  Physical Exam   General: Age-appropriate, hemodynamically stable, acute distress on BiPAP HEENT: Normocephalic, atraumatic, trachea midline, JVP Lungs: Rales noted bilaterally, no expiratory wheezing Heart: Tachycardia, no murmurs rubs or gallops appreciated secondary to tachycardia Abdomen: Soft, nontender, nondistended, shallow breathing Extremities: Left lower extremity bruising noted from knee and distal, right lower extremity  warm and no swelling, pulses difficult to appreciate Neuro: Alert and oriented x 3, answers questions appropriately  Labs    High Sensitivity Troponin:  No results for input(s): TROPONINIHS in the last 720 hours.   Chemistry Recent Labs  Lab 09/14/24 1004 09/15/24 0510  NA 132* 136  K 4.6 4.4  CL 96* 98  CO2 21* 19*  GLUCOSE 143* 114*  BUN 53* 61*  CREATININE 1.90* 1.89*  CALCIUM  8.6* 8.7*  MG  --  2.6*  PROT 6.6  --   ALBUMIN 3.8  --   AST 24  --   ALT 10  --   ALKPHOS 85  --   BILITOT 0.8  --   GFRNONAA 25* 26*  ANIONGAP 15 18*    Lipids No results for input(s): CHOL, TRIG, HDL, LABVLDL, LDLCALC, CHOLHDL in the last 168 hours.  Hematology Recent Labs  Lab 09/14/24 1025 09/15/24 0510  WBC 23.5* 21.0*  RBC 3.23* 2.94*  HGB 9.2* 8.4*  HCT 29.3* 26.4*  MCV 90.7 89.8  MCH 28.5 28.6  MCHC 31.4 31.8  RDW 15.0 15.2  PLT 193 181   Thyroid   Recent Labs  Lab 09/14/24 1004  TSH 3.910    BNP Recent Labs  Lab 09/14/24 1004  PROBNP 18,334.0*    DDimer No results for input(s): DDIMER in the last 168 hours.    Cardiac Studies   Echocardiogram 09/14/2024 reviewed  Patient Profile  83 y.o. female history of HFpEF, hypertension, recent mechanical fall a week ago presents with dyspnea.  Assessment & Plan    NSTEMI Acute onset of chest tightness this morning. Followed by worsening respiratory distress placed on BiPAP. EKG notes sinus tachycardia with diffuse ST depressions in inferolateral leads. Hemodynamically stable maintaining a blood pressure around 110 mmHg. Intermittent chest discomfort -if pain continues consider nitro drip Repeat limited echocardiogram. High sensitive troponins Prolonged discussion with the patient's son Alyssa Hudson, husband, and niece who are all present at bedside.  Given her symptoms, EKG changes, my concern is evolving ACS.  Other contributing/confounding factors advanced age, DNR at baseline (understanding will be  revoked if pursuing further management), CKD, and respiratory distress. Spoke to the on-call interventionalists Dr. Wonda who agrees that she has dynamic EKG changes consistent with NSTEMI.  However, she is at high risk for contrast-induced nephropathy requiring temporary or permanent dialysis at her current renal function, also leukocytosis, and worsening of shortness of breath and respiratory distress may need temporizing ventilation with ventilator.  Pretest probability of multivessel CAD very high.  Patient's son Alyssa Hudson is going to call his other brother to help make an informed decision regarding her mother's care.  I stressed to them that proceeding forward with angiography places her at risk for complications as mentioned above and not proceeding forward with angiography also leads to other comorbid conditions such as arrhythmias and cardiomyopathy and heart failure.  If conservative approach is favored I could reach out to ICU for further assistance given her respiratory needs and palliative care consult.  Her son is a former EMT provider and very well aware of the potential complications either way. They are requesting time to discuss this further with family before proceeding forward.  Primary team -residents were available during the discussion as well.  For questions or updates, please contact [facility] Please consult [email or web address] for contact info under   CRITICAL CARE Performed by: Madonna Large   Total critical care time: 40 minutes   Critical care time was exclusive of separately billable procedures and treating other patients.   Critical care was necessary to treat or prevent imminent or life-threatening deterioration.   Critical care was time spent personally by me on the following activities: development of treatment plan with patient and/or surrogate as well as nursing, discussions with consultants, evaluation of patient's response to treatment, examination of  patient, obtaining history from patient or surrogate, ordering and performing treatments and interventions, ordering and review of laboratory studies, ordering and review of radiographic studies, pulse oximetry and re-evaluation of patient's condition.  Signed, Madonna Large HAS, [facility]  A Division of [facility] [address] [place], [place] [address]  Pager: [phone] Office: [phone] 09/15/2024, 12:23 PM      Addendum:  Alyssa Hudson - said that family has discussed the both options and also spoke to the patient. Patient wants to go forward with heart catheterization.   Patient and family understand the potential complications of which includes but not limited to bleeding, infection, vascular injury, stroke, myocardial infarction, arrhythmia (requiring medical or cardiopulmonary resuscitation), kidney injury (requiring short-term or long-term hemodialysis), radiation-related injury in the case of prolonged fluoroscopy use, emergent cardiac surgery, temporary or permanent pacemaker, and death. Family (son and husband) verbalized understanding.   DNR will be revoked for now.  Family in agreement.  Dr. Wonda updated.  Solu-Medrol  ordered for contrast allergy.  Care link called to page the team for an  emergent NSTEMI   Spoke to rapid response nurse to consult CCM - ideally before the cath.   Dr. Michele 12:23 PM  "

## 2024-09-15 NOTE — Procedures (Signed)
 Central Venous Catheter Insertion Procedure Note  Alyssa Hudson  [id number]  19-Apr-1941  Date:09/15/2024  Time:5:12 PM   Provider Performing:Kasch Borquez [name]   Procedure: Insertion of Non-tunneled Central Venous Catheter(36556) with US  guidance (23062)   Indication(s) Difficult access  Consent Risks of the procedure as well as the alternatives and risks of each were explained to the patient and/or caregiver.  Consent for the procedure was obtained and is signed in the bedside chart  Anesthesia Topical only with 1% lidocaine    Timeout Verified patient identification, verified procedure, site/side was marked, verified correct patient position, special equipment/implants available, medications/allergies/relevant history reviewed, required imaging and test results available.  Sterile Technique Maximal sterile technique including full sterile barrier drape, hand hygiene, sterile gown, sterile gloves, mask, hair covering, sterile ultrasound probe cover (if used).  Procedure Description Area of catheter insertion was cleaned with chlorhexidine  and draped in sterile fashion.  With real-time ultrasound guidance a central venous catheter was placed into the left internal jugular vein. Nonpulsatile blood flow and easy flushing noted in all ports.  The catheter was sutured in place and sterile dressing applied.  Complications/Tolerance None; patient tolerated the procedure well. Chest X-ray is ordered to verify placement for internal jugular or subclavian cannulation.   Chest x-ray is not ordered for femoral cannulation.  EBL Minimal  Specimen(s) None

## 2024-09-15 NOTE — Progress Notes (Signed)
" ° ° ° °  Daily Progress Note Intern Pager: [phone]  Patient name: Alyssa Hudson Medical record number: [id number] Date of birth: 05/16/1941 Age: 83 y.o. Gender: female  Primary Care Provider: [name], MD Consultants: Neurology, Code Status: DNR  Pt Overview and Major Events to Date:  12/20: Admitted  Assessment and Plan:  83 year old F with history of hypertension, A-fib, cervical radiculopathy that is admitted for pneumonia and PE rule out.  Assessment & Plan Respiratory distress Sepsis (HCC) Pneumonia CHF exacerbation (HCC) Chest pain Remains afebrile. Weaning O2 from 15L to now 5L [place]. Cr stable after IV lasix , will cont IV diuresis per Cards recs. - Lasix  IV 40 once - Cont IV CTX and azithromycin  while on O2, consider transitioning to PO if resp status improves - Holding home metop given low HR - f/u VQ scan (may be limited given concurrent PNA) - f/u DVT US  - Cont empiric heparin  drip for potential PE - Card consulted, appreciate recs - Strict I/O's (not well charted ON) - Bcx NGTD <24hrs - AM CBC - Cont telemetry AKI (acute kidney injury) Stable elevated after IV Lasix  yesterday.  Will redose Lasix  per cards recs. - IV lasix  40 as above Chronic health problem HTN: holding home metop and losartan       FEN/GI: Heart healthy PPx: Heparin  drip Dispo: Pending clinical improvement.  Requires hospitalization for new oxygen requirement and IV medications.  Subjective:  Reports feeling much better than yesterday. SOB improving. Denies CP. Felt nauseous, but no vomiting this AM.   Objective: Temp:  [97.1 F (36.2 C)-98.7 F (37.1 C)] 97.8 F (36.6 C) (12/21 0742) Pulse Rate:  [44-93] 48 (12/21 0742) Resp:  [16-38] 25 (12/21 0742) BP: (122-149)/(42-63) 148/46 (12/21 0742) SpO2:  [91 %-100 %] 100 % (12/21 0742) FiO2 (%):  [50 %] 50 % (12/20 1928) Weight:  [70.6 kg-71.7 kg] 70.6 kg (12/21 0548) Physical Exam: General: Alert, pleasant woman sitting up  and speaking in full sentences. NAD. Hard of hearing.  Cardiovascular: RRR Respiratory: Decreased breath sounds in all lung fields.  On 5 L nasal cannula Abdomen: Soft, Nondistended, nontender.  Laboratory: Most recent CBC Lab Results  Component Value Date   WBC 21.0 (H) 09/15/2024   HGB 8.4 (L) 09/15/2024   HCT 26.4 (L) 09/15/2024   MCV 89.8 09/15/2024   PLT 181 09/15/2024   Most recent BMP    Latest Ref Rng & Units 09/15/2024    5:10 AM  BMP  Glucose 70 - 99 mg/dL 885   BUN 8 - 23 mg/dL 61   Creatinine 9.55 - 1.00 mg/dL 8.10   Sodium 864 - 854 mmol/L 136   Potassium 3.5 - 5.1 mmol/L 4.4   Chloride 98 - 111 mmol/L 98   CO2 22 - 32 mmol/L 19   Calcium  8.9 - 10.3 mg/dL 8.7      Elicia Hamlet, MD 09/15/2024, 8:05 AM  PGY-3, [facility] FPTS Intern pager: [phone], text pages welcome Secure chat group [facility] Teaching Service   "

## 2024-09-15 NOTE — Interval H&P Note (Signed)
 History and Physical Interval Note:  09/15/2024 1:21 PM  Alyssa Hudson  has presented today for surgery, with the diagnosis of NSTEMI.  The various methods of treatment have been discussed with the patient and family. After consideration of risks, benefits and other options for treatment, the patient has consented to  Procedures: LEFT HEART CATH AND CORONARY ANGIOGRAPHY (N/A) as a surgical intervention.  The patient's history has been reviewed, patient examined, no change in status, stable for surgery.  I have reviewed the patient's chart and labs.  Questions were answered to the patient's satisfaction.    Pt interviewed and evaluated. Talked with family. Agree with plan as outlined above. Premedicated with IV solumedrol for contrast allergy.  Poor radial pulse. Plan femoral access.   I have reviewed the risks, indications, and alternatives to cardiac catheterization, possible angioplasty, and stenting with the patient. Risks include but are not limited to bleeding, infection, vascular injury, stroke, myocardial infection, arrhythmia, kidney injury, radiation-related injury in the case of prolonged fluoroscopy use, emergency cardiac surgery, and death. The patient understands the risks of serious complication is 1-2 in 1000 with diagnostic cardiac cath and 1-2% or less with angioplasty/stenting.  They understand her increased risk with advanced age, AKI, respiratory failure.  Alyssa Hudson

## 2024-09-15 NOTE — Plan of Care (Signed)
 FMTS Interim Progress Note  S:Notified that pt was having acute detioration of resp status and was placed back on BiPap. Went to room w/ Dr. Alena and Dr. Orie. Cardiology was also at bedside, discussing options including cath vs comfort care. Also discussed with family at bedside if they would want intubation. Family currently discussing with each other about GOC. Rapid response nurse also at bedside.   O: BP 133/61 (BP Location: Right Arm)   Pulse (!) 107   Temp 98 F (36.7 C) (Oral)   Resp (!) 46   Ht 5' 6 (1.676 m)   Wt 70.6 kg   SpO2 98%   BMI 25.13 kg/m     A/P:  AHRF Was doing well on 5L  earlier this AM, but then acutely detiorated and now on Bipap. Repeat CXR appears stable, no new consolidation thus ARDS, flash pulm edema, pneumothorax, aspiration less likely. C/f acute worsening of ACS or PE, although pt has been on heparin  drip. - Stat VBG, trops - Added solumedrol IV, duonebs for potential RAD component.  - CCM consulted    Elicia Hamlet, MD 09/15/2024, 12:14 PM PGY-3, [facility] Family Medicine Service pager [phone]

## 2024-09-15 NOTE — Procedures (Signed)
 Intubation Procedure Note  Alyssa Hudson  [id number]  Aug 14, 1941  Date:09/15/2024  Time:5:11 PM   Provider Performing:Raiquan Chandler C Claudene    Procedure: Intubation (31500)  Indication(s) Respiratory Failure  Consent Risks of the procedure as well as the alternatives and risks of each were explained to the patient and/or caregiver.  Consent for the procedure was obtained and is signed in the bedside chart   Anesthesia Etomidate , Versed , Fentanyl , and Rocuronium    Time Out Verified patient identification, verified procedure, site/side was marked, verified correct patient position, special equipment/implants available, medications/allergies/relevant history reviewed, required imaging and test results available.   Sterile Technique Usual hand hygeine, masks, and gloves were used   Procedure Description Patient positioned in bed supine.  Sedation given as noted above.  Patient was intubated with endotracheal tube using Glidescope.  View was Grade 1 full glottis .  Number of attempts was 2.  Colorimetric CO2 detector was consistent with tracheal placement.   Complications/Tolerance None; patient tolerated the procedure well. Chest X-ray is ordered to verify placement.   EBL Minimal   Specimen(s) None

## 2024-09-15 NOTE — Progress Notes (Signed)
 Pt transported from 3E07 to Cath lab via BiPAP without complications.

## 2024-09-15 NOTE — Assessment & Plan Note (Addendum)
 Remains afebrile. Weaning O2 from 15L to now 5L [place]. Cr stable after IV lasix , will cont IV diuresis per Cards recs. - Lasix  IV 40 once - Cont IV CTX and azithromycin  while on O2, consider transitioning to PO if resp status improves - Holding home metop given low HR - f/u VQ scan (may be limited given concurrent PNA) - f/u DVT US  - Cont empiric heparin  drip for potential PE - Card consulted, appreciate recs - Strict I/O's (not well charted ON) - Bcx NGTD <24hrs - AM CBC - Cont telemetry

## 2024-09-15 NOTE — Progress Notes (Signed)
 RT assisted with patient transport on Bipap from cath lab to 2H06 without complications.

## 2024-09-15 NOTE — H&P (View-Only) (Signed)
 "   Rounding Note    Patient Name: Alyssa Hudson Date of Encounter: 09/15/2024  [facility] Cardiologist: Kardie Tobb, DO  Subjective   Rapid response called due to respiratory distress and chest tightness cardiology asked to come reevaluate the patient. Patient resting in bed with BiPAP with shallow breathing Son keep and has been alert at bedside Patient stated that she had acute onset of chest pain this morning and since then has been short of breath.   Inpatient Medications    Scheduled Meds:  methylPREDNISolone  (SOLU-MEDROL ) injection  125 mg Intravenous STAT   Continuous Infusions:  azithromycin  500 mg (09/15/24 1012)   cefTRIAXone  (ROCEPHIN )  IV 2 g (09/15/24 0933)   heparin  1,350 Units/hr (09/15/24 1009)   PRN Meds: acetaminophen , ipratropium-albuterol    Vital Signs    Vitals:   09/15/24 1059 09/15/24 1100 09/15/24 1112 09/15/24 1200  BP:   133/61   Pulse: (!) 103 (!) 56 (!) 108 (!) 107  Resp: (!) 47 (!) 36 (!) 39 (!) 46  Temp:   98 F (36.7 C)   TempSrc:   Oral   SpO2: 97% 97% 97% 98%  Weight:      Height:       No intake or output data in the 24 hours ending 09/15/24 1223    09/15/2024    5:48 AM 09/15/2024    5:00 AM 09/14/2024    9:00 PM  Last 3 Weights  Weight (lbs) 155 lb 11.2 oz 155 lb 11.2 oz 155 lb 12.8 oz  Weight (kg) 70.625 kg 70.625 kg 70.67 kg   ECG    09/15/2024: Sinus tachycardia, appearance 70 bpm, first-degree AV block, inferior and lateral ST depressions downsloping- Personally Reviewed  Physical Exam   General: Age-appropriate, hemodynamically stable, acute distress on BiPAP HEENT: Normocephalic, atraumatic, trachea midline, JVP Lungs: Rales noted bilaterally, no expiratory wheezing Heart: Tachycardia, no murmurs rubs or gallops appreciated secondary to tachycardia Abdomen: Soft, nontender, nondistended, shallow breathing Extremities: Left lower extremity bruising noted from knee and distal, right lower extremity  warm and no swelling, pulses difficult to appreciate Neuro: Alert and oriented x 3, answers questions appropriately  Labs    High Sensitivity Troponin:  No results for input(s): TROPONINIHS in the last 720 hours.   Chemistry Recent Labs  Lab 09/14/24 1004 09/15/24 0510  NA 132* 136  K 4.6 4.4  CL 96* 98  CO2 21* 19*  GLUCOSE 143* 114*  BUN 53* 61*  CREATININE 1.90* 1.89*  CALCIUM  8.6* 8.7*  MG  --  2.6*  PROT 6.6  --   ALBUMIN 3.8  --   AST 24  --   ALT 10  --   ALKPHOS 85  --   BILITOT 0.8  --   GFRNONAA 25* 26*  ANIONGAP 15 18*    Lipids No results for input(s): CHOL, TRIG, HDL, LABVLDL, LDLCALC, CHOLHDL in the last 168 hours.  Hematology Recent Labs  Lab 09/14/24 1025 09/15/24 0510  WBC 23.5* 21.0*  RBC 3.23* 2.94*  HGB 9.2* 8.4*  HCT 29.3* 26.4*  MCV 90.7 89.8  MCH 28.5 28.6  MCHC 31.4 31.8  RDW 15.0 15.2  PLT 193 181   Thyroid   Recent Labs  Lab 09/14/24 1004  TSH 3.910    BNP Recent Labs  Lab 09/14/24 1004  PROBNP 18,334.0*    DDimer No results for input(s): DDIMER in the last 168 hours.    Cardiac Studies   Echocardiogram 09/14/2024 reviewed  Patient Profile  83 y.o. female history of HFpEF, hypertension, recent mechanical fall a week ago presents with dyspnea.  Assessment & Plan    NSTEMI Acute onset of chest tightness this morning. Followed by worsening respiratory distress placed on BiPAP. EKG notes sinus tachycardia with diffuse ST depressions in inferolateral leads. Hemodynamically stable maintaining a blood pressure around 110 mmHg. Intermittent chest discomfort -if pain continues consider nitro drip Repeat limited echocardiogram. High sensitive troponins Prolonged discussion with the patient's son Alyssa Hudson, husband, and niece who are all present at bedside.  Given her symptoms, EKG changes, my concern is evolving ACS.  Other contributing/confounding factors advanced age, DNR at baseline (understanding will be  revoked if pursuing further management), CKD, and respiratory distress. Spoke to the on-call interventionalists Dr. Wonda who agrees that she has dynamic EKG changes consistent with NSTEMI.  However, she is at high risk for contrast-induced nephropathy requiring temporary or permanent dialysis at her current renal function, also leukocytosis, and worsening of shortness of breath and respiratory distress may need temporizing ventilation with ventilator.  Pretest probability of multivessel CAD very high.  Patient's son Alyssa Hudson is going to call his other brother to help make an informed decision regarding her mother's care.  I stressed to them that proceeding forward with angiography places her at risk for complications as mentioned above and not proceeding forward with angiography also leads to other comorbid conditions such as arrhythmias and cardiomyopathy and heart failure.  If conservative approach is favored I could reach out to ICU for further assistance given her respiratory needs and palliative care consult.  Her son is a former EMT provider and very well aware of the potential complications either way. They are requesting time to discuss this further with family before proceeding forward.  Primary team -residents were available during the discussion as well.  For questions or updates, please contact [facility] Please consult [email or web address] for contact info under   CRITICAL CARE Performed by: Madonna Large   Total critical care time: 40 minutes   Critical care time was exclusive of separately billable procedures and treating other patients.   Critical care was necessary to treat or prevent imminent or life-threatening deterioration.   Critical care was time spent personally by me on the following activities: development of treatment plan with patient and/or surrogate as well as nursing, discussions with consultants, evaluation of patient's response to treatment, examination of  patient, obtaining history from patient or surrogate, ordering and performing treatments and interventions, ordering and review of laboratory studies, ordering and review of radiographic studies, pulse oximetry and re-evaluation of patient's condition.  Signed, Madonna Large HAS, [facility]  A Division of [facility] [address] [place], [place] [address]  Pager: [phone] Office: [phone] 09/15/2024, 12:23 PM      Addendum:  Alyssa Hudson - said that family has discussed the both options and also spoke to the patient. Patient wants to go forward with heart catheterization.   Patient and family understand the potential complications of which includes but not limited to bleeding, infection, vascular injury, stroke, myocardial infarction, arrhythmia (requiring medical or cardiopulmonary resuscitation), kidney injury (requiring short-term or long-term hemodialysis), radiation-related injury in the case of prolonged fluoroscopy use, emergent cardiac surgery, temporary or permanent pacemaker, and death. Family (son and husband) verbalized understanding.   DNR will be revoked for now.  Family in agreement.  Dr. Wonda updated.  Solu-Medrol  ordered for contrast allergy.  Care link called to page the team for an  emergent NSTEMI   Spoke to rapid response nurse to consult CCM - ideally before the cath.   Dr. Michele 12:23 PM  "

## 2024-09-16 ENCOUNTER — Inpatient Hospital Stay (HOSPITAL_COMMUNITY)

## 2024-09-16 DIAGNOSIS — R579 Shock, unspecified: Secondary | ICD-10-CM

## 2024-09-16 DIAGNOSIS — F419 Anxiety disorder, unspecified: Secondary | ICD-10-CM

## 2024-09-16 DIAGNOSIS — I214 Non-ST elevation (NSTEMI) myocardial infarction: Secondary | ICD-10-CM | POA: Diagnosis not present

## 2024-09-16 DIAGNOSIS — N189 Chronic kidney disease, unspecified: Secondary | ICD-10-CM

## 2024-09-16 DIAGNOSIS — D62 Acute posthemorrhagic anemia: Secondary | ICD-10-CM

## 2024-09-16 DIAGNOSIS — D631 Anemia in chronic kidney disease: Secondary | ICD-10-CM

## 2024-09-16 DIAGNOSIS — I2109 ST elevation (STEMI) myocardial infarction involving other coronary artery of anterior wall: Secondary | ICD-10-CM

## 2024-09-16 DIAGNOSIS — M7989 Other specified soft tissue disorders: Secondary | ICD-10-CM | POA: Diagnosis not present

## 2024-09-16 DIAGNOSIS — M7052 Other bursitis of knee, left knee: Secondary | ICD-10-CM

## 2024-09-16 DIAGNOSIS — J9601 Acute respiratory failure with hypoxia: Secondary | ICD-10-CM | POA: Diagnosis not present

## 2024-09-16 DIAGNOSIS — N1832 Chronic kidney disease, stage 3b: Secondary | ICD-10-CM

## 2024-09-16 DIAGNOSIS — I2510 Atherosclerotic heart disease of native coronary artery without angina pectoris: Secondary | ICD-10-CM | POA: Diagnosis not present

## 2024-09-16 DIAGNOSIS — N390 Urinary tract infection, site not specified: Secondary | ICD-10-CM

## 2024-09-16 LAB — URINE CULTURE: Culture: >=100000 — AB

## 2024-09-16 LAB — RESPIRATORY PANEL BY PCR

## 2024-09-16 LAB — CK TOTAL AND CKMB (NOT AT ARMC)
CK, MB: 14.6 ng/mL — ABNORMAL HIGH (ref 0.5–5.0)
Total CK: 95 U/L (ref 38–234)

## 2024-09-16 LAB — COOXEMETRY PANEL
Carboxyhemoglobin: 1.6 % — ABNORMAL HIGH (ref 0.5–1.5)
Methemoglobin: <0.7 % (ref 0.0–1.5)
O2 Saturation: 64.3 %
Total hemoglobin: 7.6 g/dL — ABNORMAL LOW (ref 12.0–16.0)

## 2024-09-16 LAB — BASIC METABOLIC PANEL WITH GFR
Anion gap: 16 — ABNORMAL HIGH (ref 5–15)
BUN: 78 mg/dL — ABNORMAL HIGH (ref 8–23)
CO2: 22 mmol/L (ref 22–32)
Calcium: 8.7 mg/dL — ABNORMAL LOW (ref 8.9–10.3)
Chloride: 98 mmol/L (ref 98–111)
Creatinine, Ser: 2.19 mg/dL — ABNORMAL HIGH (ref 0.44–1.00)
GFR, Estimated: 21 mL/min — ABNORMAL LOW
Glucose, Bld: 164 mg/dL — ABNORMAL HIGH (ref 70–99)
Potassium: 4.1 mmol/L (ref 3.5–5.1)
Sodium: 136 mmol/L (ref 135–145)

## 2024-09-16 LAB — CBC
HCT: 24.2 % — ABNORMAL LOW (ref 36.0–46.0)
Hemoglobin: 7.7 g/dL — ABNORMAL LOW (ref 12.0–15.0)
MCH: 28.4 pg (ref 26.0–34.0)
MCHC: 31.8 g/dL (ref 30.0–36.0)
MCV: 89.3 fL (ref 80.0–100.0)
Platelets: 201 K/uL (ref 150–400)
RBC: 2.71 MIL/uL — ABNORMAL LOW (ref 3.87–5.11)
RDW: 14.9 % (ref 11.5–15.5)
WBC: 10.6 K/uL — ABNORMAL HIGH (ref 4.0–10.5)
nRBC: 0 % (ref 0.0–0.2)

## 2024-09-16 LAB — GLUCOSE, CAPILLARY
Glucose-Capillary: 138 mg/dL — ABNORMAL HIGH (ref 70–99)
Glucose-Capillary: 142 mg/dL — ABNORMAL HIGH (ref 70–99)
Glucose-Capillary: 152 mg/dL — ABNORMAL HIGH (ref 70–99)
Glucose-Capillary: 157 mg/dL — ABNORMAL HIGH (ref 70–99)

## 2024-09-16 LAB — STREP PNEUMONIAE URINARY ANTIGEN: Strep Pneumo Urinary Antigen: NEGATIVE

## 2024-09-16 LAB — MRSA NEXT GEN BY PCR, NASAL: MRSA by PCR Next Gen: NOT DETECTED

## 2024-09-16 LAB — ABO/RH: ABO/RH(D): A POS

## 2024-09-16 LAB — MAGNESIUM: Magnesium: 2.6 mg/dL — ABNORMAL HIGH (ref 1.7–2.4)

## 2024-09-16 LAB — CG4 I-STAT (LACTIC ACID): Lactic Acid, Venous: 0.6 mmol/L (ref 0.5–1.9)

## 2024-09-16 LAB — PROCALCITONIN: Procalcitonin: 1.73 ng/mL

## 2024-09-16 LAB — PREPARE RBC (CROSSMATCH)

## 2024-09-16 MED ORDER — DEXMEDETOMIDINE HCL IN NACL 400 MCG/100ML IV SOLN
0.0000 ug/kg/h | INTRAVENOUS | Status: DC
  Administered 2024-09-16: 0.5 ug/kg/h via INTRAVENOUS
  Administered 2024-09-16: 0.4 ug/kg/h via INTRAVENOUS
  Administered 2024-09-17: 0.5 ug/kg/h via INTRAVENOUS
  Filled 2024-09-16: qty 100
  Filled 2024-09-16: qty 200

## 2024-09-16 MED ORDER — OSMOLITE 1.2 CAL PO LIQD
1000.0000 mL | ORAL | Status: DC
  Administered 2024-09-16: 1000 mL
  Filled 2024-09-16 (×2): qty 1000

## 2024-09-16 MED ORDER — FENTANYL CITRATE (PF) 50 MCG/ML IJ SOSY: 25.0000 ug | PREFILLED_SYRINGE | INTRAMUSCULAR | Status: DC | PRN

## 2024-09-16 MED ORDER — FUROSEMIDE 10 MG/ML IJ SOLN
120.0000 mg | Freq: Once | INTRAVENOUS | Status: AC
  Administered 2024-09-16: 120 mg via INTRAVENOUS
  Filled 2024-09-16: qty 10

## 2024-09-16 MED ORDER — HALOPERIDOL LACTATE 5 MG/ML IJ SOLN: 2.0000 mg | Freq: Four times a day (QID) | INTRAMUSCULAR | Status: DC | PRN

## 2024-09-16 MED ORDER — SODIUM CHLORIDE 0.9% IV SOLUTION: Freq: Once | INTRAVENOUS | Status: DC

## 2024-09-16 MED ORDER — ASPIRIN 81 MG PO CHEW
81.0000 mg | CHEWABLE_TABLET | Freq: Every day | ORAL | Status: DC
  Administered 2024-09-17 – 2024-09-19 (×3): 81 mg via ORAL
  Filled 2024-09-16 (×3): qty 1

## 2024-09-16 MED ORDER — CLOPIDOGREL BISULFATE 75 MG PO TABS
75.0000 mg | ORAL_TABLET | Freq: Every day | ORAL | Status: DC
  Administered 2024-09-17 – 2024-09-30 (×14): 75 mg via ORAL
  Filled 2024-09-16 (×7): qty 1

## 2024-09-16 MED ORDER — POLYETHYLENE GLYCOL 3350 17 G PO PACK
17.0000 g | PACK | Freq: Every day | ORAL | Status: DC
  Administered 2024-09-17: 17 g via ORAL
  Filled 2024-09-16: qty 1

## 2024-09-16 MED FILL — Verapamil HCl IV Soln 2.5 MG/ML: INTRAVENOUS | Qty: 2 | Status: AC

## 2024-09-16 NOTE — Progress Notes (Addendum)
 "  NAME:  Alyssa Hudson, MRN:  [id number], DOB:  1940-09-29, LOS: 2 ADMISSION DATE:  09/14/2024, CONSULTATION DATE:  09/15/24 REFERRING MD:  BERNIS, CHIEF COMPLAINT:  Resp distress    History of Present Illness:  Nikkole Placzek is a 83 year old female w/ hx of afib, aortic stenosis presenting with 2 months of worsening SOB.  Got worse a week ago accompanied by anorexia and then went to ER.  Found to have RLL infiltrate, +SIRS criteria and mild trop leak, admitted to FMTS.  Unfortunately developed worsening SOB this am and new EKG changes.  Cardiology evaluating for cath lab but resp status tenuous.  Her biggest complaint besides SOB is anxiety.  She feels better with BIPAP in place.  Pertinent  Medical History   Past Medical History:  Diagnosis Date   Acute diastolic heart failure (HCC) 06/30/2020   Acute pericarditis 07/24/2020   Aftercare following surgery 06/09/2016   Aortic valve sclerosis 2021   Moderate per echo in 2021   Atrial fibrillation (HCC) 07/24/2020   BMI 33.0-33.9,adult    Carpal tunnel syndrome, right 08/13/2020   Cervical radiculopathy 08/13/2020   Cervical radiculopathy due to osteoarthritis of spine    Diverticulosis    Encephalopathy 06/19/2020   Facial droop    Generalized osteoarthritis of multiple sites    Hypertension    Hyponatremia 06/20/2020   Primary hypertension 07/24/2020   Tricompartment osteoarthritis of right knee    Ulnar neuropathy at elbow, right 08/13/2020   Viral pericarditis with pericardial effusion 06/30/2020     Significant Hospital Events: Including procedures, antibiotic start and stop dates in addition to other pertinent events   12/20 admit w/ SOB and fall three d prior  12/21 left heart cath 3V disease. DES to LAD and prox circ. Shock post heart cath. Lactate elevated 12/22 off pressors. Getting blood failed SBT.   Interim History / Subjective:  No distress Objective    Blood pressure (!) 161/54, pulse (!) 54, temperature  98.5 F (36.9 C), temperature source Oral, resp. rate 18, height 5' 6 (1.676 m), weight 71.3 kg, SpO2 99%. CVP:  [2 mmHg-10 mmHg] 8 mmHg  Vent Mode: PRVC FiO2 (%):  [40 %-100 %] 40 % Set Rate:  [26 bmp] 26 bmp Vt Set:  [470 mL] 470 mL PEEP:  [5 cmH20] 5 cmH20 Pressure Support:  [5 cmH20] 5 cmH20 Plateau Pressure:  [17 cmH20-21 cmH20] 21 cmH20   Intake/Output Summary (Last 24 hours) at 09/16/2024 0718 Last data filed at 09/16/2024 0600 Gross per 24 hour  Intake 1321.92 ml  Output 850 ml  Net 471.92 ml   Filed Weights   09/15/24 0500 09/15/24 0548 09/16/24 0500  Weight: 70.6 kg 70.6 kg 71.3 kg    Examination: General 83 year old female patient lying in bed no acute distress HEENT normocephalic atraumatic orally intubated Pulmonary: Decreased bilateral bases, did develop accessory use during spontaneous breathing trial.  Minute ventilation and F/VT were acceptable for extubation initially however developed marked accessory use after minimal exertion.  Portable chest x-ray review RLL  infiltrate a little better tubes and lines good position  Cardiac regular rate and rhythm Extremities are warm and dry, the left knee is swollen and edematous with marked ecchymosis over the knee, extending over the proximal lower extremity pulses are palpable distally cap refill intact GU clear yellow Neuro hard of hearing, follows commands, no focal deficits appreciated Resolved problem list   Assessment and Plan   Acute hypoxemic respiratory failure 2/2 PNA c/b NSTEMI -MRSA neg  Plan Send urine strep and Legionella Follow-up sputum culture Trend procalcitonin Discontinue vancomycin , continue Zosyn  and azithromycin  now day #3 Continuing full ventilator support Reassess SBT in a.m. on 12/23 PAD protocol RASS goal -1, change IV fentanyl  drip to Precedex  and change fentanyl  to as needed VAP bundle A.m. chest x-ray  STEMI 2/2 severe 3V CAD c/b acute diastolic HF; Now s/p DES to LAD, prox circ.  Cardiogenic shock resolved. No longer on pressors, co-ox stable  Plan Cont tele  Repeat lactic acid  MAP goal > 65 Post-op asa and plavix  for DES Holding BB given she just came off pressors NE on standbye to ensure MAP > 65 Transfuse today  Lasix  x1   Acute on chronic renal failure.  CKD stage IIIb at baseline Serum creatinine baseline 1.4, up to 2.19 today.  Still appears volume overloaded Plan IV Lasix  x 1 Strict intake output Renal dose medications Serial chemistries  Traumatic bursitis with large hematoma to left knee, no fracture -Hemoglobin dropping further Plan Trend CBC Transfuse as below Checking lactate as well as total CKs.  The knee is quite large and swollen I do worry a little bit about compartment syndrome Lower extremity ultrasounds, pending follow results  Acute blood loss anemia + hemodilution  Hgb 9.2->8.5->7.7; has sig bruising involving the left knee. Still on Antiplatelet therapy but needs to be w/ the DES Plan Transfuse today  Goal > 8 in setting of ACS  Proteus UTI Plan Antibiotics as above  anxiety overlay complicating picture.   Plan Supportive care PAD protocol   Dye allergy- ~30 years ago, got steroids before cath lab    I personally  spent 32 minutes  on this patient which included: review of medical records, nursing notes, progress notes, evaluation, interpretation of lab data and diagnostic studies, taking independent history, performing exam, documenting plan, ordering diagnostics and interventions for the following critical care issues: Circulatory shock, Acute respiratory failure, Acute renal failure with the following interventions which included: titration of ventilatory support, prevention of further deterioration              "

## 2024-09-16 NOTE — Progress Notes (Signed)
 Left lower extremity venous duplex has been completed.  Results can be found in chart review under CV Proc.  09/16/2024 12:00 PM  Malikiah Debarr [facility], RVT.

## 2024-09-16 NOTE — TOC Initial Note (Signed)
 Transition of Care [facility]) - Initial/Assessment Note    Patient Details  Name: Alyssa Hudson MRN: [id number] Date of Birth: 09/20/1941  Transition of Care [facility]) CM/SW Contact:    Justina Delcia Czar, RN Phone Number: [phone] 09/16/2024, 3:48 PM  Clinical Narrative:                  Spoke to pt and son, Alyssa Hudson at bedside. Pt was able to nod her head. Pt was independent pta, lives at home with husband. Son lives next door and son assist with care. Pt has RW and Rollator at home.   Discussed HH and pt/son agreeable. Will send referral once PT/OT evaluation and recommendation.   Chart reviewed for discharge readiness, patient not medically stable for d/c. Inpatient CM/CSW will continue to monitor pt's advancement through interdisciplinary progression rounds.   If new pt transition needs arise, MD please place a TOC consult.   Expected Discharge Plan: Home w Home Health Services Barriers to Discharge: Continued Medical Work up   Patient Goals and CMS Choice Patient states their goals for this hospitalization and ongoing recovery are:: recover CMS Medicare.gov Compare Post Acute Care list provided to:: Patient Represenative (must comment) Choice offered to / list presented to : Adult Children      Expected Discharge Plan and Services   Discharge Planning Services: CM Consult Post Acute Care Choice: Home Health Living arrangements for the past 2 months: Single Family Home                                      Prior Living Arrangements/Services Living arrangements for the past 2 months: Single Family Home Lives with:: Spouse Patient language and need for interpreter reviewed:: Yes Do you feel safe going back to the place where you live?: Yes      Need for Family Participation in Patient Care: Yes (Comment) Care giver support system in place?: Yes (comment) Current home services: DME (rolling walker, rollator) Criminal Activity/Legal Involvement Pertinent to Current  Situation/Hospitalization: No - Comment as needed  Activities of Daily Living      Permission Sought/Granted Permission sought to share information with : Case Manager, Family Supports, PCP Permission granted to share information with : Yes, Verbal Permission Granted  Share Information with NAME: Alyssa Hudson  Permission granted to share info w AGENCY: Home Health, DME, PCP  Permission granted to share info w Relationship: son, husband  Permission granted to share info w Contact Information: [phone]  Emotional Assessment Appearance:: Appears stated age Attitude/Demeanor/Rapport: Intubated (Following Commands or Not Following Commands) (following commands)          Admission diagnosis:  Respiratory distress [R06.03] Acute respiratory failure with hypoxia (HCC) [J96.01] Patient Active Problem List   Diagnosis Date Noted   CHF exacerbation (HCC) 09/15/2024   Non-ST elevation (NSTEMI) myocardial infarction (HCC) 09/15/2024   Respiratory distress 09/14/2024   Chronic health problem 09/14/2024   Sepsis (HCC) 09/14/2024   Chest pain 09/14/2024   Pneumonia 09/14/2024   AKI (acute kidney injury) 09/14/2024   BMI 33.0-33.9,adult 05/21/2021   Cervical radiculopathy due to osteoarthritis of spine 05/21/2021   Diverticulosis 05/21/2021   Generalized osteoarthritis of multiple sites 05/21/2021   Tricompartment osteoarthritis of right knee 05/21/2021   Hypertension    Facial droop    Carpal tunnel syndrome, right 08/13/2020   Cervical radiculopathy 08/13/2020   Ulnar neuropathy at elbow, right 08/13/2020  Atrial fibrillation (HCC) 07/24/2020   Acute pericarditis 07/24/2020   Primary hypertension 07/24/2020   Viral pericarditis with pericardial effusion 06/30/2020   Acute diastolic heart failure (HCC) 06/30/2020   Hyponatremia 06/20/2020   Encephalopathy 06/19/2020   Aortic valve sclerosis 2021   Aftercare following surgery 06/09/2016   PCP:  [name],  MD Pharmacy:   [address] [place], [place] - [address] [place] [address] Phone: [phone] Fax: [phone]  [facility] - [place], [place] - [address] [place] [address] Phone: [phone] Fax: [phone]     Social Drivers of Health (SDOH) Social History: SDOH Screenings   Food Insecurity: No Food Insecurity (09/15/2024)  Housing: Low Risk (09/15/2024)  Transportation Needs: No Transportation Needs (09/15/2024)  Utilities: Not At Risk (09/15/2024)  Social Connections: Socially Integrated (09/15/2024)  Tobacco Use: Low Risk (07/31/2024)   SDOH Interventions:     Readmission Risk Interventions     No data to display

## 2024-09-16 NOTE — Progress Notes (Addendum)
 "    Advanced Heart Failure Rounding Note  Cardiologist: Dub Huntsman, DO   Patient Profile   Alyssa Hudson is a 83 y.o. female with history of HTN, hx pericarditis in 2021, SVT, CKD. Presented with worsening dyspnea, hypoxia and troponin elevation  Significant events:  12/20: presented with shortness of breath and fall 12/21: Acute respiratory failure and NSTEMI w/ worsening ST changes on ECG. Abx d/t concern for PNA. Urgent LHC - s/p PCI/DES p LAD, PCI/placement 2 overlapping DES p LCX. LVEDP 21. Intubated post cath. Lactic acid 3.   Subjective:    Awake on vent and following commands. Remains on vanc + zosyn  for PNA.  Co-ox 64%. On 2 NE overnight, now weaned off.  CVP 8.   Venous duplex for LLE edema with no evidence of DVT.   Objective:   Weight Range: 71.3 kg Body mass index is 25.37 kg/m.   Vital Signs:   Temp:  [98 F (36.7 C)-99 F (37.2 C)] 99 F (37.2 C) (12/22 0744) Pulse Rate:  [0-109] 54 (12/22 0810) Resp:  [12-50] 26 (12/22 0810) BP: (86-164)/(34-84) 161/54 (12/22 0630) SpO2:  [87 %-100 %] 98 % (12/22 0810) FiO2 (%):  [40 %-100 %] 40 % (12/22 0810) Weight:  [71.3 kg] 71.3 kg (12/22 0500) Last BM Date : 09/14/24  Weight change: Filed Weights   09/15/24 0500 09/15/24 0548 09/16/24 0500  Weight: 70.6 kg 70.6 kg 71.3 kg    Intake/Output:   Intake/Output Summary (Last 24 hours) at 09/16/2024 0850 Last data filed at 09/16/2024 0600 Gross per 24 hour  Intake 1321.92 ml  Output 850 ml  Net 471.92 ml     Physical Exam   General:  Elderly female. Awake on vent.  Cor: Regular rate & rhythm. No murmurs. JVD 8-10 cm.  Lungs: clear Extremities: no edema   Telemetry   SR 60s, PVCs  Labs   CBC Recent Labs    09/14/24 1025 09/15/24 0510 09/15/24 1555 09/15/24 1857 09/16/24 0415  WBC 23.5*   < > 17.0*  --  10.6*  NEUTROABS 16.1*  --   --   --   --   HGB 9.2*   < > 8.5* 8.2* 7.7*  HCT 29.3*   < > 26.7* 24.0* 24.2*  MCV 90.7   < > 89.3   --  89.3  PLT 193   < > 237  --  201   < > = values in this interval not displayed.   Basic Metabolic Panel Recent Labs    87/78/74 0510 09/15/24 1555 09/15/24 1857 09/16/24 0415  NA 136  --  133* 136  K 4.4  --  4.0 4.1  CL 98  --   --  98  CO2 19*  --   --  22  GLUCOSE 114*  --   --  164*  BUN 61*  --   --  78*  CREATININE 1.89* 1.95*  --  2.19*  CALCIUM  8.7*  --   --  8.7*  MG 2.6*  --   --  2.6*   Liver Function Tests Recent Labs    09/14/24 1004  AST 24  ALT 10  ALKPHOS 85  BILITOT 0.8  PROT 6.6  ALBUMIN 3.8   No results for input(s): LIPASE, AMYLASE in the last 72 hours. Cardiac Enzymes No results for input(s): CKTOTAL, CKMB, CKMBINDEX, TROPONINI in the last 72 hours.  BNP: BNP (last 3 results) No results for input(s): BNP in the last 8760  hours.  ProBNP (last 3 results) Recent Labs    09/14/24 1004  PROBNP 18,334.0*     D-Dimer No results for input(s): DDIMER in the last 72 hours. Hemoglobin A1C No results for input(s): HGBA1C in the last 72 hours. Fasting Lipid Panel No results for input(s): CHOL, HDL, LDLCALC, TRIG, CHOLHDL, LDLDIRECT in the last 72 hours. Medications:   Scheduled Medications:  aspirin   81 mg Per Tube Daily   Chlorhexidine  Gluconate Cloth  6 each Topical Daily   clopidogrel   75 mg Per Tube Q breakfast   heparin   5,000 Units Subcutaneous Q8H   mouth rinse  15 mL Mouth Rinse Q2H   pantoprazole  (PROTONIX ) IV  40 mg Intravenous QHS   polyethylene glycol  17 g Per Tube Daily   sodium chloride  flush  3 mL Intravenous Q12H    Infusions:  sodium chloride  10 mL/hr at 09/16/24 0600   azithromycin  Stopped (09/15/24 1119)   fentaNYL  infusion INTRAVENOUS 75 mcg/hr (09/16/24 0600)   norepinephrine  (LEVOPHED ) Adult infusion Stopped (09/16/24 0416)   piperacillin -tazobactam (ZOSYN )  IV 2.25 g (09/16/24 0701)   vancomycin  Stopped (09/15/24 1901)    PRN Medications: sodium chloride , acetaminophen ,  fentaNYL , ipratropium-albuterol , midazolam  PF, mouth rinse, sodium chloride  flush  Assessment/Plan   Acute hypoxic respiratory failure RLL PNA - Abx per CCM. Stopped Vancomycin , now on Zosyn  and Azithromycin  - PCT pending - Failed SBT  2. NSTEMI CAD -s/p PCI/DES p LAD, PCI/placement 2 overlapping DES p LCX -has residual RCA and diagonal disease which is being treated medically -DAPT with aspirin  + plavix  -statin  3. Shock -? Mixed cardiogenic and septic -Lactic acid up to 3, recheck today pending -Now off NE -Echo with EF 55-60%, grade III DD, RV okay, mild to moderate MR, dilated IVC -Volume up. Give 120 mg lasix  IV today.  4. CKD - Scr 1.5 several years ago -1.9 on admission, 2.2 today - Unclear baseline  5. UTI - Urine culture grew >100,000 colonies proteus mirabilis - Abx per CCM  6. LLE swelling/ecchymosis -No DVT on US   7. Anemia - Hgb 9.2>>7.7 - Give 1 u RBCs today  Discussed with CCM at bedside  Length of Stay: 2  FINCH, LINDSAY N, PA-C  09/16/2024, 8:50 AM  Advanced Heart Failure Team Pager [phone] (M-F; 7a - 5p)   Please visit [email or web address]: For overnight coverage please call cardiology fellow first. If fellow not available call Shock/ECMO MD on call.  For ECMO / Mechanical Support (Impella, IABP, LVAD) issues call Shock / ECMO MD on call.     Agree with above.   S/p PCI LAD and LCx yesterday. Remains intubated. Has failed vent wean.   General:  intubated chronically ill appearing HEENT: normal + ETT Neck: supple. JVP to jaw  Cor: Regular rate & rhythm.  Lungs: course  Abdomen: soft, nontender, nondistended.Good bowel sounds. Extremities: no cyanosis, clubbing, rash, tr edema  large ecchymosis LLE  Neuro: sedated on vent   She is not ready for extubation yet. Will need IV diuresis. Continue DAPT. Transfuse 1u RBCs.  D/w CCM at bedside.   CRITICAL CARE Performed by: Cherrie Sieving  Total critical care time: 37 minutes  Critical  care time was exclusive of separately billable procedures and treating other patients.  Critical care was necessary to treat or prevent imminent or life-threatening deterioration.  Critical care was time spent personally by me (independent of midlevel providers or residents) on the following activities: development of treatment plan with patient and/or surrogate as well as  nursing, discussions with consultants, evaluation of patient's response to treatment, examination of patient, obtaining history from patient or surrogate, ordering and performing treatments and interventions, ordering and review of laboratory studies, ordering and review of radiographic studies, pulse oximetry and re-evaluation of patient's condition.  Toribio Fuel, MD  6:04 PM   "

## 2024-09-16 NOTE — Progress Notes (Signed)
 Lactic acid cleared tCK reassuring  Maude FORBES Banner ACNP-BC [facility] Pulmonary/Critical Care Pager # [phone] OR # [phone] if no answer

## 2024-09-17 ENCOUNTER — Inpatient Hospital Stay (HOSPITAL_COMMUNITY)

## 2024-09-17 ENCOUNTER — Other Ambulatory Visit (HOSPITAL_COMMUNITY): Payer: Self-pay

## 2024-09-17 ENCOUNTER — Telehealth (HOSPITAL_COMMUNITY): Payer: Self-pay | Admitting: Pharmacy Technician

## 2024-09-17 DIAGNOSIS — J9601 Acute respiratory failure with hypoxia: Secondary | ICD-10-CM | POA: Diagnosis not present

## 2024-09-17 DIAGNOSIS — I4891 Unspecified atrial fibrillation: Secondary | ICD-10-CM

## 2024-09-17 DIAGNOSIS — I130 Hypertensive heart and chronic kidney disease with heart failure and stage 1 through stage 4 chronic kidney disease, or unspecified chronic kidney disease: Secondary | ICD-10-CM

## 2024-09-17 DIAGNOSIS — R579 Shock, unspecified: Secondary | ICD-10-CM | POA: Diagnosis not present

## 2024-09-17 DIAGNOSIS — B964 Proteus (mirabilis) (morganii) as the cause of diseases classified elsewhere: Secondary | ICD-10-CM

## 2024-09-17 DIAGNOSIS — R570 Cardiogenic shock: Secondary | ICD-10-CM

## 2024-09-17 DIAGNOSIS — J189 Pneumonia, unspecified organism: Secondary | ICD-10-CM

## 2024-09-17 DIAGNOSIS — I2510 Atherosclerotic heart disease of native coronary artery without angina pectoris: Secondary | ICD-10-CM | POA: Diagnosis not present

## 2024-09-17 DIAGNOSIS — I214 Non-ST elevation (NSTEMI) myocardial infarction: Secondary | ICD-10-CM | POA: Diagnosis not present

## 2024-09-17 DIAGNOSIS — E8721 Acute metabolic acidosis: Secondary | ICD-10-CM

## 2024-09-17 LAB — COMPREHENSIVE METABOLIC PANEL WITH GFR
ALT: 15 U/L (ref 0–44)
AST: 17 U/L (ref 15–41)
Albumin: 3.1 g/dL — ABNORMAL LOW (ref 3.5–5.0)
Alkaline Phosphatase: 61 U/L (ref 38–126)
Anion gap: 15 (ref 5–15)
BUN: 102 mg/dL — ABNORMAL HIGH (ref 8–23)
CO2: 24 mmol/L (ref 22–32)
Calcium: 8.4 mg/dL — ABNORMAL LOW (ref 8.9–10.3)
Chloride: 100 mmol/L (ref 98–111)
Creatinine, Ser: 2.56 mg/dL — ABNORMAL HIGH (ref 0.44–1.00)
GFR, Estimated: 18 mL/min — ABNORMAL LOW
Glucose, Bld: 139 mg/dL — ABNORMAL HIGH (ref 70–99)
Potassium: 4 mmol/L (ref 3.5–5.1)
Sodium: 138 mmol/L (ref 135–145)
Total Bilirubin: 0.3 mg/dL (ref 0.0–1.2)
Total Protein: 5.4 g/dL — ABNORMAL LOW (ref 6.5–8.1)

## 2024-09-17 LAB — CULTURE, RESPIRATORY W GRAM STAIN
Culture: NO GROWTH
Gram Stain: NONE SEEN

## 2024-09-17 LAB — TYPE AND SCREEN
ABO/RH(D): A POS
Antibody Screen: NEGATIVE
Unit division: 0

## 2024-09-17 LAB — PROCALCITONIN: Procalcitonin: 1.5 ng/mL

## 2024-09-17 LAB — BPAM RBC
Blood Product Expiration Date: 202601162359
ISSUE DATE / TIME: 202512221401
Unit Type and Rh: 6200

## 2024-09-17 LAB — MYOGLOBIN, URINE: Myoglobin, Ur: 9 ng/mL (ref 0–13)

## 2024-09-17 LAB — CBC
HCT: 24.4 % — ABNORMAL LOW (ref 36.0–46.0)
Hemoglobin: 8 g/dL — ABNORMAL LOW (ref 12.0–15.0)
MCH: 28.9 pg (ref 26.0–34.0)
MCHC: 32.8 g/dL (ref 30.0–36.0)
MCV: 88.1 fL (ref 80.0–100.0)
Platelets: 159 K/uL (ref 150–400)
RBC: 2.77 MIL/uL — ABNORMAL LOW (ref 3.87–5.11)
RDW: 15.1 % (ref 11.5–15.5)
WBC: 9.8 K/uL (ref 4.0–10.5)
nRBC: 0.2 % (ref 0.0–0.2)

## 2024-09-17 LAB — CK TOTAL AND CKMB (NOT AT ARMC)
CK, MB: 5.6 ng/mL — ABNORMAL HIGH (ref 0.5–5.0)
Total CK: 42 U/L (ref 38–234)

## 2024-09-17 LAB — GLUCOSE, CAPILLARY
Glucose-Capillary: 149 mg/dL — ABNORMAL HIGH (ref 70–99)
Glucose-Capillary: 147 mg/dL — ABNORMAL HIGH (ref 70–99)
Glucose-Capillary: 107 mg/dL — ABNORMAL HIGH (ref 70–99)
Glucose-Capillary: 122 mg/dL — ABNORMAL HIGH (ref 70–99)
Glucose-Capillary: 113 mg/dL — ABNORMAL HIGH (ref 70–99)
Glucose-Capillary: 112 mg/dL — ABNORMAL HIGH (ref 70–99)

## 2024-09-17 MED ORDER — APIXABAN 2.5 MG PO TABS
2.5000 mg | ORAL_TABLET | Freq: Two times a day (BID) | ORAL | Status: DC
  Administered 2024-09-17 – 2024-09-30 (×26): 2.5 mg via ORAL
  Filled 2024-09-17 (×12): qty 1

## 2024-09-17 MED ORDER — ATORVASTATIN CALCIUM 40 MG PO TABS
40.0000 mg | ORAL_TABLET | Freq: Every day | ORAL | Status: DC
  Administered 2024-09-18 – 2024-09-30 (×13): 40 mg via ORAL
  Filled 2024-09-17 (×6): qty 1

## 2024-09-17 MED ORDER — ENSURE PLUS HIGH PROTEIN PO LIQD
237.0000 mL | Freq: Two times a day (BID) | ORAL | Status: DC
  Administered 2024-09-17 – 2024-09-30 (×19): 237 mL via ORAL

## 2024-09-17 NOTE — Progress Notes (Signed)
 "  NAME:  Alyssa Hudson, MRN:  [id number], DOB:  11-02-1940, LOS: 3 ADMISSION DATE:  09/14/2024, CONSULTATION DATE:  09/15/24 REFERRING MD:  BERNIS, CHIEF COMPLAINT:  Resp distress    History of Present Illness:  Francys Bolin is a 83 year old female w/ hx of afib, aortic stenosis presenting with 2 months of worsening SOB.  Got worse a week ago accompanied by anorexia and then went to ER.  Found to have RLL infiltrate, +SIRS criteria and mild trop leak, admitted to FMTS.  Unfortunately developed worsening SOB this am and new EKG changes.  Cardiology evaluating for cath lab but resp status tenuous.  Her biggest complaint besides SOB is anxiety.  She feels better with BIPAP in place.  Pertinent  Medical History   Past Medical History:  Diagnosis Date   Acute diastolic heart failure (HCC) 06/30/2020   Acute pericarditis 07/24/2020   Aftercare following surgery 06/09/2016   Aortic valve sclerosis 2021   Moderate per echo in 2021   Atrial fibrillation (HCC) 07/24/2020   BMI 33.0-33.9,adult    Carpal tunnel syndrome, right 08/13/2020   Cervical radiculopathy 08/13/2020   Cervical radiculopathy due to osteoarthritis of spine    Diverticulosis    Encephalopathy 06/19/2020   Facial droop    Generalized osteoarthritis of multiple sites    Hypertension    Hyponatremia 06/20/2020   Primary hypertension 07/24/2020   Tricompartment osteoarthritis of right knee    Ulnar neuropathy at elbow, right 08/13/2020   Viral pericarditis with pericardial effusion 06/30/2020     Significant Hospital Events: Including procedures, antibiotic start and stop dates in addition to other pertinent events   12/20 admit w/ SOB and fall three d prior  12/21 left heart cath 3V disease. DES to LAD and prox circ. Shock post heart cath. Lactate elevated 12/22 off pressors. Getting blood failed SBT.  12/23 Passed SBT, following commands, CVP 5-7, extubate   Interim History / Subjective:  No distress  Following  commands on vent, weaning will extubate     Objective    Blood pressure (!) 115/47, pulse 62, temperature 97.9 F (36.6 C), temperature source Oral, resp. rate (!) 23, height 5' 6 (1.676 m), weight 74 kg, SpO2 99%. CVP:  [3 mmHg-13 mmHg] 4 mmHg  Vent Mode: PSV;CPAP FiO2 (%):  [40 %] 40 % Set Rate:  [26 bmp] 26 bmp Vt Set:  [470 mL] 470 mL PEEP:  [5 cmH20] 5 cmH20 Pressure Support:  [5 cmH20] 5 cmH20 Plateau Pressure:  [20 cmH20-23 cmH20] 20 cmH20   Intake/Output Summary (Last 24 hours) at 09/17/2024 1528 Last data filed at 09/17/2024 1438 Gross per 24 hour  Intake 1293.64 ml  Output 700 ml  Net 593.64 ml   Filed Weights   09/15/24 0548 09/16/24 0500 09/17/24 0500  Weight: 70.6 kg 71.3 kg 74 kg    Examination: General: acute on chronic elderly female on vent, following commands in NAD HEENT: Normocephalic, PERRLA intact, ETT, OG, Pink MM, missing teeth, left facial droop- previous hx of  CV: s1,s2, RRR, no MRG, No JVD  pulm: clear, diminished, no distress on vent  Abs: bs active, soft  Extremities: 1 + pitting edema lower extremities, left lower leg ecchymosis improving, moves all extremities on command Skin: no rash  Neuro: Rass 0, follows commands, cough gag reflex present  GU: purewick   Resolved problem list   Assessment and Plan   Acute hypoxemic respiratory failure 2/2 PNA c/b NSTEMI Currently weaning, Extubation orders placed  -MRSA neg  Strep pna neg  Legionella pending P: Continue zosyn  and Azithromycin   Extubate to [place], PRN BIPAP if needed  Wean off precedex  gtt  Up to chair later today if tolerates extubation  HF team following, planning to hold further diuresis for today, appreciate assistance  Continue to follow cultures   NSTEMI 2/2 severe 3V CAD c/b acute diastolic HF; Now s/p DES to LAD, prox circ. Cardiogenic shock resolved. No longer on pressors, co-ox stable  Lactic Acidosis cleared  P: Continue cardiac tele  Continue aspirin  and plavix    Off pressors  Hold off further diuresis   Paroxysmal Afib Overnight Currently in NSR P: HF starting eliquis  for AC   Acute on chronic renal failure.  CKD stage IIIb at baseline Serum creatinine baseline 1.4 2.19>2.56  P: Continue to trend renal function daily  Continue to monitor and optimize electrolytes daily Continue to monitor urine output Continue strict I/Os Continue Adequate renal perfusion  Avoid nephrotoxic agents  Reassess daily need for diuresis   Acute blood loss anemia + hemodilution  Traumatic bursitis with large hematoma to left knee, no fracture -Hemoglobin dropping further Hgb now 8.0 from 1 unit transfusion of PRBC, from 7.7  DVT neg for PE  P: Bruising appears to be improving, no signs of compartment syndrome  Hgb improved slightly, transfuse for hgb  < 7  Continue DAPT  Monitor for signs of bleeding Fall precautions   Proteus UTI P: Continue ABX as above   Dye allergy- ~30 years ago, got steroids before cath lab    I personally  spent 45 minutes  on this patient which included: review of medical records, nursing notes, progress notes, evaluation, interpretation of lab data and diagnostic studies, taking independent history, performing exam, documenting plan, ordering diagnostics and interventions for the following critical care issues: Circulatory shock, Acute respiratory failure, Acute renal failure with the following interventions which included: titration of ventilatory support, prevention of further deterioration      Sherlean Sharps AGACNP-BC   [facility] 09/17/2024, 3:57 PM  Please see [email or web address] for pager details.  From 7A-7P if no response, please call [phone]. After hours, please call ELink [phone].         "

## 2024-09-17 NOTE — Evaluation (Signed)
 Occupational Therapy Evaluation Patient Details Name: Alyssa Hudson MRN: [id number] DOB: 11-29-1940 Today's Date: 09/17/2024   History of Present Illness   Pt is an 83 y/o F presenting to ED on 12/20 with SOB, fall with LLE hematoma. CXR with bil PNA. Pt with RR called 12/21 for tachypnea on bipap, intubated 12/21 and transferred to ICU. Admitted for acute hypoxemic respiratory failure 2/2 PNA. Extubated 12/23. PMH includes HTN, A fib, cervical radiculopathy.     Clinical Impressions Pt reports using rollator and cane at baseline for mobility, assist for LB ADLs since the fall. Pt lives with spouse but son also lives close by and can assist at d/c. Pt currently needs min-mod A for ADLs, min A for transfers with RW. VSS on RA throughout, but pt reports 10/10 fatigue after transferring to [facility] and ambulating to sink and back to chair in room. Pt presenting with impairments listed below, will follow acutely. Recommend HHOT at d/c.      If plan is discharge home, recommend the following:   A little help with walking and/or transfers;A little help with bathing/dressing/bathroom;Assistance with cooking/housework;Assist for transportation;Help with stairs or ramp for entrance     Functional Status Assessment   Patient has had a recent decline in their functional status and demonstrates the ability to make significant improvements in function in a reasonable and predictable amount of time.     Equipment Recommendations   None recommended by OT     Recommendations for Other Services   PT consult     Precautions/Restrictions   Precautions Precautions: Fall Restrictions Weight Bearing Restrictions Per Provider Order: No     Mobility Bed Mobility               General bed mobility comments: OOB in chair upon arrival and departure    Transfers Overall transfer level: Needs assistance Equipment used: Rolling walker (2 wheels) Transfers: Sit to/from Stand Sit to  Stand: Min assist                  Balance Overall balance assessment: Needs assistance Sitting-balance support: Feet supported Sitting balance-Leahy Scale: Good     Standing balance support: During functional activity, Reliant on assistive device for balance Standing balance-Leahy Scale: Fair                             ADL either performed or assessed with clinical judgement   ADL Overall ADL's : Needs assistance/impaired Eating/Feeding: Set up   Grooming: Set up   Upper Body Bathing: Minimal assistance   Lower Body Bathing: Moderate assistance   Upper Body Dressing : Minimal assistance   Lower Body Dressing: Moderate assistance   Toilet Transfer: Minimal assistance;Ambulation;Rolling walker (2 wheels);BSC/3in1   Toileting- Clothing Manipulation and Hygiene: Contact guard assist       Functional mobility during ADLs: Contact guard assist;Rolling walker (2 wheels)       Vision Baseline Vision/History: 1 Wears glasses Vision Assessment?: No apparent visual deficits Additional Comments: glasses not present, no change in vision per pt     Perception         Praxis         Pertinent Vitals/Pain Pain Assessment Pain Assessment: No/denies pain     Extremity/Trunk Assessment Upper Extremity Assessment Upper Extremity Assessment: Generalized weakness;RUE deficits/detail RUE Deficits / Details: hx of proximal humerus fx, now without restrictions per pt, 75% ROM compared to L UE RUE Coordination: decreased gross  motor   Lower Extremity Assessment Lower Extremity Assessment: Generalized weakness   Cervical / Trunk Assessment Cervical / Trunk Assessment: Normal   Communication Communication Communication: Impaired Factors Affecting Communication: Hearing impaired   Cognition Arousal: Alert Behavior During Therapy: WFL for tasks assessed/performed Cognition: No apparent impairments                               Following  commands: Intact       Cueing  General Comments   Cueing Techniques: Verbal cues  VSS on RA   Exercises     Shoulder Instructions      Home Living Family/patient expects to be discharged to:: Private residence Living Arrangements: Spouse/significant other Available Help at Discharge: Family;Available PRN/intermittently Type of Home: House Home Access: Stairs to enter [employer] of Steps: 4 Entrance Stairs-Rails: Can reach both Home Layout: One level     Bathroom Shower/Tub: Tub/shower unit;Walk-in shower   Bathroom Toilet: Handicapped height     Home Equipment: Rollator (4 wheels);Shower seat;BSC/3in1;Cane - single point          Prior Functioning/Environment Prior Level of Function : Needs assist             Mobility Comments: rollator and cane ADLs Comments: has had assist for LB ADLs since prior falls    OT Problem List: Decreased strength;Decreased range of motion;Decreased activity tolerance;Impaired balance (sitting and/or standing);Decreased coordination;Cardiopulmonary status limiting activity   OT Treatment/Interventions: Self-care/ADL training;Therapeutic exercise;Energy conservation;DME and/or AE instruction;Therapeutic activities;Patient/family education;Balance training      OT Goals(Current goals can be found in the care plan section)   Acute Rehab OT Goals Patient Stated Goal: did not state OT Goal Formulation: With patient Time For Goal Achievement: 10/01/24 Potential to Achieve Goals: Good ADL Goals Pt Will Perform Lower Body Dressing: with supervision;sitting/lateral leans;sit to/from stand;with adaptive equipment Pt Will Transfer to Toilet: with supervision;ambulating;regular height toilet Additional ADL Goal #1: pt will tolerate OOB standing activity x10 min in order to improve activity tolerance for ADLs   OT Frequency:  Min 2X/week    Co-evaluation              AM-PAC OT 6 Clicks Daily Activity      Outcome Measure Help from another person eating meals?: None Help from another person taking care of personal grooming?: A Little Help from another person toileting, which includes using toliet, bedpan, or urinal?: A Little Help from another person bathing (including washing, rinsing, drying)?: A Lot Help from another person to put on and taking off regular upper body clothing?: A Little Help from another person to put on and taking off regular lower body clothing?: A Lot 6 Click Score: 17   End of Session Equipment Utilized During Treatment: Gait belt;Rolling walker (2 wheels) Nurse Communication: Mobility status  Activity Tolerance: Patient tolerated treatment well Patient left: in chair;with call bell/phone within reach;with family/visitor present  OT Visit Diagnosis: Other abnormalities of gait and mobility (R26.89);Unsteadiness on feet (R26.81);Muscle weakness (generalized) (M62.81);History of falling (Z91.81)                Time: 8591-8565 OT Time Calculation (min): 26 min Charges:  OT General Charges $OT Visit: 1 Visit OT Evaluation $OT Eval Moderate Complexity: 1 Mod OT Treatments $Self Care/Home Management : 8-22 mins  Alyssa Hudson, OTD, OTR/L SecureChat Preferred Acute Rehab [phone]   Laneta POUR Koonce 09/17/2024, 2:49 PM

## 2024-09-17 NOTE — Telephone Encounter (Signed)
 Patient Product/process development scientist completed.    The patient is insured through [employer]. Patient has Medicare and is not eligible for a copay card, but may be able to apply for patient assistance or Medicare RX Payment Plan (Patient Must reach out to their plan, if eligible for payment plan), if available.    Ran test claim for Eliquis 5 mg and the current 30 day co-pay is $40.00.   This test claim was processed through [facility]- copay amounts may vary at other pharmacies due to pharmacy/plan contracts, or as the patient moves through the different stages of their insurance plan.     Reyes Sharps, CPHT Pharmacy Technician Patient Advocate Specialist Lead [facility] Patient Advocate Team Direct Number: [phone]  Fax: [phone]

## 2024-09-17 NOTE — Progress Notes (Addendum)
 "    Advanced Heart Failure Rounding Note  Cardiologist: Dub Huntsman, DO   Patient Profile   Alyssa Hudson is a 83 y.o. female with history of HTN, hx pericarditis in 2021, SVT, CKD. Presented with worsening dyspnea, hypoxia and troponin elevation  Significant events:  12/20: presented with shortness of breath and fall 12/21: Acute respiratory failure and NSTEMI w/ worsening ST changes on ECG. Abx d/t concern for PNA. Urgent LHC - s/p PCI/DES p LAD, PCI/placement 2 overlapping DES p LCX. LVEDP 21. Intubated post cath. Lactic acid 3. 12/23 Extubated  Subjective:    Extubated this am  Scr higher at 2.6. Marginal UOP last 24 hrs. CVP 12.   Looks like she had Afib/flutter overnight with controlled rate. Currently SR.  Objective:   Weight Range: 74 kg Body mass index is 26.33 kg/m.   Vital Signs:   Temp:  [97.8 F (36.6 C)-98.2 F (36.8 C)] 97.9 F (36.6 C) (12/23 1118) Pulse Rate:  [56-98] 72 (12/23 1000) Resp:  [16-39] 30 (12/23 1000) BP: (81-155)/(44-85) 123/56 (12/23 1000) SpO2:  [97 %-100 %] 100 % (12/23 1000) FiO2 (%):  [40 %] 40 % (12/23 0752) Weight:  [74 kg] 74 kg (12/23 0500) Last BM Date : 09/14/24  Weight change: Filed Weights   09/15/24 0548 09/16/24 0500 09/17/24 0500  Weight: 70.6 kg 71.3 kg 74 kg    Intake/Output:   Intake/Output Summary (Last 24 hours) at 09/17/2024 1205 Last data filed at 09/17/2024 1000 Gross per 24 hour  Intake 1482.49 ml  Output 1000 ml  Net 482.49 ml     Physical Exam   General:  Sitting up in bed.  Cor: Regular rate & rhythm. No murmurs. Lungs: comfortable on O2, [place] Abdomen: soft, nontender, nondistended. Extremities: ecchymoses and edema LLE, but soft Neuro: alert & orientedx3. Affect pleasant   Telemetry   Currently SR, Afib overnight with controlled rate  Labs   CBC Recent Labs    09/16/24 0415 09/17/24 0424  WBC 10.6* 9.8  HGB 7.7* 8.0*  HCT 24.2* 24.4*  MCV 89.3 88.1  PLT 201 159   Basic  Metabolic Panel Recent Labs    87/78/74 0510 09/15/24 1555 09/16/24 0415 09/17/24 0424  NA 136   < > 136 138  K 4.4   < > 4.1 4.0  CL 98  --  98 100  CO2 19*  --  22 24  GLUCOSE 114*  --  164* 139*  BUN 61*  --  78* 102*  CREATININE 1.89*   < > 2.19* 2.56*  CALCIUM  8.7*  --  8.7* 8.4*  MG 2.6*  --  2.6*  --    < > = values in this interval not displayed.   Liver Function Tests Recent Labs    09/17/24 0424  AST 17  ALT 15  ALKPHOS 61  BILITOT 0.3  PROT 5.4*  ALBUMIN 3.1*   No results for input(s): LIPASE, AMYLASE in the last 72 hours. Cardiac Enzymes Recent Labs    09/16/24 1406 09/17/24 0424  CKTOTAL 95 42  CKMB 14.6* 5.6*    BNP: BNP (last 3 results) No results for input(s): BNP in the last 8760 hours.  ProBNP (last 3 results) Recent Labs    09/14/24 1004  PROBNP 18,334.0*     D-Dimer No results for input(s): DDIMER in the last 72 hours. Hemoglobin A1C No results for input(s): HGBA1C in the last 72 hours. Fasting Lipid Panel No results for input(s): CHOL, HDL, LDLCALC,  TRIG, CHOLHDL, LDLDIRECT in the last 72 hours. Medications:   Scheduled Medications:  sodium chloride    Intravenous Once   aspirin   81 mg Oral Daily   Chlorhexidine  Gluconate Cloth  6 each Topical Daily   clopidogrel   75 mg Oral Q breakfast   heparin   5,000 Units Subcutaneous Q8H   mouth rinse  15 mL Mouth Rinse Q2H   pantoprazole  (PROTONIX ) IV  40 mg Intravenous QHS   polyethylene glycol  17 g Oral Daily   sodium chloride  flush  3 mL Intravenous Q12H    Infusions:  dexmedetomidine  (PRECEDEX ) IV infusion 0.5 mcg/kg/hr (09/17/24 1000)   norepinephrine  (LEVOPHED ) Adult infusion Stopped (09/16/24 0416)   piperacillin -tazobactam (ZOSYN )  IV Stopped (09/17/24 0600)    PRN Medications: acetaminophen , fentaNYL  (SUBLIMAZE ) injection, haloperidol  lactate, ipratropium-albuterol , mouth rinse, sodium chloride  flush  Assessment/Plan   Acute hypoxic respiratory  failure RLL PNA - Abx per CCM. Stopped Vancomycin , now on Zosyn  and Azithromycin  - Extubated today. Stable on [place]  2. NSTEMI CAD -s/p PCI/DES p LAD, PCI/placement 2 overlapping DES p LCX -has residual RCA and diagonal disease which is being treated medically -DAPT with aspirin  + plavix , will eventually need to stop aspirin  and continue plavix  as single antiplatelet with need for anticoagulation -statin  3. Shock -? Mixed cardiogenic and septic -Lactic acid up to 3, now cleared -Off NE -Echo with EF 55-60%, grade III DD, RV okay, mild to moderate MR, dilated IVC -CVP 12. Didn't respond much to IV lasix  yesterday. Scr up. Hold off on diuresis today  4. CKD - Scr 1.5 several years ago -1.9 on admission, 2.6 today and BUN > 100 - Hold diuresis as above  Suspect ATN +/- CIN  5. UTI - Urine culture grew >100,000 colonies proteus mirabilis - Abx per CCM  6. LLE swelling/ecchymosis -No DVT on US   7. Anemia - Hgb 9.2>>7.7>8 - 1 u RBCs 12/22  8. Atrial fibrillation - Noted on tele overnight - Now back in SR - Start eliquis  2.5 mg BID  Discussed with CCM at bedside  CRITICAL CARE Performed by: COLLETTA MANUELITA SAILOR   Total critical care time: 12 minutes  Critical care time was exclusive of separately billable procedures and treating other patients.  Critical care was necessary to treat or prevent imminent or life-threatening deterioration.  Critical care was time spent personally by me on the following activities: development of treatment plan with patient and/or surrogate as well as nursing, discussions with consultants, evaluation of patient's response to treatment, examination of patient, obtaining history from patient or surrogate, ordering and performing treatments and interventions, ordering and review of laboratory studies, ordering and review of radiographic studies, pulse oximetry and re-evaluation of patient's condition.   Length of Stay: 3  FINCH, LINDSAY N, PA-C   09/17/2024, 12:05 PM  Advanced Heart Failure Team Pager [phone] (M-F; 7a - 5p)   Please visit [email or web address]: For overnight coverage please call cardiology fellow first. If fellow not available call Shock/ECMO MD on call.  For ECMO / Mechanical Support (Impella, IABP, LVAD) issues call Shock / ECMO MD on call.    Agree with above.   Patient extubated this am. NE stopped this am. Lactic acid has cleared CVP 12  Had AF overnight.   Remains weak and SOB  General:  Sitting up in bed.  HEENT: normal Neck: supple.JVP to jaw Cor: Regular rate & rhythm. No rubs, gallops or murmurs. Lungs: decreased at bases  Abdomen: soft, nontender, nondistended.Good bowel sounds. Extremities: no cyanosis,  clubbing, rash, 1+ edema + LLE ecchymoses  Neuro: alert & orientedx3, cranial nerves grossly intact. moves all 4 extremities w/o difficulty. Affect pleasant  Will continue current care for now. Follow CVP and co-ox. Hold diuretics today. Start Eliquis . Continue DAPT with ASA/Plavix  (drop ASA in 30 days). If AF recurs wil start amio.   CRITICAL CARE Performed by: Cherrie Sieving  Total critical care time: 35 minutes  Critical care time was exclusive of separately billable procedures and treating other patients.  Critical care was necessary to treat or prevent imminent or life-threatening deterioration.  Critical care was time spent personally by me (independent of midlevel providers or residents) on the following activities: development of treatment plan with patient and/or surrogate as well as nursing, discussions with consultants, evaluation of patient's response to treatment, examination of patient, obtaining history from patient or surrogate, ordering and performing treatments and interventions, ordering and review of laboratory studies, ordering and review of radiographic studies, pulse oximetry and re-evaluation of patient's condition.  Sieving Cherrie, MD  11:20 PM     "

## 2024-09-17 NOTE — Procedures (Signed)
 Extubation Procedure Note  Patient Details:   Name: Alyssa Hudson DOB: 1941-06-18 MRN: [id number]   Airway Documentation:    Vent end date: 09/17/24 Vent end time: 0903   Evaluation  O2 sats: stable throughout Complications: No apparent complications Patient did tolerate procedure well. Bilateral Breath Sounds: Clear, Diminished   Yes  Pt extubated to 3L [place] per order. Pt tolerated well, cuff leak present, no stridor noted, RN at bedside.   Waddell CHRISTELLA Hint 09/17/2024, 9:04 AM

## 2024-09-18 DIAGNOSIS — J9601 Acute respiratory failure with hypoxia: Secondary | ICD-10-CM | POA: Diagnosis not present

## 2024-09-18 DIAGNOSIS — R579 Shock, unspecified: Secondary | ICD-10-CM | POA: Diagnosis not present

## 2024-09-18 DIAGNOSIS — I214 Non-ST elevation (NSTEMI) myocardial infarction: Secondary | ICD-10-CM | POA: Diagnosis not present

## 2024-09-18 DIAGNOSIS — I480 Paroxysmal atrial fibrillation: Secondary | ICD-10-CM | POA: Diagnosis not present

## 2024-09-18 DIAGNOSIS — I2510 Atherosclerotic heart disease of native coronary artery without angina pectoris: Secondary | ICD-10-CM | POA: Diagnosis not present

## 2024-09-18 DIAGNOSIS — I10 Essential (primary) hypertension: Secondary | ICD-10-CM | POA: Diagnosis not present

## 2024-09-18 DIAGNOSIS — I5031 Acute diastolic (congestive) heart failure: Secondary | ICD-10-CM | POA: Diagnosis not present

## 2024-09-18 DIAGNOSIS — N189 Chronic kidney disease, unspecified: Secondary | ICD-10-CM | POA: Diagnosis not present

## 2024-09-18 DIAGNOSIS — I4891 Unspecified atrial fibrillation: Secondary | ICD-10-CM | POA: Diagnosis not present

## 2024-09-18 LAB — LIPID PANEL
Cholesterol: 128 mg/dL (ref 0–200)
HDL: 36 mg/dL — ABNORMAL LOW
LDL Cholesterol: 51 mg/dL (ref 0–99)
Total CHOL/HDL Ratio: 3.6 ratio
Triglycerides: 204 mg/dL — ABNORMAL HIGH
VLDL: 41 mg/dL — ABNORMAL HIGH (ref 0–40)

## 2024-09-18 LAB — BASIC METABOLIC PANEL WITH GFR
Anion gap: 12 (ref 5–15)
BUN: 80 mg/dL — ABNORMAL HIGH (ref 8–23)
CO2: 25 mmol/L (ref 22–32)
Calcium: 8.5 mg/dL — ABNORMAL LOW (ref 8.9–10.3)
Chloride: 103 mmol/L (ref 98–111)
Creatinine, Ser: 1.76 mg/dL — ABNORMAL HIGH (ref 0.44–1.00)
GFR, Estimated: 28 mL/min — ABNORMAL LOW
Glucose, Bld: 112 mg/dL — ABNORMAL HIGH (ref 70–99)
Potassium: 3.3 mmol/L — ABNORMAL LOW (ref 3.5–5.1)
Sodium: 139 mmol/L (ref 135–145)

## 2024-09-18 LAB — CBC
HCT: 27 % — ABNORMAL LOW (ref 36.0–46.0)
Hemoglobin: 8.6 g/dL — ABNORMAL LOW (ref 12.0–15.0)
MCH: 28.4 pg (ref 26.0–34.0)
MCHC: 31.9 g/dL (ref 30.0–36.0)
MCV: 89.1 fL (ref 80.0–100.0)
Platelets: 206 K/uL (ref 150–400)
RBC: 3.03 MIL/uL — ABNORMAL LOW (ref 3.87–5.11)
RDW: 15 % (ref 11.5–15.5)
WBC: 12.5 K/uL — ABNORMAL HIGH (ref 4.0–10.5)
nRBC: 0 % (ref 0.0–0.2)

## 2024-09-18 LAB — LEGIONELLA PNEUMOPHILA SEROGP 1 UR AG: L. pneumophila Serogp 1 Ur Ag: NEGATIVE

## 2024-09-18 LAB — CK TOTAL AND CKMB (NOT AT ARMC)
CK, MB: 4.5 ng/mL (ref 0.5–5.0)
CK, MB: 2.9 ng/mL (ref 0.5–5.0)
Total CK: 33 U/L — ABNORMAL LOW (ref 38–234)
Total CK: 24 U/L — ABNORMAL LOW (ref 38–234)

## 2024-09-18 LAB — GLUCOSE, CAPILLARY
Glucose-Capillary: 121 mg/dL — ABNORMAL HIGH (ref 70–99)
Glucose-Capillary: 106 mg/dL — ABNORMAL HIGH (ref 70–99)
Glucose-Capillary: 108 mg/dL — ABNORMAL HIGH (ref 70–99)
Glucose-Capillary: 205 mg/dL — ABNORMAL HIGH (ref 70–99)

## 2024-09-18 LAB — PROCALCITONIN: Procalcitonin: 0.68 ng/mL

## 2024-09-18 MED ORDER — AMIODARONE HCL IN DEXTROSE 360-4.14 MG/200ML-% IV SOLN
30.0000 mg/h | INTRAVENOUS | Status: DC
  Administered 2024-09-18 – 2024-09-20 (×5): 30 mg/h via INTRAVENOUS
  Filled 2024-09-18 (×4): qty 200

## 2024-09-18 MED ORDER — POTASSIUM CHLORIDE 20 MEQ PO PACK
40.0000 meq | PACK | Freq: Once | ORAL | Status: AC
  Administered 2024-09-18: 40 meq via ORAL
  Filled 2024-09-18: qty 2

## 2024-09-18 MED ORDER — AMIODARONE LOAD VIA INFUSION: 150.0000 mg | Freq: Once | INTRAVENOUS | Status: DC

## 2024-09-18 MED ORDER — PIPERACILLIN-TAZOBACTAM 3.375 G IVPB
3.3750 g | Freq: Three times a day (TID) | INTRAVENOUS | Status: AC
  Administered 2024-09-18 – 2024-09-19 (×4): 3.375 g via INTRAVENOUS
  Filled 2024-09-18 (×4): qty 50

## 2024-09-18 MED ORDER — AMIODARONE HCL IN DEXTROSE 360-4.14 MG/200ML-% IV SOLN: 60.0000 mg/h | INTRAVENOUS | Status: DC

## 2024-09-18 MED ORDER — AMLODIPINE BESYLATE 5 MG PO TABS
5.0000 mg | ORAL_TABLET | Freq: Every day | ORAL | Status: DC
  Administered 2024-09-18 – 2024-09-19 (×2): 5 mg via ORAL
  Filled 2024-09-18 (×2): qty 1

## 2024-09-18 MED ORDER — AMIODARONE LOAD VIA INFUSION
150.0000 mg | Freq: Once | INTRAVENOUS | Status: AC
  Administered 2024-09-18: 150 mg via INTRAVENOUS
  Filled 2024-09-18: qty 83.34

## 2024-09-18 MED ORDER — AMIODARONE HCL IN DEXTROSE 360-4.14 MG/200ML-% IV SOLN
60.0000 mg/h | INTRAVENOUS | Status: AC
  Administered 2024-09-18 (×2): 60 mg/h via INTRAVENOUS
  Filled 2024-09-18: qty 200

## 2024-09-18 MED ORDER — AMIODARONE HCL IN DEXTROSE 360-4.14 MG/200ML-% IV SOLN: 30.0000 mg/h | INTRAVENOUS | Status: DC

## 2024-09-18 MED ORDER — PANTOPRAZOLE SODIUM 40 MG PO TBEC
40.0000 mg | DELAYED_RELEASE_TABLET | Freq: Every day | ORAL | Status: DC
  Administered 2024-09-18 – 2024-09-29 (×12): 40 mg via ORAL
  Filled 2024-09-18 (×6): qty 1

## 2024-09-18 NOTE — Progress Notes (Addendum)
 CARDIAC REHAB PHASE I     Post stent/NSTEMI education including MI booklet, site care, restrictions, risk factors, exercise guidelines, NTG use, antiplatelet therapy importance, heart healthy diet, and CRP2 reviewed. All questions and concerns addressed. Will refer to [facility] for CRP2.    2:25-2:45 Isaiah JAYSON [place], RN BSN 09/18/2024 2:45 PM

## 2024-09-18 NOTE — Discharge Instructions (Signed)

## 2024-09-18 NOTE — Progress Notes (Signed)
 Pt reverted back into Afib, V-rates low 100s-110s.   Start IV amio, bolus 150 followed by gtt at 60/hr   Ok to transfer to [facility].   Caffie Shed, PA-C

## 2024-09-18 NOTE — Progress Notes (Addendum)
 "    Advanced Heart Failure Rounding Note  Cardiologist: Dub Huntsman, DO   Patient Profile   Alyssa Hudson is a 83 y.o. female with history of HTN, hx pericarditis in 2021, SVT, CKD. Presented with worsening dyspnea, hypoxia and troponin elevation  Significant events:  12/20: presented with shortness of breath and fall 12/21: Acute respiratory failure and NSTEMI w/ worsening ST changes on ECG. Abx d/t concern for PNA. Urgent LHC - s/p PCI/DES p LAD, PCI/placement 2 overlapping DES p LCX. LVEDP 21. Intubated post cath. Lactic acid 3. 12/23 Extubated. Diuretics held d/t AKI   Subjective:    Stable on RA, O2 sats 96%. Remains on Zosyn  for PNA. PCT down trending   CVP 2-3. BMP pending. Total CK, CK MB down trending   Back in NSR this morning. SBPs 150s-160s   No CP. Ambulated w/ PT this morning and had mild dyspnea + exertional fatigue.  Objective:   Weight Range: 69.7 kg Body mass index is 24.8 kg/m.   Vital Signs:   Temp:  [97.9 F (36.6 C)-98.6 F (37 C)] 98.3 F (36.8 C) (12/24 0733) Pulse Rate:  [62-94] 82 (12/24 0820) Resp:  [20-32] 22 (12/24 0820) BP: (115-168)/(46-111) 153/54 (12/24 0820) SpO2:  [87 %-100 %] 98 % (12/24 0820) Weight:  [69.7 kg] 69.7 kg (12/24 0446) Last BM Date : 09/18/24  Weight change: Filed Weights   09/16/24 0500 09/17/24 0500 09/18/24 0446  Weight: 71.3 kg 74 kg 69.7 kg    Intake/Output:   Intake/Output Summary (Last 24 hours) at 09/18/2024 9078 Last data filed at 09/18/2024 0802 Gross per 24 hour  Intake 781.03 ml  Output --  Net 781.03 ml     Physical Exam   General: elderly F, fatigued appearing. NAD, left internal jugular CVC   Cor: RRR. No MRG Lungs: decreased BS at the bases w/ RLL crackles  Abdomen: soft, NT, nD  Extremities: LLE swollen + ecchymosis (d/t pre-admit fall). No pitting edema  Neuro: A&O x3. Affect pleasant    Telemetry   NSR, 70s. No further Afib, personally reviewed   Labs   CBC Recent Labs     09/17/24 0424 09/18/24 0456  WBC 9.8 12.5*  HGB 8.0* 8.6*  HCT 24.4* 27.0*  MCV 88.1 89.1  PLT 159 206   Basic Metabolic Panel Recent Labs    87/77/74 0415 09/17/24 0424  NA 136 138  K 4.1 4.0  CL 98 100  CO2 22 24  GLUCOSE 164* 139*  BUN 78* 102*  CREATININE 2.19* 2.56*  CALCIUM  8.7* 8.4*  MG 2.6*  --    Liver Function Tests Recent Labs    09/17/24 0424  AST 17  ALT 15  ALKPHOS 61  BILITOT 0.3  PROT 5.4*  ALBUMIN 3.1*   No results for input(s): LIPASE, AMYLASE in the last 72 hours. Cardiac Enzymes Recent Labs    09/16/24 1406 09/17/24 0424 09/18/24 0456  CKTOTAL 95 42 33*  CKMB 14.6* 5.6* 4.5    BNP: BNP (last 3 results) No results for input(s): BNP in the last 8760 hours.  ProBNP (last 3 results) Recent Labs    09/14/24 1004  PROBNP 18,334.0*     D-Dimer No results for input(s): DDIMER in the last 72 hours. Hemoglobin A1C No results for input(s): HGBA1C in the last 72 hours. Fasting Lipid Panel Recent Labs    09/18/24 0456  CHOL 128  HDL 36*  LDLCALC 51  TRIG 795*  CHOLHDL 3.6   Medications:  Scheduled Medications:  sodium chloride    Intravenous Once   apixaban   2.5 mg Oral BID   aspirin   81 mg Oral Daily   atorvastatin   40 mg Oral Daily   Chlorhexidine  Gluconate Cloth  6 each Topical Daily   clopidogrel   75 mg Oral Q breakfast   feeding supplement  237 mL Oral BID BM   pantoprazole  (PROTONIX ) IV  40 mg Intravenous QHS   polyethylene glycol  17 g Oral Daily   sodium chloride  flush  3 mL Intravenous Q12H    Infusions:  piperacillin -tazobactam (ZOSYN )  IV Stopped (09/18/24 0534)    PRN Medications: acetaminophen , fentaNYL  (SUBLIMAZE ) injection, haloperidol  lactate, ipratropium-albuterol , mouth rinse, sodium chloride  flush  Assessment/Plan   Acute hypoxic respiratory failure RLL PNA - Extubated 12/23. Stable on RA  - Abx per CCM. Stopped Vancomycin , now on Zosyn   - PCT improving   2. NSTEMI/ CAD -s/p  PCI/DES p LAD, PCI/placement 2 overlapping DES p LCX -has residual RCA and diagonal disease which is being treated medically -denies CP  -DAPT with aspirin  + plavix , will eventually need to stop aspirin  and continue plavix  as single antiplatelet with need for anticoagulation -statin, atorva 40 mg (LDL 51)   3. Shock -? Mixed cardiogenic and septic, now resolved  -Lactic acid peaked to 3, now cleared -Off NE -Echo with EF 55-60%, grade III DD, RV okay, mild to moderate MR, dilated IVC   4. AKI on CKD - Scr 1.5 several years ago -1.9 on admission, 2.6 yesterday and BUN > 100 - Repeat BMP pending  - Suspect ATN +/- CIN - CVP 3 today, continue to hold diuretics. Encouraged increasing PO hydration today   - follow BMP   5. UTI - Urine culture grew >100,000 colonies proteus mirabilis - Abx per CCM  6. LLE swelling/ecchymosis - d/t pre-admit mechanical fall  - CT negative for fx  - No DVT on US  - supportive care   7. Anemia - Hgb 9.2>>7.7>8 - 1 u RBCs 12/22  8. Atrial fibrillation - Noted on tele 12/23 - Now back in SR - Continue eliquis  2.5 mg BID (reduced dose d/t age and Scr)   7. Hypertension - SBPs 150s-160s - will slowly add back home meds, careful not to drop BP too low to help w/ renal perfusion  - start amlodipine  5 mg (10 mg home dose) - continue to hold losartan /hydrochlorothiazide  given AKI   CCM considering possible transfer out of ICU today. Pending BMP, if renal fx stable, would be ok from cardiac standpoint for transfer.      Length of Stay: [address] [place]ON[place]  09/18/2024, 9:21 AM  Advanced Heart Failure Team Pager [phone] (M-F; 7a - 5p)   Please visit [email or web address]: For overnight coverage please call cardiology fellow first. If fellow not available call Shock/ECMO MD on call.  For ECMO / Mechanical Support (Impella, IABP, LVAD) issues call Shock / ECMO MD on call.    Patient seen and examined with the above-signed Advanced Practice  Provider and/or Housestaff. I personally reviewed laboratory data, imaging studies and relevant notes. I independently examined the patient and formulated the important aspects of the plan. I have edited the note to reflect any of my changes or salient points. I have personally discussed the plan with the patient and/or family.  Feeling much better. Denies CP or SOB. Developed recurrent AF today  Scr improving   General:  Sitting up in chair No resp difficulty HEENT: normal Neck: supple. no JVD.  Cor: Irregular rate & rhythm. No rubs, gallops or murmurs. Lungs: decreased Abdomen: soft, nontender, nondistended.Good bowel sounds. Extremities: no cyanosis, clubbing, rash, LLE edema and ecchymoses Neuro: alert & orientedx3, cranial nerves grossly intact. moves all 4 extremities w/o difficulty. Affect pleasant  Continue abx per CCM. Continue DAPT. Start IV amio. Continue eliquis  at 2.5 bid. After 30 days drop ASA. Place compression stocking. OK for 2C.   Toribio Fuel, MD  5:20 PM      "

## 2024-09-18 NOTE — Progress Notes (Signed)
 "  NAME:  Alyssa Hudson, MRN:  [id number], DOB:  [birth year], LOS: 4 ADMISSION DATE:  09/14/2024, CONSULTATION DATE:  09/15/24 REFERRING MD:  BERNIS, CHIEF COMPLAINT:  Resp distress    History of Present Illness:  Alyssa Hudson is a 83 year old female w/ hx of afib, aortic stenosis presenting with 2 months of worsening SOB.  Got worse a week ago accompanied by anorexia and then went to ER.  Found to have RLL infiltrate, +SIRS criteria and mild trop leak, admitted to [facility].  Unfortunately developed worsening SOB this am and new EKG changes.  Cardiology evaluating for cath lab but resp status tenuous.  Her biggest complaint besides SOB is anxiety.  She feels better with BIPAP in place.  Pertinent  Medical History   Past Medical History:  Diagnosis Date   Acute diastolic heart failure (HCC) 06/30/2020   Acute pericarditis 07/24/2020   Aftercare following surgery 06/09/2016   Aortic valve sclerosis 2021   Moderate per echo in 2021   Atrial fibrillation (HCC) 07/24/2020   BMI 33.0-33.9,adult    Carpal tunnel syndrome, right 08/13/2020   Cervical radiculopathy 08/13/2020   Cervical radiculopathy due to osteoarthritis of spine    Diverticulosis    Encephalopathy 06/19/2020   Facial droop    Generalized osteoarthritis of multiple sites    Hypertension    Hyponatremia 06/20/2020   Primary hypertension 07/24/2020   Tricompartment osteoarthritis of right knee    Ulnar neuropathy at elbow, right 08/13/2020   Viral pericarditis with pericardial effusion 06/30/2020     Significant Hospital Events: Including procedures, antibiotic start and stop dates in addition to other pertinent events   12/20 admit w/ SOB and fall three d prior  12/21 left heart cath 3V disease. DES to LAD and prox circ. Shock post heart cath. Lactate elevated 12/22 off pressors. Getting blood failed SBT.  12/23 Passed SBT, following commands, CVP 5-7, extubate  12/24 Patient improving, not needing BIPAP overnight, CVP  1-2, mobilizing, kidney function improving   Interim History / Subjective:  Pleasant No complaints, issues overnight  Little nauseous when eating breakfast but has subsided   CVP 1-2   Objective    Blood pressure 137/72, pulse (!) 101, temperature 98.3 F (36.8 C), temperature source Oral, resp. rate (!) 23, height 5' 6 (1.676 m), weight 69.7 kg, SpO2 95%. CVP:  [1 mmHg-6 mmHg] 3 mmHg      Intake/Output Summary (Last 24 hours) at 09/18/2024 1113 Last data filed at 09/18/2024 1001 Gross per 24 hour  Intake 772.11 ml  Output 150 ml  Net 622.11 ml   Filed Weights   09/16/24 0500 09/17/24 0500 09/18/24 0446  Weight: 71.3 kg 74 kg 69.7 kg    Examination: General: acute on chronic older adult female, sitting up  HEENT: Normocephalic, PERRLA intact  Pink MM CV: s1,s2, RRR, no MRG, No JVD  pulm: clear, diminished, no distress  Abs: bs active, soft  Extremities: no edema, left leg ecchymosis, moves all extremities on command  Skin: no rash  Neuro: Rass 0,  responds to painful stimuli, cough gag reflex present  GU: deferred   Resolved problem list   Assessment and Plan   Acute hypoxemic respiratory failure 2/2 PNA c/b NSTEMI- resolving  Extubated on 12/23  -MRSA neg Strep pna neg  Legionella pending- in process  P: Continue zosyn  and azithromycin  for now  On RA, sitting up in chair, O2 sats > 92% Will dc BIPAP, has not needed  HF team continue to follow,  appreciate assistance  Will transfer patient out of ICU, later today to progressive  Continue to follow up with legionella- still in process  Holding off diuresis, CVP 1-2, remove CVC as well prior to trx   NSTEMI 2/2 severe 3V CAD c/b acute diastolic HF; Now s/p DES to LAD, prox circ. Cardiogenic shock resolved. No longer on pressors, co-ox stable  Lactic Acidosis cleared  P: Continue to monitor cardiac tele Continue DAPT  Continue statin   HTN Paroxysmal Afib Overnight on 12/22-12/23 Currently in  NSR P: Continue eliquis   Continue cardiac tele  Amlodipine  restarted per HF team, slowly adding back home meds  Recommend continue holding hydrochlorothiazide /losartan  for AKI   Acute on chronic renal failure.  CKD stage IIIb at baseline Cr improving  Hypokalemia  Serum creatinine baseline 1.4 2.19>2.56>1.76  P: Continue to trend renal function daily  Continue to monitor and optimize electrolytes daily Continue to monitor urine output Continue strict I/Os Continue Adequate renal perfusion  Avoid nephrotoxic agents  Reassess daily need for diuresis- holding off for today  Replace K   Acute blood loss anemia + hemodilution  Traumatic bursitis with large hematoma to left knee, no fracture -Hemoglobin dropping further Hgb now 8.6 from 1 unit transfusion of PRBC, from 7.7  DVT neg for PE  Bruising appears to be improving, no signs of compartment syndrome  P: Hgb improved slightly, transfuse for hgb  < 7  Continue DAPT  Monitor for signs of bleeding Fall precautions   Proteus UTI P: Continue abx as above   Dye allergy- ~30 years ago, got steroids before cath lab   Total time: 35 mins   Christian Shayleen Eppinger AGACNP-BC   [facility] 09/18/2024, 11:13 AM  Please see [email or web address] for pager details.  From 7A-7P if no response, please call [phone]. After hours, please call ELink [phone].         "

## 2024-09-18 NOTE — Progress Notes (Signed)
 PHARMACY NOTE:  ANTIMICROBIAL RENAL DOSAGE ADJUSTMENT  Current antimicrobial regimen includes a mismatch between antimicrobial dosage and estimated renal function.  As per policy approved by the Pharmacy & Therapeutics and Medical Executive Committees, the antimicrobial dosage will be adjusted accordingly.  Current antimicrobial dosage:  Zosyn  2.25g IV q8h  Indication: Pneumonia  Renal Function:  Estimated Creatinine Clearance: 21.9 mL/min (A) (by C-G formula based on SCr of 1.76 mg/dL (H)).    Antimicrobial dosage has been changed to:  Zosyn  3.375g IV q8h  Additional comments:   Thank you for allowing pharmacy to be a part of this patient's care.  Izetta Carl, PharmD PGY1 Pharmacy Resident

## 2024-09-18 NOTE — Evaluation (Signed)
 Physical Therapy Evaluation Patient Details Name: Alyssa Hudson MRN: [id number] DOB: Sep 12, 1941 Today's Date: 09/18/2024  History of Present Illness  Pt is an 83 y/o F presenting to ED on 12/20 with SOB, fall with LLE hematoma. CXR with bil PNA. Pt with RR called 12/21 for tachypnea on bipap, intubated 12/21 and transferred to ICU. Admitted for acute hypoxemic respiratory failure 2/2 PNA. Extubated 12/23. PMH includes HTN, A fib, cervical radiculopathy.   Clinical Impression  Pt in bed upon arrival of PT, agreeable to evaluation at this time. Prior to admission the pt was able to ambulate with use of rollator in the house, but reports falling ~1x/month for last 6 months leading to increased assist from spouse for ADLs. The pt was able to complete sit-stand transfer this morning with CGA assist and increased use of momentum, and completed x2 short bouts of ambulation in the room with BUE support on RW and minA to manage direction. Pt with L lateral drift needing intermittent assist to manage positioning. Pt with poor SpO2 pleth with gait, but quickly recovered to good pleth and SpO2 >90% with seated rest. Brief SpO2 low to 83% on RA, and to 86% on 2L, but SpO2 97% on RA at rest with good pleth. Pt endorses fatigue with short bout of ambulation, will continue to benefit from skilled PT acutely and HHPT after d/c to facilitate improved ambulation endurance and dynamic stability after return home.   BP: 160/54 (95) HR: 83 bpm prior to mobility SpO2 on RA at rest: 97% SpO2 on RA with gait: 83% (brief low with poor pleth, once pleth good SpO2 94%) SpO2 on 2L with gait: 86% (brief low with poor pleth, once pleth good SpO2 99%)    If plan is discharge home, recommend the following: A little help with walking and/or transfers;A little help with bathing/dressing/bathroom;Assistance with cooking/housework;Help with stairs or ramp for entrance   Can travel by private vehicle        Equipment  Recommendations Rolling walker (2 wheels)  Recommendations for Other Services       Functional Status Assessment Patient has had a recent decline in their functional status and demonstrates the ability to make significant improvements in function in a reasonable and predictable amount of time.     Precautions / Restrictions Precautions Precautions: Fall Recall of Precautions/Restrictions: Intact Precaution/Restrictions Comments: watch O2 (on 2L) Restrictions Weight Bearing Restrictions Per Provider Order: No      Mobility  Bed Mobility               General bed mobility comments: OOB in chair upon arrival and departure    Transfers Overall transfer level: Needs assistance Equipment used: Rolling walker (2 wheels) Transfers: Sit to/from Stand Sit to Stand: Min assist, Contact guard assist           General transfer comment: cues for hand placement, increased time    Ambulation/Gait Ambulation/Gait assistance: Min assist Gait Distance (Feet): 25 Feet (+ 25 ft) Assistive device: Rolling walker (2 wheels) Gait Pattern/deviations: Drifts right/left, Trunk flexed, Narrow base of support, Step-through pattern Gait velocity: decreased Gait velocity interpretation: <1.31 ft/sec, indicative of household ambulator   General Gait Details: drifts L during gait with minA to steady and correct RW positioning, SpO2 to 85% on initial bout on RA then 2L, low of 86% on 2L but with questionable pleth. once pleth good reading was 99%     Balance Overall balance assessment: Needs assistance Sitting-balance support: Feet supported Sitting balance-Leahy Scale:  Good     Standing balance support: During functional activity, Reliant on assistive device for balance Standing balance-Leahy Scale: Fair Standing balance comment: can static stand without UE support, minA and drifting to L with gait                             Pertinent Vitals/Pain Pain Assessment Pain  Assessment: No/denies pain    Home Living Family/patient expects to be discharged to:: Private residence Living Arrangements: Spouse/significant other Available Help at Discharge: Family;Available PRN/intermittently Type of Home: House Home Access: Stairs to enter Entrance Stairs-Rails: Can reach both Entrance Stairs-Number of Steps: 4   Home Layout: One level Home Equipment: Rollator (4 wheels);Shower seat;BSC/3in1;Cane - single point      Prior Function Prior Level of Function : Needs assist             Mobility Comments: rollator and cane, falls ~1x/month. in OPPT for balance and LE strengthening ADLs Comments: has had assist for LB ADLs since prior falls     Extremity/Trunk Assessment   Upper Extremity Assessment Upper Extremity Assessment: Defer to OT evaluation RUE Deficits / Details: hx of proximal humerus fx, now without restrictions per pt, 75% ROM compared to L UE RUE Coordination: decreased gross motor    Lower Extremity Assessment Lower Extremity Assessment: Generalized weakness;LLE deficits/detail LLE Deficits / Details: edema and hematoma of knee, grossly 3+/5 to MMT LLE Sensation: WNL LLE Coordination: WNL    Cervical / Trunk Assessment Cervical / Trunk Assessment: Normal  Communication   Communication Communication: Impaired Factors Affecting Communication: Hearing impaired    Cognition Arousal: Alert Behavior During Therapy: WFL for tasks assessed/performed   PT - Cognitive impairments: No apparent impairments                       PT - Cognition Comments: mild impact of HOH, not formally assessed but [employer] for session Following commands: Intact       Cueing Cueing Techniques: Verbal cues     General Comments General comments (skin integrity, edema, etc.): SpO2 94-99% on RA at rest, poor reading with gait with pt having increased work of breathing. 99% on 2L after gait once pleth good    Exercises Other Exercises Other  Exercises: standing marches x10, SpO2 to 90% on RA   Assessment/Plan    PT Assessment Patient needs continued PT services  PT Problem List Decreased strength;Cardiopulmonary status limiting activity;Decreased activity tolerance;Decreased balance;Decreased mobility       PT Treatment Interventions DME instruction;Gait training;Functional mobility training;Stair training;Therapeutic activities;Balance training;Therapeutic exercise;Patient/family education    PT Goals (Current goals can be found in the Care Plan section)  Acute Rehab PT Goals Patient Stated Goal: to return home PT Goal Formulation: With patient Time For Goal Achievement: 09/18/24 Potential to Achieve Goals: Good    Frequency Min 2X/week     Co-evaluation               AM-PAC PT 6 Clicks Mobility  Outcome Measure Help needed turning from your back to your side while in a flat bed without using bedrails?: None Help needed moving from lying on your back to sitting on the side of a flat bed without using bedrails?: None Help needed moving to and from a bed to a chair (including a wheelchair)?: A Little Help needed standing up from a chair using your arms (e.g., wheelchair or bedside chair)?: A Little Help needed  to walk in hospital room?: A Little Help needed climbing 3-5 steps with a railing? : A Lot 6 Click Score: 19    End of Session Equipment Utilized During Treatment: Gait belt;Oxygen Activity Tolerance: Patient tolerated treatment well Patient left: in chair;with Hudson bell/phone within reach Nurse Communication: Mobility status;Other (comment) (pt on RA at end of session) PT Visit Diagnosis: Unsteadiness on feet (R26.81);Muscle weakness (generalized) (M62.81)    Time: 9179-9144 PT Time Calculation (min) (ACUTE ONLY): 35 min   Charges:   PT Evaluation $PT Eval Low Complexity: 1 Low PT Treatments $Therapeutic Exercise: 8-22 mins PT General Charges $$ ACUTE PT VISIT: 1 Visit         Alyssa Hudson, PT, DPT   Acute Rehabilitation Department Office [phone] Secure Chat Communication Preferred  Alyssa JULIANNA Hudson 09/18/2024, 10:29 AM

## 2024-09-18 NOTE — Plan of Care (Signed)
  Problem: Education: Goal: Knowledge of General Education information will improve Description: Including pain rating scale, medication(s)/side effects and non-pharmacologic comfort measures Outcome: Progressing   Problem: Health Behavior/Discharge Planning: Goal: Ability to manage health-related needs will improve Outcome: Progressing   Problem: Clinical Measurements: Goal: Ability to maintain clinical measurements within normal limits will improve Outcome: Progressing Goal: Will remain free from infection Outcome: Progressing Goal: Diagnostic test results will improve Outcome: Progressing Goal: Respiratory complications will improve Outcome: Progressing Goal: Cardiovascular complication will be avoided Outcome: Progressing   Problem: Activity: Goal: Risk for activity intolerance will decrease Outcome: Progressing   Problem: Nutrition: Goal: Adequate nutrition will be maintained Outcome: Progressing   Problem: Coping: Goal: Level of anxiety will decrease Outcome: Progressing   Problem: Elimination: Goal: Will not experience complications related to bowel motility Outcome: Progressing Goal: Will not experience complications related to urinary retention Outcome: Progressing   Problem: Pain Managment: Goal: General experience of comfort will improve and/or be controlled Outcome: Progressing   Problem: Safety: Goal: Ability to remain free from injury will improve Outcome: Progressing   Problem: Skin Integrity: Goal: Risk for impaired skin integrity will decrease Outcome: Progressing   Problem: Activity: Goal: Ability to return to baseline activity level will improve Outcome: Progressing   Problem: Cardiovascular: Goal: Ability to achieve and maintain adequate cardiovascular perfusion will improve Outcome: Progressing Goal: Vascular access site(s) Level 0-1 will be maintained Outcome: Progressing

## 2024-09-18 NOTE — Plan of Care (Signed)
" °  Problem: Clinical Measurements: Goal: Will remain free from infection Outcome: Progressing Goal: Respiratory complications will improve Outcome: Progressing   Problem: Activity: Goal: Risk for activity intolerance will decrease Outcome: Progressing   Problem: Elimination: Goal: Will not experience complications related to bowel motility Outcome: Progressing Goal: Will not experience complications related to urinary retention Outcome: Progressing   Problem: Pain Managment: Goal: General experience of comfort will improve and/or be controlled Outcome: Progressing   Problem: Safety: Goal: Ability to remain free from injury will improve Outcome: Progressing   Problem: Activity: Goal: Ability to return to baseline activity level will improve Outcome: Progressing   Problem: Cardiovascular: Goal: Vascular access site(s) Level 0-1 will be maintained Outcome: Progressing   "

## 2024-09-19 ENCOUNTER — Encounter (HOSPITAL_COMMUNITY): Payer: Self-pay

## 2024-09-19 DIAGNOSIS — I5031 Acute diastolic (congestive) heart failure: Secondary | ICD-10-CM | POA: Diagnosis not present

## 2024-09-19 DIAGNOSIS — I214 Non-ST elevation (NSTEMI) myocardial infarction: Secondary | ICD-10-CM | POA: Diagnosis not present

## 2024-09-19 DIAGNOSIS — R0603 Acute respiratory distress: Secondary | ICD-10-CM | POA: Diagnosis not present

## 2024-09-19 DIAGNOSIS — N189 Chronic kidney disease, unspecified: Secondary | ICD-10-CM | POA: Diagnosis not present

## 2024-09-19 DIAGNOSIS — I480 Paroxysmal atrial fibrillation: Secondary | ICD-10-CM | POA: Diagnosis not present

## 2024-09-19 LAB — CBC
HCT: 26.9 % — ABNORMAL LOW (ref 36.0–46.0)
HCT: 28.4 % — ABNORMAL LOW (ref 36.0–46.0)
Hemoglobin: 8.7 g/dL — ABNORMAL LOW (ref 12.0–15.0)
Hemoglobin: 9.3 g/dL — ABNORMAL LOW (ref 12.0–15.0)
MCH: 28.7 pg (ref 26.0–34.0)
MCH: 28.5 pg (ref 26.0–34.0)
MCHC: 32.3 g/dL (ref 30.0–36.0)
MCHC: 32.7 g/dL (ref 30.0–36.0)
MCV: 88.8 fL (ref 80.0–100.0)
MCV: 87.1 fL (ref 80.0–100.0)
Platelets: 177 K/uL (ref 150–400)
Platelets: 219 K/uL (ref 150–400)
RBC: 3.03 MIL/uL — ABNORMAL LOW (ref 3.87–5.11)
RBC: 3.26 MIL/uL — ABNORMAL LOW (ref 3.87–5.11)
RDW: 15.1 % (ref 11.5–15.5)
RDW: 15.1 % (ref 11.5–15.5)
WBC: 8.8 K/uL (ref 4.0–10.5)
WBC: 11.8 K/uL — ABNORMAL HIGH (ref 4.0–10.5)
nRBC: 0 % (ref 0.0–0.2)
nRBC: 0 % (ref 0.0–0.2)

## 2024-09-19 LAB — CULTURE, BLOOD (ROUTINE X 2)
Culture: NO GROWTH
Culture: NO GROWTH
Special Requests: ADEQUATE

## 2024-09-19 LAB — BASIC METABOLIC PANEL WITH GFR
Anion gap: 11 (ref 5–15)
BUN: 67 mg/dL — ABNORMAL HIGH (ref 8–23)
CO2: 25 mmol/L (ref 22–32)
Calcium: 8.4 mg/dL — ABNORMAL LOW (ref 8.9–10.3)
Chloride: 104 mmol/L (ref 98–111)
Creatinine, Ser: 1.61 mg/dL — ABNORMAL HIGH (ref 0.44–1.00)
GFR, Estimated: 31 mL/min — ABNORMAL LOW
Glucose, Bld: 124 mg/dL — ABNORMAL HIGH (ref 70–99)
Potassium: 3.8 mmol/L (ref 3.5–5.1)
Sodium: 139 mmol/L (ref 135–145)

## 2024-09-19 LAB — FOLATE: Folate: >20 ng/mL

## 2024-09-19 LAB — VITAMIN B12: Vitamin B-12: 2594 pg/mL — ABNORMAL HIGH (ref 180–914)

## 2024-09-19 LAB — IRON AND TIBC
Iron: 25 ug/dL — ABNORMAL LOW (ref 28–170)
Saturation Ratios: 7 % — ABNORMAL LOW (ref 10.4–31.8)
TIBC: 353 ug/dL (ref 250–450)
UIBC: 328 ug/dL

## 2024-09-19 LAB — GLUCOSE, CAPILLARY: Glucose-Capillary: 146 mg/dL — ABNORMAL HIGH (ref 70–99)

## 2024-09-19 LAB — PROCALCITONIN: Procalcitonin: 0.39 ng/mL

## 2024-09-19 MED ORDER — ASPIRIN 81 MG PO TBEC
81.0000 mg | DELAYED_RELEASE_TABLET | Freq: Every day | ORAL | Status: DC
  Administered 2024-09-20 – 2024-09-30 (×11): 81 mg via ORAL
  Filled 2024-09-19 (×4): qty 1

## 2024-09-19 MED ORDER — FUROSEMIDE 10 MG/ML IJ SOLN
80.0000 mg | Freq: Once | INTRAMUSCULAR | Status: AC
  Administered 2024-09-19: 80 mg via INTRAVENOUS
  Filled 2024-09-19: qty 8

## 2024-09-19 MED ORDER — DICLOFENAC SODIUM 1 % EX GEL
2.0000 g | Freq: Four times a day (QID) | CUTANEOUS | Status: DC | PRN
  Administered 2024-09-19: 2 g via TOPICAL
  Filled 2024-09-19: qty 100

## 2024-09-19 MED ORDER — POTASSIUM CHLORIDE CRYS ER 20 MEQ PO TBCR
20.0000 meq | EXTENDED_RELEASE_TABLET | Freq: Once | ORAL | Status: AC
  Administered 2024-09-19: 20 meq via ORAL
  Filled 2024-09-19: qty 1

## 2024-09-19 NOTE — Progress Notes (Signed)
 [facility] Progress Note Patient: Alyssa Hudson FMW:[id number] DOB: Jul 04, 1941  DOA: 09/14/2024 DOS: the patient was seen and examined on 09/19/2024  Brief Hospital Course: Patient with PMH of aortic stenosis, chronic HFpEF, chronic A-fib, HTN presented to the hospital with cough and shortness of breath. Found to have pneumonia.  Initially admitted to [facility] TS service. Troponins were elevated.  Cardiology was consulted. Rapid response was called on 12/21. Underwent cardiac catheterization.  PCCM was consulted.  Patient was intubated on the vent. Now transferred out of the ICU or hospitalist service.  Assessment and Plan: Acute apneic respiratory failure. Ventilator dependent. Secondary pneumonia as well as non-STEMI. Was intubated on 12/21. Extubated on 12/23. MRSA negative.  Cultures are so far negative. Treated with IV Zosyn .  Which I will continue. Discontinue BiPAP. Transfer out of the ICU.  Non-STEMI. Severe triple-vessel disease. Acute diastolic CHF. Status post PCI to DES to LAD. Cardiogenic shock. Shock currently resolved. Required pressors out of the ICU. Currently on DAPT. Currently on statin. Management per cardiology.  Paroxysmal A-fib. Currently being treated with amiodarone . On Eliquis  for anticoagulation. Monitor.  HTN. Blood pressure stable for now. For now monitor and continue current medication.  AKI on CKD 3B. Baseline creatinine appears to be around 1.4. Serum creatinine peaked at 2.56. Likely secondary to hemodynamic instability and shock. Currently improving back to around 1.6. Monitor.  Acute blood loss anemia. Patient had an injury and a fall a few days ago. Sustained significant hematoma of the left leg. Hemoglobin dropped to 7.7. Transfuse for hemoglobin less than 8. Continue DAPT. Monitor H&H. Iron deficiency noted on the workup.  B12 folic acid normal.  Proteus UTI. Currently on antibiotic.   Subjective: Denies any acute  complaint.  No nausea, no fever no chills.  No chest pain.  Physical Exam: Basal crackles. S1-S2 present. Bowel sounds present. Left leg edema with significant hematoma.  Data Reviewed: I have Reviewed nursing notes, Vitals, and Lab results. Since last encounter, pertinent lab results CBC and BMP   . I have ordered test including CBC and BMP  .   Disposition: Status is: Inpatient Remains inpatient appropriate because: Monitor for improvement in rate control  apixaban  (ELIQUIS ) tablet 2.5 mg Start: 09/17/24 1615 apixaban  (ELIQUIS ) tablet 2.5 mg    Family Communication: Family at bedside Level of care: Progressive   Vitals:   09/18/24 2338 09/19/24 0307 09/19/24 0800 09/19/24 1223  BP: 127/67 (!) 136/59 (!) 160/55 (!) 145/72  Pulse: 89 84 98 97  Resp: (!) 24 (!) 25 (!) 29 18  Temp: 97.6 F (36.4 C) 98.2 F (36.8 C)  98.2 F (36.8 C)  TempSrc: Oral Oral  Oral  SpO2: 92% 95% 97% 92%  Weight:  71.2 kg    Height:         Author: Yetta Blanch, MD 09/19/2024 5:30 PM  Please look on [email or web address] to find out who is on call.

## 2024-09-19 NOTE — Progress Notes (Signed)
 "    Advanced Heart Failure Rounding Note  Cardiologist: Dub Huntsman, DO   Patient Profile   Alyssa Hudson is a 83 y.o. female with history of HTN, hx pericarditis in 2021, SVT, CKD. Presented with worsening dyspnea, hypoxia and troponin elevation  Significant events:  12/20: presented with shortness of breath and fall 12/21: Acute respiratory failure and NSTEMI w/ worsening ST changes on ECG. Abx d/t concern for PNA. Urgent LHC - s/p PCI/DES p LAD, PCI/placement 2 overlapping DES p LCX. LVEDP 21. Intubated post cath. Lactic acid 3. 12/23 Extubated. Diuretics held d/t AKI   Subjective:    Back in AF this am. Breathing slightly worse. Weight up 4 pounds   Objective:   Weight Range: 71.2 kg Body mass index is 25.34 kg/m.   Vital Signs:   Temp:  [97.6 F (36.4 C)-98.6 F (37 C)] 98.6 F (37 C) (12/25 2010) Pulse Rate:  [84-98] 96 (12/25 2010) Resp:  [18-29] 18 (12/25 1223) BP: (127-160)/(55-72) 150/60 (12/25 2010) SpO2:  [92 %-97 %] 97 % (12/25 2010) Weight:  [71.2 kg] 71.2 kg (12/25 0307) Last BM Date : 09/19/24  Weight change: Filed Weights   09/17/24 0500 09/18/24 0446 09/19/24 0307  Weight: 74 kg 69.7 kg 71.2 kg    Intake/Output:   Intake/Output Summary (Last 24 hours) at 09/19/2024 2012 Last data filed at 09/19/2024 2010 Gross per 24 hour  Intake 480 ml  Output 2700 ml  Net -2220 ml     Physical Exam   General:  Sitting up in chair HEENT: normal left facial droop  Neck: supple.JVP to jaw  Cor: Irregular rate & rhythm. No rubs, gallops or murmurs. Lungs: clear Abdomen: soft, nontender, nondistended.Good bowel sounds. Extremities: no cyanosis, clubbing, rash, 1+  edema ecchymosis LLE Neuro: alert & orientedx3,  Affect pleasant   Telemetry   AF 90s Personally reviewed   Labs   CBC Recent Labs    09/19/24 0240 09/19/24 1558  WBC 8.8 11.8*  HGB 8.7* 9.3*  HCT 26.9* 28.4*  MCV 88.8 87.1  PLT 177 219   Basic Metabolic Panel Recent Labs     09/18/24 0941 09/19/24 0240  NA 139 139  K 3.3* 3.8  CL 103 104  CO2 25 25  GLUCOSE 112* 124*  BUN 80* 67*  CREATININE 1.76* 1.61*  CALCIUM  8.5* 8.4*   Liver Function Tests Recent Labs    09/17/24 0424  AST 17  ALT 15  ALKPHOS 61  BILITOT 0.3  PROT 5.4*  ALBUMIN 3.1*   No results for input(s): LIPASE, AMYLASE in the last 72 hours. Cardiac Enzymes Recent Labs    09/17/24 0424 09/18/24 0456 09/18/24 1823  CKTOTAL 42 33* 24*  CKMB 5.6* 4.5 2.9    BNP: BNP (last 3 results) No results for input(s): BNP in the last 8760 hours.  ProBNP (last 3 results) Recent Labs    09/14/24 1004  PROBNP 18,334.0*     D-Dimer No results for input(s): DDIMER in the last 72 hours. Hemoglobin A1C No results for input(s): HGBA1C in the last 72 hours. Fasting Lipid Panel Recent Labs    09/18/24 0456  CHOL 128  HDL 36*  LDLCALC 51  TRIG 795*  CHOLHDL 3.6   Medications:   Scheduled Medications:  amLODipine   5 mg Oral Daily   apixaban   2.5 mg Oral BID   [START ON 09/20/2024] aspirin  EC  81 mg Oral Daily   atorvastatin   40 mg Oral Daily   Chlorhexidine  Gluconate  Cloth  6 each Topical Daily   clopidogrel   75 mg Oral Q breakfast   feeding supplement  237 mL Oral BID BM   pantoprazole   40 mg Oral QHS   sodium chloride  flush  3 mL Intravenous Q12H    Infusions:  amiodarone  30 mg/hr (09/19/24 0854)    PRN Medications: acetaminophen , diclofenac  Sodium, ipratropium-albuterol , mouth rinse, sodium chloride  flush  Assessment/Plan   Acute hypoxic respiratory failure RLL PNA - Extubated 12/23. Stable on RA  - Abx per CCM. Stopped Vancomycin , now on Zosyn   - PCT improving   2. NSTEMI/ CAD -s/p PCI/DES p LAD, PCI/placement 2 overlapping DES p LCX -has residual RCA and diagonal disease which is being treated medically -DAPT with aspirin  + plavix . On Eliquis  for AF. Stop ASA in 30 days -statin, atorva 40 mg (LDL 51)   3. Shock -? Mixed cardiogenic and septic,  now resolved  -Lactic acid peaked to 3, now cleared -Off NE -Echo with EF 55-60%, grade III DD, RV okay, mild to moderate MR, dilated IVC  4. AKI on CKD - Scr 1.5 several years ago -1.9 on admission, 2.6 yesterday and BUN > 100 - Suspect ATN +/- CIN - now improving Scr 1.6  5. Acute diastolic HF - volume up - give lasix  80 IV today  6. UTI - Urine culture grew >100,000 colonies proteus mirabilis - Abx per CCM  7. LLE swelling/ecchymosis - d/t pre-admit mechanical fall  - CT negative for fx  - No DVT on US  - supportive care   8. Atrial fibrillation. paroxsymal - Noted on tele 12/23. Converted to SR but now back in AF - Continue IV amio  - Continue eliquis  2.5 mg BID (reduced dose d/t age and Scr)  - NPO for possible DC-CV tomorrow  7. Hypertension - SBPs 150s-160s - adjust meds as needed  8. Hypokalemia - supp     Length of Stay: 5  Toribio Fuel, MD  09/19/2024, 8:12 PM  Advanced Heart Failure Team Pager [phone] (M-F; 7a - 5p)   Please visit [email or web address]: For overnight coverage please call cardiology fellow first. If fellow not available call Shock/ECMO MD on call.  For ECMO / Mechanical Support (Impella, IABP, LVAD) issues call Shock / ECMO MD on call.         "

## 2024-09-19 NOTE — Plan of Care (Signed)
   Problem: Clinical Measurements: Goal: Ability to maintain clinical measurements within normal limits will improve Outcome: Progressing   Problem: Clinical Measurements: Goal: Will remain free from infection Outcome: Progressing   Problem: Clinical Measurements: Goal: Diagnostic test results will improve Outcome: Progressing

## 2024-09-20 ENCOUNTER — Encounter (HOSPITAL_COMMUNITY): Payer: Self-pay

## 2024-09-20 ENCOUNTER — Inpatient Hospital Stay (HOSPITAL_COMMUNITY)

## 2024-09-20 DIAGNOSIS — I2510 Atherosclerotic heart disease of native coronary artery without angina pectoris: Secondary | ICD-10-CM

## 2024-09-20 DIAGNOSIS — R0603 Acute respiratory distress: Secondary | ICD-10-CM | POA: Diagnosis not present

## 2024-09-20 DIAGNOSIS — J9601 Acute respiratory failure with hypoxia: Principal | ICD-10-CM

## 2024-09-20 DIAGNOSIS — N179 Acute kidney failure, unspecified: Secondary | ICD-10-CM | POA: Diagnosis not present

## 2024-09-20 DIAGNOSIS — I214 Non-ST elevation (NSTEMI) myocardial infarction: Secondary | ICD-10-CM | POA: Diagnosis not present

## 2024-09-20 DIAGNOSIS — Z955 Presence of coronary angioplasty implant and graft: Secondary | ICD-10-CM

## 2024-09-20 LAB — BASIC METABOLIC PANEL WITH GFR
Anion gap: 13 (ref 5–15)
BUN: 49 mg/dL — ABNORMAL HIGH (ref 8–23)
CO2: 26 mmol/L (ref 22–32)
Calcium: 8.5 mg/dL — ABNORMAL LOW (ref 8.9–10.3)
Chloride: 101 mmol/L (ref 98–111)
Creatinine, Ser: 1.64 mg/dL — ABNORMAL HIGH (ref 0.44–1.00)
GFR, Estimated: 30 mL/min — ABNORMAL LOW
Glucose, Bld: 127 mg/dL — ABNORMAL HIGH (ref 70–99)
Potassium: 3.6 mmol/L (ref 3.5–5.1)
Sodium: 140 mmol/L (ref 135–145)

## 2024-09-20 LAB — CBC
HCT: 26.8 % — ABNORMAL LOW (ref 36.0–46.0)
Hemoglobin: 8.6 g/dL — ABNORMAL LOW (ref 12.0–15.0)
MCH: 28.1 pg (ref 26.0–34.0)
MCHC: 32.1 g/dL (ref 30.0–36.0)
MCV: 87.6 fL (ref 80.0–100.0)
Platelets: 184 K/uL (ref 150–400)
RBC: 3.06 MIL/uL — ABNORMAL LOW (ref 3.87–5.11)
RDW: 15 % (ref 11.5–15.5)
WBC: 10.1 K/uL (ref 4.0–10.5)
nRBC: 0.2 % (ref 0.0–0.2)

## 2024-09-20 LAB — MAGNESIUM: Magnesium: 2 mg/dL (ref 1.7–2.4)

## 2024-09-20 LAB — GLUCOSE, CAPILLARY: Glucose-Capillary: 155 mg/dL — ABNORMAL HIGH (ref 70–99)

## 2024-09-20 MED ORDER — FUROSEMIDE 10 MG/ML IJ SOLN: 40.0000 mg | Freq: Once | INTRAMUSCULAR | Status: AC

## 2024-09-20 MED ORDER — AMIODARONE HCL 200 MG PO TABS
200.0000 mg | ORAL_TABLET | Freq: Two times a day (BID) | ORAL | Status: AC
  Administered 2024-09-20 – 2024-09-23 (×7): 200 mg via ORAL
  Filled 2024-09-20 (×6): qty 1

## 2024-09-20 MED ORDER — HYDRALAZINE HCL 20 MG/ML IJ SOLN
INTRAMUSCULAR | Status: AC
  Filled 2024-09-20: qty 1

## 2024-09-20 MED ORDER — ALPRAZOLAM 0.25 MG PO TABS: 0.2500 mg | ORAL_TABLET | Freq: Once | ORAL | Status: DC

## 2024-09-20 MED ORDER — FUROSEMIDE 10 MG/ML IJ SOLN
INTRAMUSCULAR | Status: AC
  Administered 2024-09-20: 40 mg via INTRAVENOUS
  Filled 2024-09-20: qty 4

## 2024-09-20 MED ORDER — AMIODARONE HCL 200 MG PO TABS
200.0000 mg | ORAL_TABLET | Freq: Every day | ORAL | Status: DC
  Administered 2024-09-24 – 2024-09-25 (×2): 200 mg via ORAL

## 2024-09-20 MED ORDER — DILTIAZEM HCL 30 MG PO TABS
30.0000 mg | ORAL_TABLET | Freq: Four times a day (QID) | ORAL | Status: DC
  Administered 2024-09-20 – 2024-09-21 (×3): 30 mg via ORAL
  Filled 2024-09-20 (×3): qty 1

## 2024-09-20 MED ORDER — POTASSIUM CHLORIDE 10 MEQ/100ML IV SOLN
10.0000 meq | INTRAVENOUS | Status: DC
  Administered 2024-09-20: 10 meq via INTRAVENOUS
  Filled 2024-09-20: qty 100

## 2024-09-20 MED ORDER — HYDRALAZINE HCL 20 MG/ML IJ SOLN
10.0000 mg | Freq: Once | INTRAMUSCULAR | Status: AC
  Administered 2024-09-20: 10 mg via INTRAVENOUS

## 2024-09-20 MED ORDER — HYDRALAZINE HCL 10 MG PO TABS
10.0000 mg | ORAL_TABLET | Freq: Three times a day (TID) | ORAL | Status: DC
  Administered 2024-09-20 – 2024-09-21 (×4): 10 mg via ORAL
  Filled 2024-09-20 (×4): qty 1

## 2024-09-20 MED ORDER — FUROSEMIDE 10 MG/ML IJ SOLN
20.0000 mg | Freq: Every day | INTRAMUSCULAR | Status: DC
  Administered 2024-09-20 – 2024-09-21 (×2): 20 mg via INTRAVENOUS
  Filled 2024-09-20 (×2): qty 2

## 2024-09-20 MED ORDER — POTASSIUM CHLORIDE CRYS ER 20 MEQ PO TBCR
20.0000 meq | EXTENDED_RELEASE_TABLET | Freq: Once | ORAL | Status: AC
  Administered 2024-09-20: 20 meq via ORAL
  Filled 2024-09-20: qty 1

## 2024-09-20 NOTE — Care Management Obs Status (Signed)
 MEDICARE OBSERVATION STATUS NOTIFICATION   Patient Details  Name: Alyssa Hudson MRN: [id number] Date of Birth: August 07, 1941   Medicare Observation Status Notification Given:       Claretta Deed 09/20/2024, 2:55 PM

## 2024-09-20 NOTE — Significant Event (Signed)
 Rapid Response Event Note   Reason for Call :  Respiratory distress, hypoxia  Duoneb given at 2233  Initial Focused Assessment:  Pt lying in bed with eye open. She is in respiratory distress, +WOB, +accessory muscle use. She c/o SOB, denies CP/dizziness. Lungs with wheezes/crackles t/o.  ABD soft, NT. Skin cool/clammy/diaphoretic.   HR-100s, BP-190/79, RR-30s, SpO2-98% on NRB.   Interventions:  EKG-ST PCXR Bipap 40mg  Lasix  IV x 1 10mg  hydralazine  IV x 1 Plan of Care:  Pt placed on bipap. Give lasix /hydralazine  time to work. Monitor response. Await PCXR results. Continue to monitor pt closely. Please call RRT if further assistance needed.   Event Summary:   MD Notified: Dr. Cleatus notified by bedside RN, Dr.  Otelia notified by bedside RN can came to bedside.  Call Time:2302 Arrival Time:2310 End Time:2340  Tish Graeme Piety, RN

## 2024-09-20 NOTE — Care Management Important Message (Signed)
 Important Message  Patient Details  Name: Alyssa Hudson MRN: [id number] Date of Birth: [birth year]   Important Message Given:  Yes - Medicare IM     Claretta Deed 09/20/2024, 2:55 PM

## 2024-09-20 NOTE — Progress Notes (Signed)
 [facility] Progress Note Patient: Alyssa Hudson FMW:[id number] DOB: 1941-04-03  DOA: 09/14/2024 DOS: the patient was seen and examined on 09/20/2024  Brief Hospital Course: Patient with PMH of aortic stenosis, chronic HFpEF, chronic A-fib, HTN presented to the hospital with cough and shortness of breath. Found to have pneumonia.  Initially admitted to [facility] TS service. Troponins were elevated.  Cardiology was consulted. Rapid response was called on 12/21. Underwent cardiac catheterization.  PCCM was consulted.  Patient was intubated on the vent. Now transferred out of the ICU or hospitalist service.  Assessment and Plan: Acute hypoxic respiratory failure. Ventilator dependent. Secondary pneumonia as well as non-STEMI. Was intubated on 12/21. Extubated on 12/23. MRSA negative.  Cultures are so far negative. Treated with IV Zosyn .  Which I will continue.  Last day today on 12/26. Discontinue BiPAP. Transfer out of the ICU.  Non-STEMI. Severe triple-vessel disease. Acute diastolic CHF. Status post PCI to DES to LAD. Cardiogenic shock. Shock currently resolved. Required pressors out of the ICU. Currently on DAPT. Currently on statin. Management per cardiology.  Paroxysmal A-fib. Currently being treated with amiodarone . On Eliquis  for anticoagulation.  Initial plan was for cardioversion but currently sinus rhythm.  Monitor. Monitor.  HTN. Blood pressure stable for now. For now monitor and continue current medication.  AKI on CKD 3B. Baseline creatinine appears to be around 1.4. Serum creatinine peaked at 2.56. Likely secondary to hemodynamic instability and shock. Currently improving back to around 1.6. Monitor.  Acute blood loss anemia. Patient had an injury and a fall a few days ago. Sustained significant hematoma of the left leg. Hemoglobin dropped to 7.7. Transfuse for hemoglobin less than 8. Continue DAPT. Monitor H&H. Iron deficiency noted on the workup.   B12 folic acid normal.  Proteus UTI. Currently on antibiotic.  Acute diastolic CHF. Received IV Lasix . Appreciate cardiology consultation. Will monitor volume status.  Subjective: No nausea no vomiting no fever no chills.  Basal crackles.  Improving edema of the left leg.  Physical Exam: Basal crackles. S1-S2 present. Bowel sounds present. Left leg edema with significant hematoma.  Data Reviewed: I have Reviewed nursing notes, Vitals, and Lab results. Since last encounter, pertinent lab results CBC and BMP   . I have ordered test including CBC and BMP  .   Disposition: Status is: Inpatient Remains inpatient appropriate because: Monitor for improvement in rate control  apixaban  (ELIQUIS ) tablet 2.5 mg Start: 09/17/24 1615 apixaban  (ELIQUIS ) tablet 2.5 mg    Family Communication: Family at bedside Level of care: Progressive   Vitals:   09/20/24 1233 09/20/24 1335 09/20/24 1718 09/20/24 1722  BP: (!) 151/56 (!) 151/56 (!) 158/58 (!) 158/58  Pulse:   83   Resp:   18   Temp:   98 F (36.7 C)   TempSrc:   Oral   SpO2:   94%   Weight:      Height:         Author: Yetta Blanch, MD 09/20/2024 6:43 PM  Please look on [email or web address] to find out who is on call.

## 2024-09-20 NOTE — Progress Notes (Signed)
 Occupational Therapy Treatment Patient Details Name: Alyssa Hudson MRN: [id number] DOB: Jun 28, 1941 Today's Date: 09/20/2024   History of present illness Pt is an 83 y/o F presenting to ED on 12/20 with SOB, fall with LLE hematoma. CXR with bil PNA. Pt with RR called 12/21 for tachypnea on bipap, intubated 12/21 and transferred to ICU. Admitted for acute hypoxemic respiratory failure 2/2 PNA. Extubated 12/23. PMH includes HTN, A fib, cervical radiculopathy.   OT comments  Patient seated in recliner upon entry on RA with SpO2 at 96%.  Patient declined going to sink for self care due to BLE knee pain. Patient performed grooming standing at recliner with CGA.  Patient performed standing reaching tasks with CGA and tolerated 3 minutes of standing. Discharge recommendations continue to be appropriate. Acute OT to continue to follow to address established goals.       If plan is discharge home, recommend the following:  A little help with walking and/or transfers;A little help with bathing/dressing/bathroom;Assistance with cooking/housework;Assist for transportation;Help with stairs or ramp for entrance   Equipment Recommendations  None recommended by OT    Recommendations for Other Services      Precautions / Restrictions Precautions Precautions: Fall Recall of Precautions/Restrictions: Intact Precaution/Restrictions Comments: watch O2 Restrictions Weight Bearing Restrictions Per Provider Order: No       Mobility Bed Mobility               General bed mobility comments: OOB in chair upon arrival and departure    Transfers Overall transfer level: Needs assistance Equipment used: Rolling walker (2 wheels) Transfers: Sit to/from Stand Sit to Stand: Min assist, Contact guard assist           General transfer comment: cues for hand placement and min to CGA to power up     Balance Overall balance assessment: Needs assistance Sitting-balance support: Feet  supported Sitting balance-Leahy Scale: Good     Standing balance support: During functional activity, Reliant on assistive device for balance, Single extremity supported, Bilateral upper extremity supported Standing balance-Leahy Scale: Fair Standing balance comment: performed grooming tasks and reaching tasks with one extremity support                           ADL either performed or assessed with clinical judgement   ADL Overall ADL's : Needs assistance/impaired     Grooming: Wash/dry hands;Wash/dry face;Brushing hair;Contact guard assist;Standing                                 General ADL Comments: declined ADLs other than grooming    Extremity/[employer] Communication Communication: Impaired Factors Affecting Communication: Hearing impaired   Cognition Arousal: Alert Behavior During Therapy: WFL for tasks assessed/performed Cognition: No apparent impairments                               Following commands: Intact        Cueing   Cueing Techniques: Verbal cues  Exercises Exercises: Other exercises Other Exercises Other Exercises: reaching tasks in standing    Shoulder Instructions       General Comments SpO2 96% on RA seated and standing  Pertinent Vitals/ Pain       Pain Assessment Pain Assessment: Faces Faces Pain Scale: Hurts a little bit Pain Location: BLE knees Pain Descriptors / Indicators: Discomfort, Grimacing Pain Intervention(s): Limited activity within patient's tolerance, Monitored during session, Repositioned  Home Living                                          Prior Functioning/Environment              Frequency  Min 2X/week        Progress Toward Goals  OT Goals(current goals can now be found in the care plan section)  Progress towards OT goals: Progressing toward goals  Acute  Rehab OT Goals Patient Stated Goal: to feel better OT Goal Formulation: With patient Time For Goal Achievement: 10/01/24 Potential to Achieve Goals: Good ADL Goals Pt Will Perform Lower Body Dressing: with supervision;sitting/lateral leans;sit to/from stand;with adaptive equipment Pt Will Transfer to Toilet: with supervision;ambulating;regular height toilet Additional ADL Goal #1: pt will tolerate OOB standing activity x10 min in order to improve activity tolerance for ADLs  Plan      Co-evaluation                 AM-PAC OT 6 Clicks Daily Activity     Outcome Measure   Help from another person eating meals?: None Help from another person taking care of personal grooming?: A Little Help from another person toileting, which includes using toliet, bedpan, or urinal?: A Little Help from another person bathing (including washing, rinsing, drying)?: A Lot Help from another person to put on and taking off regular upper body clothing?: A Little Help from another person to put on and taking off regular lower body clothing?: A Lot 6 Click Score: 17    End of Session Equipment Utilized During Treatment: Gait belt;Rolling walker (2 wheels)  OT Visit Diagnosis: Other abnormalities of gait and mobility (R26.89);Unsteadiness on feet (R26.81);Muscle weakness (generalized) (M62.81);History of falling (Z91.81)   Activity Tolerance Patient tolerated treatment well   Patient Left in chair;with call bell/phone within reach   Nurse Communication Mobility status        Time: 9281-9260 OT Time Calculation (min): 21 min  Charges: OT General Charges $OT Visit: 1 Visit OT Treatments $Self Care/Home Management : 8-22 mins  Dick Laine, OTA Acute Rehabilitation Services  Office [phone]   Jeb LITTIE Laine 09/20/2024, 7:48 AM

## 2024-09-20 NOTE — Progress Notes (Addendum)
 Came by to see if pt wanted to walk. Pt stated she had just worked with OT and her breakfast was on the way. She declined walking at this time. Will check back later.   Augustin KATHEE Sharps, RRT, BSRT 09/20/2024 9:20 AM

## 2024-09-20 NOTE — Plan of Care (Signed)
  Problem: Education: Goal: Knowledge of General Education information will improve Description: Including pain rating scale, medication(s)/side effects and non-pharmacologic comfort measures Outcome: Progressing   Problem: Health Behavior/Discharge Planning: Goal: Ability to manage health-related needs will improve Outcome: Progressing   Problem: Clinical Measurements: Goal: Ability to maintain clinical measurements within normal limits will improve Outcome: Progressing Goal: Will remain free from infection Outcome: Progressing Goal: Diagnostic test results will improve Outcome: Progressing Goal: Respiratory complications will improve Outcome: Progressing Goal: Cardiovascular complication will be avoided Outcome: Progressing   Problem: Activity: Goal: Risk for activity intolerance will decrease Outcome: Progressing   Problem: Nutrition: Goal: Adequate nutrition will be maintained Outcome: Progressing   Problem: Coping: Goal: Level of anxiety will decrease Outcome: Progressing   Problem: Elimination: Goal: Will not experience complications related to bowel motility Outcome: Progressing Goal: Will not experience complications related to urinary retention Outcome: Progressing   Problem: Pain Managment: Goal: General experience of comfort will improve and/or be controlled Outcome: Progressing   Problem: Safety: Goal: Ability to remain free from injury will improve Outcome: Progressing   Problem: Skin Integrity: Goal: Risk for impaired skin integrity will decrease Outcome: Progressing   Problem: Education: Goal: Understanding of CV disease, CV risk reduction, and recovery process will improve Outcome: Progressing   Problem: Activity: Goal: Ability to return to baseline activity level will improve Outcome: Progressing   Problem: Cardiovascular: Goal: Ability to achieve and maintain adequate cardiovascular perfusion will improve Outcome: Progressing Goal:  Vascular access site(s) Level 0-1 will be maintained Outcome: Progressing   Problem: Health Behavior/Discharge Planning: Goal: Ability to safely manage health-related needs after discharge will improve Outcome: Progressing

## 2024-09-20 NOTE — Progress Notes (Signed)
 "   Rounding Note    Patient Name: Alyssa Hudson Date of Encounter: 09/20/2024  [facility] Cardiologist: Kardie Tobb, DO  Subjective   Sitting up in chair. Denies anginal chest pain or heart failure symptoms. Was up at night to going to the bathroom and given her IV Lasix . 3 L urine output over 24 hours   Inpatient Medications    Scheduled Meds:  amiodarone   200 mg Oral BID   Followed by   NOREEN ON 09/24/2024] amiodarone   200 mg Oral Daily   apixaban   2.5 mg Oral BID   aspirin  EC  81 mg Oral Daily   atorvastatin   40 mg Oral Daily   Chlorhexidine  Gluconate Cloth  6 each Topical Daily   clopidogrel   75 mg Oral Q breakfast   diltiazem   30 mg Oral Q6H   feeding supplement  237 mL Oral BID BM   furosemide   20 mg Intravenous Daily   hydrALAZINE   10 mg Oral Q8H   pantoprazole   40 mg Oral QHS   sodium chloride  flush  3 mL Intravenous Q12H   Continuous Infusions:  potassium chloride  10 mEq (09/20/24 0954)   PRN Meds: acetaminophen , diclofenac  Sodium, ipratropium-albuterol , mouth rinse, sodium chloride  flush   Vital Signs    Vitals:   09/19/24 2318 09/20/24 0430 09/20/24 0500 09/20/24 0852  BP:    (!) 149/54  Pulse: 84 76  79  Resp: (!) 30 (!) 24  20  Temp: 97.7 F (36.5 C) 98.3 F (36.8 C)  97.7 F (36.5 C)  TempSrc:    Oral  SpO2: 96% 96%  98%  Weight:   68.9 kg   Height:        Intake/Output Summary (Last 24 hours) at 09/20/2024 1003 Last data filed at 09/20/2024 0600 Gross per 24 hour  Intake 240 ml  Output 3000 ml  Net -2760 ml   Net IO Since Admission: -867.44 mL [09/20/24 1004]      09/20/2024    5:00 AM 09/19/2024    3:07 AM 09/18/2024    4:46 AM  Last 3 Weights  Weight (lbs) 151 lb 14.4 oz 157 lb 153 lb 10.6 oz  Weight (kg) 68.901 kg 71.215 kg 69.7 kg   ECG    No new tracing. - Personally Reviewed  Physical Exam   General: Age-appropriate, hemodynamically stable, no acute distress HEENT: Normocephalic, atraumatic,  trachea midline, no JVP Lungs: Clear to auscultation bilaterally in the upper lung fields with minimal rales bilaterally Heart: Regular, positive S1-S2, no murmurs rubs or gallops appreciated Abdomen: Soft, nontender, nondistended, shallow breathing Extremities: Left lower extremity bruising noted from knee and distal, right lower extremity warm and no swelling, pulses difficult to appreciate Neuro: Alert and oriented x 3, answers questions appropriately  Labs    High Sensitivity Troponin:  No results for input(s): TROPONINIHS in the last 720 hours.   Chemistry Recent Labs  Lab 09/14/24 1004 09/15/24 0510 09/15/24 1555 09/16/24 0415 09/17/24 0424 09/18/24 0941 09/19/24 0240 09/20/24 0228  NA 132* 136   < > 136 138 139 139 140  K 4.6 4.4   < > 4.1 4.0 3.3* 3.8 3.6  CL 96* 98  --  98 100 103 104 101  CO2 21* 19*  --  22 24 25 25 26   GLUCOSE 143* 114*  --  164* 139* 112* 124* 127*  BUN 53* 61*  --  78* 102* 80* 67* 49*  CREATININE 1.90* 1.89*   < > 2.19*  2.56* 1.76* 1.61* 1.64*  CALCIUM  8.6* 8.7*  --  8.7* 8.4* 8.5* 8.4* 8.5*  MG  --  2.6*  --  2.6*  --   --   --  2.0  PROT 6.6  --   --   --  5.4*  --   --   --   ALBUMIN 3.8  --   --   --  3.1*  --   --   --   AST 24  --   --   --  17  --   --   --   ALT 10  --   --   --  15  --   --   --   ALKPHOS 85  --   --   --  61  --   --   --   BILITOT 0.8  --   --   --  0.3  --   --   --   GFRNONAA 25* 26*   < > 21* 18* 28* 31* 30*  ANIONGAP 15 18*  --  16* 15 12 11 13    < > = values in this interval not displayed.    Lipids  Recent Labs  Lab 09/18/24 0456  CHOL 128  TRIG 204*  HDL 36*  LDLCALC 51  CHOLHDL 3.6    Hematology Recent Labs  Lab 09/19/24 0240 09/19/24 1558 09/20/24 0228  WBC 8.8 11.8* 10.1  RBC 3.03* 3.26* 3.06*  HGB 8.7* 9.3* 8.6*  HCT 26.9* 28.4* 26.8*  MCV 88.8 87.1 87.6  MCH 28.7 28.5 28.1  MCHC 32.3 32.7 32.1  RDW 15.1 15.1 15.0  PLT 177 219 184   Thyroid   Recent Labs  Lab 09/14/24 1004   TSH 3.910    BNP Recent Labs  Lab 09/14/24 1004  PROBNP 18,334.0*    DDimer No results for input(s): DDIMER in the last 168 hours.    Cardiac Studies   Echocardiogram 09/14/2024   1. Left ventricular ejection fraction, by estimation, is 55 to 60%. Left  ventricular ejection fraction by 2D MOD biplane is 57.2 %. The left  ventricle has normal function. The left ventricle has no regional wall  motion abnormalities. Left ventricular  diastolic parameters are consistent with Grade III diastolic dysfunction  (restrictive). Elevated left atrial pressure.   2. Right ventricular systolic function is normal. The right ventricular size is mildly enlarged. There is moderately elevated pulmonary artery systolic pressure. The estimated right ventricular systolic pressure is 50.0 mmHg.   3. Left atrial size was moderately dilated.   4. The mitral valve is degenerative. Mild to moderate mitral valve regurgitation.   5. Tricuspid valve regurgitation is moderate.   6. The aortic valve is calcified. Aortic valve regurgitation is not visualized. Aortic valve sclerosis/calcification is present, without any evidence of aortic stenosis.   7. The inferior vena cava is dilated in size with <50% respiratory variability, suggesting right atrial pressure of 15 mmHg.   Patient Profile     83 y.o. female history of HFpEF, hypertension, recent mechanical fall a week ago presents with dyspnea.  Significant events: 12/20: presented with shortness of breath and fall 12/21: Acute respiratory failure and NSTEMI w/ worsening ST changes on ECG. Abx d/t concern for PNA. Urgent LHC - s/p PCI/DES p LAD, PCI/placement 2 overlapping DES p LCX. LVEDP 21. Intubated post cath. Lactic acid 3. 12/23 Extubated. Diuretics held d/t AKI   Assessment & Plan    NSTEMI/CAD s/p PCI/DES  p LAD, PCI/placement 2 overlapping DES p LCX has residual RCA and diagonal disease which is being treated medically DAPT with aspirin  + plavix .  On Eliquis  for AF. Stop ASA in 30 days statin, atorva 40 mg (LDL 51)  EKG this morning   New onset of atrial fibrillation Noted on telemetry 09/17/2024 Transition back to sinus rhythm but reverted back to A-fib Currently on amiodarone  drip. Anticoagulation: Eliquis , renally dosed due to age and renal function Transition IV amiodarone  drip to p.o. Amiodarone  200 mg p.o. twice daily for 7 days. Followed by 200 mg p.o. daily Patient is aware that she will need monitoring for thyroid , liver, lung function and ophthalmologically exam and chest x-ray if she stays on Amiodarone  long term The need for long-term anticoagulation will defer to primary cardiology Of note, patient was n.p.o. for possible cardioversion.  Since she converted to sinus rhythm and is maintaining we will hold off on the procedure and continue to monitor on telemetry   Acute diastolic heart failure Stage C, NYHA class III Net IO Since Admission: -867.44 mL [09/20/24 1004] Discontinue amlodipine  due to lower extremity swelling Started hydralazine  10 mg 3 times daily as needed basis for blood pressures greater than 140 mmHg this will allow renal perfusion and avoid acute kidney injury. Started Lasix  20 mg IV push daily Monitor BUN and creatinine Uptitrate GDMT as renal function allows closer to discharge and once sinus rhythm is maintained  Acute hypoxic respiratory failure: Likely secondary to right lower lobe pneumonia Extubated 09/17/2024, remains on room air. Antibiotics per primary team -recommend minimizing IV antibiotics if possible to decrease IV fluids in the attempt of diuresis  Shock: Cardiogenic+/- septic (pneumonia and UTI) And now resolved Lactic acid peaked at 3 Off of pressor support and ventilator.  Acute kidney injury on chronic kidney disease Baseline creatinine 1.5 mg/dL, several years ago Creatinine peaked at 2.56 Patient received IV Lasix  80 mg x 1 yesterday Urine output 3 L over 24  hours Monitor BUN and creatinine. Avoid nephrotoxic agents  Lower extremity swelling/ecchymosis Present on arrival. Status post mechanical fall prior to admission CT negative for fracture. Venous duplex negative for DVT  Hypertension: Medications as discussed above  Plan of care discussed with nursing staff and primary team.  Outpatient follow being arranged.   For questions or updates, please contact [facility] Please consult [email or web address] for contact info under   Signed, Madonna Large, DO, [facility]  A Division of [facility] [address] [place], [place] [address]  10:04 AM   "

## 2024-09-20 NOTE — Progress Notes (Addendum)
 I was notified by the nurse that the patient developed sudden onset SOB and respiratory distress. I immediately went to evaluate the patient who was on 100% NRB with respiratory distress. SBP was in the 190s on 2 checks. Exam showed bilateral crackles. NRB was changed to BiPAP. CXR ordered. She was given 40 mg IV of Lasix  and hydralazine  40 mg IV. Most likely developed flash pulmonary edema in the setting of elevated BP. If BP does not respond to hydralazine , will start nitroglycerin  gtt and move to the ICU.

## 2024-09-20 NOTE — Plan of Care (Signed)
  Problem: Education: Goal: Knowledge of General Education information will improve Description: Including pain rating scale, medication(s)/side effects and non-pharmacologic comfort measures Outcome: Progressing   Problem: Health Behavior/Discharge Planning: Goal: Ability to manage health-related needs will improve Outcome: Progressing   Problem: Clinical Measurements: Goal: Will remain free from infection Outcome: Progressing Goal: Diagnostic test results will improve Outcome: Progressing   Problem: Activity: Goal: Risk for activity intolerance will decrease Outcome: Progressing   Problem: Nutrition: Goal: Adequate nutrition will be maintained Outcome: Progressing   Problem: Coping: Goal: Level of anxiety will decrease Outcome: Progressing

## 2024-09-21 DIAGNOSIS — I2510 Atherosclerotic heart disease of native coronary artery without angina pectoris: Secondary | ICD-10-CM | POA: Diagnosis not present

## 2024-09-21 DIAGNOSIS — I10 Essential (primary) hypertension: Secondary | ICD-10-CM | POA: Diagnosis not present

## 2024-09-21 DIAGNOSIS — I480 Paroxysmal atrial fibrillation: Secondary | ICD-10-CM | POA: Diagnosis not present

## 2024-09-21 DIAGNOSIS — N179 Acute kidney failure, unspecified: Secondary | ICD-10-CM | POA: Diagnosis not present

## 2024-09-21 DIAGNOSIS — R0603 Acute respiratory distress: Secondary | ICD-10-CM | POA: Diagnosis not present

## 2024-09-21 DIAGNOSIS — I5033 Acute on chronic diastolic (congestive) heart failure: Secondary | ICD-10-CM | POA: Diagnosis not present

## 2024-09-21 DIAGNOSIS — I214 Non-ST elevation (NSTEMI) myocardial infarction: Secondary | ICD-10-CM | POA: Diagnosis not present

## 2024-09-21 DIAGNOSIS — Z7902 Long term (current) use of antithrombotics/antiplatelets: Secondary | ICD-10-CM | POA: Diagnosis not present

## 2024-09-21 LAB — BASIC METABOLIC PANEL WITH GFR
Anion gap: 15 (ref 5–15)
BUN: 44 mg/dL — ABNORMAL HIGH (ref 8–23)
CO2: 22 mmol/L (ref 22–32)
Calcium: 8.8 mg/dL — ABNORMAL LOW (ref 8.9–10.3)
Chloride: 100 mmol/L (ref 98–111)
Creatinine, Ser: 1.55 mg/dL — ABNORMAL HIGH (ref 0.44–1.00)
GFR, Estimated: 32 mL/min — ABNORMAL LOW
Glucose, Bld: 141 mg/dL — ABNORMAL HIGH (ref 70–99)
Potassium: 4 mmol/L (ref 3.5–5.1)
Sodium: 137 mmol/L (ref 135–145)

## 2024-09-21 LAB — MAGNESIUM: Magnesium: 1.8 mg/dL (ref 1.7–2.4)

## 2024-09-21 LAB — CBC
HCT: 31.6 % — ABNORMAL LOW (ref 36.0–46.0)
Hemoglobin: 9.9 g/dL — ABNORMAL LOW (ref 12.0–15.0)
MCH: 28.5 pg (ref 26.0–34.0)
MCHC: 31.3 g/dL (ref 30.0–36.0)
MCV: 91.1 fL (ref 80.0–100.0)
Platelets: 176 K/uL (ref 150–400)
RBC: 3.47 MIL/uL — ABNORMAL LOW (ref 3.87–5.11)
RDW: 15 % (ref 11.5–15.5)
WBC: 18.2 K/uL — ABNORMAL HIGH (ref 4.0–10.5)
nRBC: 0.1 % (ref 0.0–0.2)

## 2024-09-21 MED ORDER — NIFEDIPINE ER OSMOTIC RELEASE 60 MG PO TB24
60.0000 mg | ORAL_TABLET | Freq: Every day | ORAL | Status: DC
  Administered 2024-09-21 – 2024-09-22 (×2): 60 mg via ORAL
  Filled 2024-09-21 (×2): qty 1

## 2024-09-21 MED ORDER — ISOSORBIDE MONONITRATE ER 30 MG PO TB24
30.0000 mg | ORAL_TABLET | Freq: Every day | ORAL | Status: DC
  Administered 2024-09-21 – 2024-09-30 (×10): 30 mg via ORAL
  Filled 2024-09-21 (×3): qty 1

## 2024-09-21 MED ORDER — HYALURONIDASE HUMAN 150 UNIT/ML IJ SOLN
150.0000 [IU] | Freq: Once | INTRAMUSCULAR | Status: AC
  Administered 2024-09-21: 150 [IU] via SUBCUTANEOUS
  Filled 2024-09-21: qty 1

## 2024-09-21 MED ORDER — LABETALOL HCL 5 MG/ML IV SOLN: 10.0000 mg | INTRAVENOUS | Status: DC | PRN

## 2024-09-21 MED ORDER — FUROSEMIDE 10 MG/ML IJ SOLN
40.0000 mg | Freq: Two times a day (BID) | INTRAMUSCULAR | Status: DC
  Filled 2024-09-21: qty 4

## 2024-09-21 MED ORDER — FUROSEMIDE 10 MG/ML IJ SOLN
40.0000 mg | Freq: Two times a day (BID) | INTRAMUSCULAR | Status: DC
  Administered 2024-09-21: 40 mg via INTRAVENOUS
  Filled 2024-09-21 (×2): qty 4

## 2024-09-21 MED ORDER — ALPRAZOLAM 0.25 MG PO TABS
0.2500 mg | ORAL_TABLET | Freq: Three times a day (TID) | ORAL | Status: DC | PRN
  Administered 2024-09-22 – 2024-09-25 (×6): 0.25 mg via ORAL
  Filled 2024-09-21: qty 1

## 2024-09-21 MED ORDER — HYDRALAZINE HCL 25 MG PO TABS: 25.0000 mg | ORAL_TABLET | Freq: Three times a day (TID) | ORAL | Status: DC

## 2024-09-21 MED ORDER — DAPAGLIFLOZIN PROPANEDIOL 10 MG PO TABS: 10.0000 mg | ORAL_TABLET | Freq: Every day | ORAL | Status: DC

## 2024-09-21 MED ORDER — FUROSEMIDE 10 MG/ML IJ SOLN
20.0000 mg | Freq: Once | INTRAMUSCULAR | Status: AC
  Administered 2024-09-21: 20 mg via INTRAVENOUS
  Filled 2024-09-21: qty 2

## 2024-09-21 MED ORDER — HYDRALAZINE HCL 20 MG/ML IJ SOLN: 10.0000 mg | INTRAMUSCULAR | Status: DC | PRN

## 2024-09-21 MED ORDER — CARVEDILOL 6.25 MG PO TABS: 6.2500 mg | ORAL_TABLET | Freq: Two times a day (BID) | ORAL | Status: DC

## 2024-09-21 NOTE — Progress Notes (Signed)
 [facility] Progress Note Patient: Alyssa Hudson FMW:[id number] DOB: 1941/09/20  DOA: 09/14/2024 DOS: the patient was seen and examined on 09/21/2024  Brief Hospital Course: Patient with PMH of aortic stenosis, chronic HFpEF, chronic A-fib, HTN presented to the hospital with cough and shortness of breath. Found to have pneumonia.  Initially admitted to [facility] TS service. Troponins were elevated.  Cardiology was consulted. Rapid response was called on 12/21. Underwent cardiac catheterization.  PCCM was consulted.  Patient was intubated on the vent. Now transferred out of the ICU or hospitalist service.  Assessment and Plan: Acute hypoxic respiratory failure. Secondary pneumonia as well as non-STEMI. Was intubated on 12/21. Extubated on 12/23. MRSA negative.  Cultures are so far negative. Treated with IV Zosyn .   Flash pulmonary edema. On night of 12/26 patient become short of breath.  Requiring nonrebreather.  Chest x-ray shows vascular congestion.  EKG unremarkable.  Likely flash pulm edema in the setting of hypertension. Medication have been adjusted.  Monitor.  Non-STEMI. Severe triple-vessel disease. Acute diastolic CHF. Status post PCI to DES to LAD. Cardiogenic shock. Shock currently resolved. Required pressors out of the ICU. Currently on DAPT. Currently on statin. Management per cardiology.  Paroxysmal A-fib. Currently being treated with amiodarone . On Eliquis  for anticoagulation.  Initial plan was for cardioversion but currently sinus rhythm.  Monitor. Monitor.  Hypertensive emergency.  With Blood pressure stable for now. For now monitor and continue current medication.  AKI on CKD 3B. Baseline creatinine appears to be around 1.4. Serum creatinine peaked at 2.56. Likely secondary to hemodynamic instability and shock. Currently improving back to around 1.6. Monitor.  Acute blood loss anemia. Patient had an injury and a fall a few days ago. Sustained  significant hematoma of the left leg. Hemoglobin dropped to 7.7. Transfuse for hemoglobin less than 8. Continue DAPT. Monitor H&H. Iron  deficiency noted on the workup.  B12 folic acid normal.  Proteus UTI. Currently on antibiotic.  Acute diastolic CHF. Received IV Lasix . Appreciate cardiology consultation. Will monitor volume status.  Subjective: Feeling better.  No nausea no vomiting.  Physical Exam: Basilar crackles.  Improving swelling of the leg.  Data Reviewed: I have Reviewed nursing notes, Vitals, and Lab results. Reviewed CBC and BMP. Reordered CBC and BMP.  Disposition: Status is: Inpatient Remains inpatient appropriate because: Monitor for improvement in rate control  apixaban  (ELIQUIS ) tablet 2.5 mg Start: 09/17/24 1615 apixaban  (ELIQUIS ) tablet 2.5 mg    Family Communication: Family at bedside Level of care: Progressive   Vitals:   09/21/24 0500 09/21/24 0700 09/21/24 1100 09/21/24 1630  BP: (!) 149/59 (!) 153/56 (!) 152/60 (!) 130/114  Pulse: 86 88 89 (!) 101  Resp: (!) 25 (!) 25 (!) 21   Temp: 97.7 F (36.5 C) 97.9 F (36.6 C)  97.7 F (36.5 C)  TempSrc: Axillary Oral  Oral  SpO2: 100% 100% 100% 96%  Weight: 73 kg     Height:         Author: Yetta Blanch, MD 09/21/2024 6:31 PM  Please look on [email or web address] to find out who is on call.

## 2024-09-21 NOTE — Plan of Care (Signed)

## 2024-09-21 NOTE — Progress Notes (Addendum)
 "   Progress Note  Patient Name: Alyssa Hudson Date of Encounter: 09/21/2024  Primary Cardiologist: Jennifer JONELLE Crape, MD  Subjective   Overnight, she developed acute respiratory distress with likely flash pulmonary edema.  After receiving IV Lasix  1 dose, she received significant improvement in her breathing and also put out significant amount of urine.  Inpatient Medications    Scheduled Meds:  ALPRAZolam   0.25 mg Oral Once   amiodarone   200 mg Oral BID   Followed by   NOREEN ON 09/24/2024] amiodarone   200 mg Oral Daily   apixaban   2.5 mg Oral BID   aspirin  EC  81 mg Oral Daily   atorvastatin   40 mg Oral Daily   Chlorhexidine  Gluconate Cloth  6 each Topical Daily   clopidogrel   75 mg Oral Q breakfast   dapagliflozin  propanediol  10 mg Oral Daily   feeding supplement  237 mL Oral BID BM   furosemide   40 mg Intravenous BID   isosorbide  mononitrate  30 mg Oral Daily   NIFEdipine   60 mg Oral Daily   pantoprazole   40 mg Oral QHS   sodium chloride  flush  3 mL Intravenous Q12H   Continuous Infusions:  PRN Meds: acetaminophen , diclofenac  Sodium, ipratropium-albuterol , labetalol , mouth rinse, sodium chloride  flush   Vital Signs    Vitals:   09/21/24 0040 09/21/24 0100 09/21/24 0500 09/21/24 0700  BP:  (!) 166/56 (!) 149/59 (!) 153/56  Pulse: 89 90 86 88  Resp: (!) 22 (!) 25 (!) 25 (!) 25  Temp:   97.7 F (36.5 C) 97.9 F (36.6 C)  TempSrc:   Axillary Oral  SpO2: 100% 100% 100% 100%  Weight:   73 kg   Height:        Intake/Output Summary (Last 24 hours) at 09/21/2024 0908 Last data filed at 09/20/2024 1721 Gross per 24 hour  Intake --  Output 800 ml  Net -800 ml   Filed Weights   09/19/24 0307 09/20/24 0500 09/21/24 0500  Weight: 71.2 kg 68.9 kg 73 kg    Telemetry     Personally reviewed.  In NSR.  ECG    Not performed today.  Physical Exam   GEN: No acute distress.   Neck: No JVD. Cardiac: RRR, no murmur, rub, or gallop.  Respiratory:  Nonlabored. Clear to auscultation bilaterally. GI: Soft, nontender, bowel sounds present. MS: No edema; No deformity. Neuro:  Nonfocal. Psych: Alert and oriented x 3. Normal affect.  Labs    Chemistry Recent Labs  Lab 09/14/24 1004 09/15/24 0510 09/17/24 0424 09/18/24 0941 09/19/24 0240 09/20/24 0228 09/21/24 0303  NA 132*   < > 138   < > 139 140 137  K 4.6   < > 4.0   < > 3.8 3.6 4.0  CL 96*   < > 100   < > 104 101 100  CO2 21*   < > 24   < > 25 26 22   GLUCOSE 143*   < > 139*   < > 124* 127* 141*  BUN 53*   < > 102*   < > 67* 49* 44*  CREATININE 1.90*   < > 2.56*   < > 1.61* 1.64* 1.55*  CALCIUM  8.6*   < > 8.4*   < > 8.4* 8.5* 8.8*  PROT 6.6  --  5.4*  --   --   --   --   ALBUMIN 3.8  --  3.1*  --   --   --   --  AST 24  --  17  --   --   --   --   ALT 10  --  15  --   --   --   --   ALKPHOS 85  --  61  --   --   --   --   BILITOT 0.8  --  0.3  --   --   --   --   GFRNONAA 25*   < > 18*   < > 31* 30* 32*  ANIONGAP 15   < > 15   < > 11 13 15    < > = values in this interval not displayed.     Hematology Recent Labs  Lab 09/19/24 1558 09/20/24 0228 09/21/24 0303  WBC 11.8* 10.1 18.2*  RBC 3.26* 3.06* 3.47*  HGB 9.3* 8.6* 9.9*  HCT 28.4* 26.8* 31.6*  MCV 87.1 87.6 91.1  MCH 28.5 28.1 28.5  MCHC 32.7 32.1 31.3  RDW 15.1 15.0 15.0  PLT 219 184 176    Cardiac EnzymesNo results for input(s): TROPONINIHS in the last 720 hours.  BNP Recent Labs  Lab 09/14/24 1004  PROBNP 18,334.0*     DDimerNo results for input(s): DDIMER in the last 168 hours.   Radiology    DG Chest Port 1 View Result Date: 09/20/2024 EXAM: 1 VIEW(S) XRAY OF THE CHEST 09/20/2024 11:29:00 PM COMPARISON: 09/17/2024 CLINICAL HISTORY: Acute respiratory distress FINDINGS: LINES, TUBES AND DEVICES: Endotracheal and enteric tubes removed. Left IJ CVC removed. LUNGS AND PLEURA: Small bilateral pleural effusions. Cephalization of pulmonary vasculature. Diffuse bilateral interstitial  prominence. Probable bibasilar atelectasis. No pneumothorax. HEART AND MEDIASTINUM: Cardiomegaly. Aortic atherosclerosis. BONES AND SOFT TISSUES: No acute osseous abnormality. IMPRESSION: 1. Cardiomegaly with mild pulmonary edema and small bilateral pleural effusions. Electronically signed by: Greig Pique MD 09/20/2024 11:43 PM EST RP Workstation: [id number]    Assessment & Plan   Acute on chronic diastolic heart failure - Overnight, patient developed acute respiratory distress secondary to likely flash pulmonary edema.  She received IV Lasix  40 mg 1 dose with significant urine output. - LVEDP 21 mmHg on LHC on 09/16/2023. - Start IV Lasix  40 mg twice daily, transition to p.o. Lasix  40 mg twice daily tomorrow AM.  She will need scheduled diuretics moving forward. - Not a candidate for SGLT2 inhibitors due to recent UTI.  HTN, poorly controlled - Blood pressure significantly elevated last night, 196/73 mmHg. - IV Lasix , as above.  She will need scheduled diuretics moving forward. - Amlodipine  discontinued due to leg swelling yesterday morning, no leg swelling on exam today.  On diltiazem  30 mg Q 6/8 hours, switched to nifedipine  60 mg once daily for adequate BP control. - Discontinue hydralazine , can uptitrate nifedipine  depending on BP control.  Shock, multifactorial, cardiogenic and septic - Resolved.  She was treated for pneumonia and UTI.  Off pressor support and ventilator.  CAD manifested by NSTEMI c/w cardiogenic shock - s/p proximal LAD PCI and proximal LCx PCI with residual RCA and diagonal disease treated medically.  Cardiogenic shock resolved. - Continue triple therapy with aspirin , Plavix  and Eliquis  for 30 days followed by Plavix  and Eliquis  for 1 year. - Continue atorvastatin  40 mg nightly. - Not a candidate for SGLT2 inhibitors due to recent UTI.  Atrial fibrillation, new onset - In and out of A-fib this hospitalization, currently in NSR. - Telemetry reviewed, in NSR. -  Continue p.o. amiodarone  200 mg once daily (previously on p.o. amiodarone  200 mg twice  daily for 7 days). - Continue Eliquis  2.5 mg twice daily.  AKI on CKD - Resolved.  Baseline serum creatinine 1.5.  40 minutes spent in reviewing prior medical records, reports, more than 3 labs, discussion and documentation.  I spoke with the son over the phone and updated him about the above plan.   Signed, Diannah SHAUNNA Maywood, MD  09/21/2024, 9:08 AM    "

## 2024-09-22 DIAGNOSIS — I214 Non-ST elevation (NSTEMI) myocardial infarction: Secondary | ICD-10-CM | POA: Diagnosis not present

## 2024-09-22 DIAGNOSIS — R0603 Acute respiratory distress: Secondary | ICD-10-CM | POA: Diagnosis not present

## 2024-09-22 DIAGNOSIS — I10 Essential (primary) hypertension: Secondary | ICD-10-CM | POA: Diagnosis not present

## 2024-09-22 DIAGNOSIS — I2510 Atherosclerotic heart disease of native coronary artery without angina pectoris: Secondary | ICD-10-CM | POA: Diagnosis not present

## 2024-09-22 DIAGNOSIS — I5033 Acute on chronic diastolic (congestive) heart failure: Secondary | ICD-10-CM | POA: Diagnosis not present

## 2024-09-22 LAB — CBC WITH DIFFERENTIAL/PLATELET
Basophils Absolute: 0 K/uL (ref 0.0–0.1)
Basophils Relative: 0 %
Eosinophils Absolute: 0 K/uL (ref 0.0–0.5)
Eosinophils Relative: 0 %
HCT: 26.4 % — ABNORMAL LOW (ref 36.0–46.0)
Hemoglobin: 8.4 g/dL — ABNORMAL LOW (ref 12.0–15.0)
Lymphocytes Relative: 23 %
Lymphs Abs: 3.1 K/uL (ref 0.7–4.0)
MCH: 28.1 pg (ref 26.0–34.0)
MCHC: 31.8 g/dL (ref 30.0–36.0)
MCV: 88.3 fL (ref 80.0–100.0)
Monocytes Absolute: 1.5 K/uL — ABNORMAL HIGH (ref 0.1–1.0)
Monocytes Relative: 11 %
Neutro Abs: 8.8 K/uL — ABNORMAL HIGH (ref 1.7–7.7)
Neutrophils Relative %: 66 %
Platelets: 168 K/uL (ref 150–400)
RBC: 2.99 MIL/uL — ABNORMAL LOW (ref 3.87–5.11)
RDW: 15 % (ref 11.5–15.5)
WBC: 13.4 K/uL — ABNORMAL HIGH (ref 4.0–10.5)
nRBC: 0 % (ref 0.0–0.2)

## 2024-09-22 LAB — MAGNESIUM: Magnesium: 1.7 mg/dL (ref 1.7–2.4)

## 2024-09-22 LAB — BASIC METABOLIC PANEL WITH GFR
Anion gap: 12 (ref 5–15)
BUN: 42 mg/dL — ABNORMAL HIGH (ref 8–23)
CO2: 27 mmol/L (ref 22–32)
Calcium: 8.7 mg/dL — ABNORMAL LOW (ref 8.9–10.3)
Chloride: 99 mmol/L (ref 98–111)
Creatinine, Ser: 1.64 mg/dL — ABNORMAL HIGH (ref 0.44–1.00)
GFR, Estimated: 30 mL/min — ABNORMAL LOW
Glucose, Bld: 137 mg/dL — ABNORMAL HIGH (ref 70–99)
Potassium: 3.6 mmol/L (ref 3.5–5.1)
Sodium: 138 mmol/L (ref 135–145)

## 2024-09-22 LAB — GLUCOSE, CAPILLARY: Glucose-Capillary: 151 mg/dL — ABNORMAL HIGH (ref 70–99)

## 2024-09-22 MED ORDER — NIFEDIPINE ER OSMOTIC RELEASE 30 MG PO TB24
30.0000 mg | ORAL_TABLET | Freq: Once | ORAL | Status: AC
  Administered 2024-09-22: 30 mg via ORAL
  Filled 2024-09-22: qty 1

## 2024-09-22 MED ORDER — POTASSIUM CHLORIDE CRYS ER 20 MEQ PO TBCR
40.0000 meq | EXTENDED_RELEASE_TABLET | Freq: Once | ORAL | Status: AC
  Administered 2024-09-22: 40 meq via ORAL
  Filled 2024-09-22: qty 2

## 2024-09-22 MED ORDER — NIFEDIPINE ER OSMOTIC RELEASE 60 MG PO TB24: 90.0000 mg | ORAL_TABLET | Freq: Every day | ORAL | Status: DC

## 2024-09-22 MED ORDER — FUROSEMIDE 40 MG PO TABS
40.0000 mg | ORAL_TABLET | Freq: Two times a day (BID) | ORAL | Status: DC
  Administered 2024-09-22: 40 mg via ORAL
  Filled 2024-09-22: qty 1

## 2024-09-22 MED ORDER — MAGNESIUM SULFATE 2 GM/50ML IV SOLN
2.0000 g | Freq: Once | INTRAVENOUS | Status: AC
  Administered 2024-09-22: 2 g via INTRAVENOUS
  Filled 2024-09-22: qty 50

## 2024-09-22 MED ORDER — IRON SUCROSE 200 MG IVPB - SIMPLE MED
200.0000 mg | Freq: Once | Status: AC
  Administered 2024-09-23: 200 mg via INTRAVENOUS
  Filled 2024-09-22: qty 110

## 2024-09-22 MED ORDER — FUROSEMIDE 40 MG PO TABS
40.0000 mg | ORAL_TABLET | Freq: Every day | ORAL | Status: DC
  Administered 2024-09-22: 40 mg via ORAL
  Filled 2024-09-22: qty 1

## 2024-09-22 MED ORDER — BENZONATATE 100 MG PO CAPS
100.0000 mg | ORAL_CAPSULE | Freq: Three times a day (TID) | ORAL | Status: DC
  Administered 2024-09-22 – 2024-09-30 (×24): 100 mg via ORAL
  Filled 2024-09-22 (×4): qty 1

## 2024-09-22 MED ORDER — GUAIFENESIN ER 600 MG PO TB12
600.0000 mg | ORAL_TABLET | Freq: Two times a day (BID) | ORAL | Status: DC
  Administered 2024-09-22 – 2024-09-30 (×16): 600 mg via ORAL
  Filled 2024-09-22 (×3): qty 1

## 2024-09-22 NOTE — Plan of Care (Signed)
  Problem: Education: Goal: Knowledge of General Education information will improve Description: Including pain rating scale, medication(s)/side effects and non-pharmacologic comfort measures Outcome: Progressing   Problem: Clinical Measurements: Goal: Will remain free from infection Outcome: Progressing Goal: Diagnostic test results will improve Outcome: Progressing   Problem: Activity: Goal: Risk for activity intolerance will decrease Outcome: Progressing   Problem: Coping: Goal: Level of anxiety will decrease Outcome: Progressing   

## 2024-09-22 NOTE — Hospital Course (Signed)
 Patient with PMH of aortic stenosis, chronic HFpEF, chronic A-fib, HTN presented to the hospital with cough and shortness of breath. Found to have pneumonia.  Initially admitted to [facility] TS service. Troponins were elevated.  Cardiology was consulted. Rapid response was called on 12/21. Underwent cardiac catheterization.  PCCM was consulted.  Patient was intubated on the vent. Now transferred out of the ICU or hospitalist service. Assessment and Plan: Acute hypoxic respiratory failure. Secondary pneumonia as well as non-STEMI. Was intubated on 12/21. Extubated on 12/23. MRSA negative.  Cultures are so far negative. Treated with IV Zosyn .  While the oxygenation is improving patient remains persistently tachypneic. It appears that patient's chest x-ray shows worsening pleural effusion and therefore we will perform a CT of the chest. IR consulted for thoracentesis as well.   Flash pulmonary edema. Acute diastolic CHF. Received IV Lasix . Appreciate cardiology consultation.  Currently signed off. Developed severe respiratory stress on 12/26 requiring BiPAP therapy again. Blood pressure medication adjusted.  Given worsening renal function Lasix  on hold although remains with pleural effusion and therefore performing thoracentesis. Monitor.   Non-STEMI. Severe triple-vessel disease. Acute diastolic CHF. Status post PCI to DES to LAD. Cardiogenic shock. Shock currently resolved. Required pressors out of the ICU. Currently on DAPT. Currently on statin. Management per cardiology.   Paroxysmal A-fib. Currently being treated with amiodarone . On Eliquis  for anticoagulation.  Initial plan was for cardioversion but currently sinus rhythm.  Monitor. Per cardiology patient is on nifedipine .  Monitor.   Hypertensive emergency. Better blood pressure still remains elevated. Cardiology switched her to nifedipine  XL. Lasix  held on 12/29 for worsening renal function.   AKI on CKD 3B. Baseline  creatinine appears to be around 1.4. Serum creatinine peaked at 2.56. Likely secondary to hemodynamic instability and shock. Currently improving back to around 1.6. Monitor.   Acute blood loss anemia. Patient had an injury and a fall a few days ago. Sustained significant hematoma of the left leg. Hemoglobin dropped to 7.7. Transfuse for hemoglobin less than 8. Continue DAPT. Monitor H&H. Iron  deficiency noted on the workup.  Given IV iron . B12 folic acid normal.   Proteus UTI. Currently on antibiotic.

## 2024-09-22 NOTE — Progress Notes (Signed)
 [facility] Progress Note Patient: Alyssa Hudson FMW:[id number] DOB: [birth year]  DOA: 09/14/2024 DOS: the patient was seen and examined on 09/22/2024  Brief Hospital Course: Patient with PMH of aortic stenosis, chronic HFpEF, chronic A-fib, HTN presented to the hospital with cough and shortness of breath. Found to have pneumonia.  Initially admitted to [facility] TS service. Troponins were elevated.  Cardiology was consulted. Rapid response was called on 12/21. Underwent cardiac catheterization.  PCCM was consulted.  Patient was intubated on the vent. Now transferred out of the ICU or hospitalist service. Assessment and Plan: Acute hypoxic respiratory failure. Secondary pneumonia as well as non-STEMI. Was intubated on 12/21. Extubated on 12/23. MRSA negative.  Cultures are so far negative. Treated with IV Zosyn .    Flash pulmonary edema. Acute diastolic CHF. Received IV Lasix . Appreciate cardiology consultation.  Currently signed off. Developed severe respiratory stress on 12/26 requiring BiPAP therapy again. Blood pressure medication adjusted.  Monitor.   Non-STEMI. Severe triple-vessel disease. Acute diastolic CHF. Status post PCI to DES to LAD. Cardiogenic shock. Shock currently resolved. Required pressors out of the ICU. Currently on DAPT. Currently on statin. Management per cardiology.   Paroxysmal A-fib. Currently being treated with amiodarone . On Eliquis  for anticoagulation.  Initial plan was for cardioversion but currently sinus rhythm.  Monitor. Per cardiology patient is on nifedipine .  Monitor.   Hypertensive emergency. Better blood pressure still remains elevated. Currently on Lasix  40 mg daily.  Increase to 40 mg twice daily. On MDR. Lethargic and oriented to his hydralazine . Cardiology switched her to nifedipine  XL.   AKI on CKD 3B. Baseline creatinine appears to be around 1.4. Serum creatinine peaked at 2.56. Likely secondary to hemodynamic instability  and shock. Currently improving back to around 1.6. Monitor.   Acute blood loss anemia. Patient had an injury and a fall a few days ago. Sustained significant hematoma of the left leg. Hemoglobin dropped to 7.7. Transfuse for hemoglobin less than 8. Continue DAPT. Monitor H&H. Iron  deficiency noted on the workup.  Will provide IV iron  tomorrow.  B12 folic acid normal.   Proteus UTI. Currently on antibiotic.   Subjective: No nausea no vomiting no fever no chills.  Still severely short of breath.  No chest pain.  Still has some cough I provided incentive spirometry.  Physical Exam: Basal crackles. S1-S2 present Bowel sounds normal Left leg swelling seen.  Improving of the left knee.  Data Reviewed: I have Reviewed nursing notes, Vitals, and Lab results. Since last encounter, pertinent lab results CBC and BMP   . I have ordered test including BMP and CBC  . I have discussed pt's care plan and test results with cardiology  .   Disposition: Status is: Inpatient Remains inpatient appropriate because: Monitor for improvement in electrolytes and blood pressure  apixaban  (ELIQUIS ) tablet 2.5 mg Start: 09/17/24 1615 apixaban  (ELIQUIS ) tablet 2.5 mg    Family Communication: Family at bedside Level of care: Progressive   Vitals:   09/22/24 0700 09/22/24 0914 09/22/24 1216 09/22/24 1654  BP: (!) 151/51  111/81 107/85  Pulse: 81  90 90  Resp: (!) 27  20 20   Temp: 97.8 F (36.6 C)  98 F (36.7 C) 98.2 F (36.8 C)  TempSrc: Oral  Oral Oral  SpO2: 100% 100% 100% 100%  Weight:      Height:         Author: Yetta Blanch, MD 09/22/2024 6:11 PM  Please look on [email or web address] to find out who is on call.

## 2024-09-22 NOTE — Progress Notes (Signed)
 "   Progress Note  Patient Name: Alyssa Hudson Date of Encounter: 09/22/2024  Primary Cardiologist: Jennifer JONELLE Crape, MD  Subjective   Doing a lot better.  Inpatient Medications    Scheduled Meds:  amiodarone   200 mg Oral BID   Followed by   NOREEN ON 09/24/2024] amiodarone   200 mg Oral Daily   apixaban   2.5 mg Oral BID   aspirin  EC  81 mg Oral Daily   atorvastatin   40 mg Oral Daily   clopidogrel   75 mg Oral Q breakfast   feeding supplement  237 mL Oral BID BM   furosemide   40 mg Oral Daily   isosorbide  mononitrate  30 mg Oral Daily   NIFEdipine   60 mg Oral Daily   pantoprazole   40 mg Oral QHS   sodium chloride  flush  3 mL Intravenous Q12H   Continuous Infusions:  PRN Meds: acetaminophen , ALPRAZolam , hydrALAZINE , ipratropium-albuterol , labetalol , mouth rinse, sodium chloride  flush   Vital Signs    Vitals:   09/21/24 2300 09/22/24 0400 09/22/24 0500 09/22/24 0700  BP: (!) 149/51 (!) 147/56  (!) 151/51  Pulse: 67 81  81  Resp: (!) 29 (!) 26  (!) 27  Temp: 98.6 F (37 C) 98.2 F (36.8 C)  97.8 F (36.6 C)  TempSrc: Axillary Axillary  Oral  SpO2: 100% 100%  100%  Weight:   68.7 kg   Height:        Intake/Output Summary (Last 24 hours) at 09/22/2024 0908 Last data filed at 09/21/2024 2344 Gross per 24 hour  Intake --  Output 1350 ml  Net -1350 ml   Filed Weights   09/20/24 0500 09/21/24 0500 09/22/24 0500  Weight: 68.9 kg 73 kg 68.7 kg    Telemetry     Personally reviewed.  In NSR.  ECG    Not performed today.  Physical Exam   GEN: No acute distress.   Neck: No JVD. Cardiac: RRR, no murmur, rub, or gallop.  Respiratory: Nonlabored. Clear to auscultation bilaterally. GI: Soft, nontender, bowel sounds present. MS: No edema; No deformity. Neuro:  Nonfocal. Psych: Alert and oriented x 3. Normal affect.  Labs    Chemistry Recent Labs  Lab 09/17/24 0424 09/18/24 0941 09/20/24 0228 09/21/24 0303 09/22/24 0146  NA 138   < > 140 137 138   K 4.0   < > 3.6 4.0 3.6  CL 100   < > 101 100 99  CO2 24   < > 26 22 27   GLUCOSE 139*   < > 127* 141* 137*  BUN 102*   < > 49* 44* 42*  CREATININE 2.56*   < > 1.64* 1.55* 1.64*  CALCIUM  8.4*   < > 8.5* 8.8* 8.7*  PROT 5.4*  --   --   --   --   ALBUMIN 3.1*  --   --   --   --   AST 17  --   --   --   --   ALT 15  --   --   --   --   ALKPHOS 61  --   --   --   --   BILITOT 0.3  --   --   --   --   GFRNONAA 18*   < > 30* 32* 30*  ANIONGAP 15   < > 13 15 12    < > = values in this interval not displayed.     Hematology Recent Labs  Lab 09/20/24  9771 09/21/24 0303 09/22/24 0146  WBC 10.1 18.2* 13.4*  RBC 3.06* 3.47* 2.99*  HGB 8.6* 9.9* 8.4*  HCT 26.8* 31.6* 26.4*  MCV 87.6 91.1 88.3  MCH 28.1 28.5 28.1  MCHC 32.1 31.3 31.8  RDW 15.0 15.0 15.0  PLT 184 176 168    Cardiac EnzymesNo results for input(s): TROPONINIHS in the last 720 hours.  BNP No results for input(s): BNP, PROBNP in the last 168 hours.    DDimerNo results for input(s): DDIMER in the last 168 hours.   Radiology    DG Chest Port 1 View Result Date: 09/20/2024 EXAM: 1 VIEW(S) XRAY OF THE CHEST 09/20/2024 11:29:00 PM COMPARISON: 09/17/2024 CLINICAL HISTORY: Acute respiratory distress FINDINGS: LINES, TUBES AND DEVICES: Endotracheal and enteric tubes removed. Left IJ CVC removed. LUNGS AND PLEURA: Small bilateral pleural effusions. Cephalization of pulmonary vasculature. Diffuse bilateral interstitial prominence. Probable bibasilar atelectasis. No pneumothorax. HEART AND MEDIASTINUM: Cardiomegaly. Aortic atherosclerosis. BONES AND SOFT TISSUES: No acute osseous abnormality. IMPRESSION: 1. Cardiomegaly with mild pulmonary edema and small bilateral pleural effusions. Electronically signed by: Greig Pique MD 09/20/2024 11:43 PM EST RP Workstation: [id number]    Assessment & Plan   Acute on chronic diastolic heart failure - 1.8 L urine output on IV Lasix  40 mg twice daily in the last 24 hours with net  -1.8 L.  Doing a lot better.  No SOB.  Switch to p.o. Lasix  40 mg once daily. - LVEDP 21 mmHg on LHC on 09/16/2023. - Not a candidate for SGLT2 inhibitors due to recent UTI.  HTN, partially controlled -Switch IV Lasix  to p.o. Lasix  40 mg once daily. - Amlodipine  discontinued due to leg swelling yesterday morning, no leg swelling on exam today.  On diltiazem  30 mg Q 6/8 hours, switched to nifedipine  60 mg once daily for adequate BP control. - Discontinued hydralazine , can uptitrate nifedipine  depending on BP control.  Shock, multifactorial, cardiogenic and septic - Resolved.  She was treated for pneumonia and UTI.  Off pressor support and ventilator.  CAD manifested by NSTEMI c/w cardiogenic shock - s/p proximal LAD PCI and proximal LCx PCI with residual RCA and diagonal disease treated medically.  Cardiogenic shock resolved. - Continue triple therapy with aspirin , Plavix  and Eliquis  for 30 days followed by Plavix  and Eliquis  for 1 year. - Continue atorvastatin  40 mg nightly. - Not a candidate for SGLT2 inhibitors due to recent UTI.  Atrial fibrillation, new onset - In and out of A-fib this hospitalization, currently in NSR. - Telemetry reviewed, in NSR. - Continue p.o. amiodarone  200 mg once daily (previously on p.o. amiodarone  200 mg twice daily for 7 days). - Continue Eliquis  2.5 mg twice daily.  AKI on CKD - Resolved.  Baseline serum creatinine 1.5.  [facility] will sign off.   Medication Recommendations: Continue current medications Other recommendations (labs, testing, etc): None Follow up as an outpatient: Keep appointment with cardiology, Dr. Revankar on 10/03/2024.   40 minutes spent in reviewing prior medical records, reports, more than 3 labs, discussion and documentation.  I spoke with the son over the phone and updated him about the above plan.   Signed, Diannah SHAUNNA Maywood, MD  09/22/2024, 9:08 AM    "

## 2024-09-23 ENCOUNTER — Inpatient Hospital Stay (HOSPITAL_COMMUNITY)

## 2024-09-23 DIAGNOSIS — I10 Essential (primary) hypertension: Secondary | ICD-10-CM

## 2024-09-23 DIAGNOSIS — I214 Non-ST elevation (NSTEMI) myocardial infarction: Principal | ICD-10-CM

## 2024-09-23 DIAGNOSIS — I2510 Atherosclerotic heart disease of native coronary artery without angina pectoris: Secondary | ICD-10-CM | POA: Diagnosis not present

## 2024-09-23 DIAGNOSIS — N179 Acute kidney failure, unspecified: Secondary | ICD-10-CM | POA: Diagnosis not present

## 2024-09-23 DIAGNOSIS — I480 Paroxysmal atrial fibrillation: Secondary | ICD-10-CM | POA: Diagnosis not present

## 2024-09-23 DIAGNOSIS — R0603 Acute respiratory distress: Secondary | ICD-10-CM | POA: Diagnosis not present

## 2024-09-23 DIAGNOSIS — I5033 Acute on chronic diastolic (congestive) heart failure: Secondary | ICD-10-CM

## 2024-09-23 DIAGNOSIS — Z7902 Long term (current) use of antithrombotics/antiplatelets: Secondary | ICD-10-CM | POA: Diagnosis not present

## 2024-09-23 LAB — CBC WITH DIFFERENTIAL/PLATELET
Abs Immature Granulocytes: 1.7 K/uL — ABNORMAL HIGH (ref 0.00–0.07)
Basophils Absolute: 0.1 K/uL (ref 0.0–0.1)
Basophils Relative: 0 %
Eosinophils Absolute: 0 K/uL (ref 0.0–0.5)
Eosinophils Relative: 0 %
HCT: 26.3 % — ABNORMAL LOW (ref 36.0–46.0)
Hemoglobin: 8.3 g/dL — ABNORMAL LOW (ref 12.0–15.0)
Immature Granulocytes: 10 %
Lymphocytes Relative: 12 %
Lymphs Abs: 2.2 K/uL (ref 0.7–4.0)
MCH: 28.1 pg (ref 26.0–34.0)
MCHC: 31.6 g/dL (ref 30.0–36.0)
MCV: 89.2 fL (ref 80.0–100.0)
Monocytes Absolute: 2.8 K/uL — ABNORMAL HIGH (ref 0.1–1.0)
Monocytes Relative: 16 %
Neutro Abs: 11.1 K/uL — ABNORMAL HIGH (ref 1.7–7.7)
Neutrophils Relative %: 62 %
Platelets: 196 K/uL (ref 150–400)
RBC: 2.95 MIL/uL — ABNORMAL LOW (ref 3.87–5.11)
RDW: 14.8 % (ref 11.5–15.5)
Smear Review: NORMAL
WBC: 17.8 K/uL — ABNORMAL HIGH (ref 4.0–10.5)
nRBC: 0 % (ref 0.0–0.2)

## 2024-09-23 LAB — BASIC METABOLIC PANEL WITH GFR
Anion gap: 13 (ref 5–15)
BUN: 45 mg/dL — ABNORMAL HIGH (ref 8–23)
CO2: 26 mmol/L (ref 22–32)
Calcium: 8.9 mg/dL (ref 8.9–10.3)
Chloride: 96 mmol/L — ABNORMAL LOW (ref 98–111)
Creatinine, Ser: 1.83 mg/dL — ABNORMAL HIGH (ref 0.44–1.00)
GFR, Estimated: 27 mL/min — ABNORMAL LOW
Glucose, Bld: 150 mg/dL — ABNORMAL HIGH (ref 70–99)
Potassium: 4.2 mmol/L (ref 3.5–5.1)
Sodium: 135 mmol/L (ref 135–145)

## 2024-09-23 LAB — C-REACTIVE PROTEIN: CRP: 6 mg/dL — ABNORMAL HIGH

## 2024-09-23 LAB — PROCALCITONIN: Procalcitonin: 0.25 ng/mL

## 2024-09-23 LAB — SEDIMENTATION RATE: Sed Rate: 80 mm/h — ABNORMAL HIGH (ref 0–22)

## 2024-09-23 LAB — LACTATE DEHYDROGENASE: LDH: 232 U/L (ref 105–235)

## 2024-09-23 LAB — PRO BRAIN NATRIURETIC PEPTIDE: Pro Brain Natriuretic Peptide: >35000 pg/mL — ABNORMAL HIGH

## 2024-09-23 MED ORDER — ONDANSETRON HCL 4 MG/2ML IJ SOLN: 4.0000 mg | Freq: Four times a day (QID) | INTRAMUSCULAR | Status: DC | PRN

## 2024-09-23 MED ORDER — NIFEDIPINE ER OSMOTIC RELEASE 60 MG PO TB24
60.0000 mg | ORAL_TABLET | Freq: Every day | ORAL | Status: DC
  Administered 2024-09-23 – 2024-09-25 (×3): 60 mg via ORAL
  Filled 2024-09-23 (×3): qty 1

## 2024-09-23 NOTE — Progress Notes (Signed)
 [facility] Progress Note Patient: Alyssa Hudson FMW:[id number] DOB: March 28, 1941  DOA: 09/14/2024 DOS: the patient was seen and examined on 09/23/2024  Brief Hospital Course: Patient with PMH of aortic stenosis, chronic HFpEF, chronic A-fib, HTN presented to the hospital with cough and shortness of breath. Found to have pneumonia.  Initially admitted to [facility] TS service. Troponins were elevated.  Cardiology was consulted. Rapid response was called on 12/21. Underwent cardiac catheterization.  PCCM was consulted.  Patient was intubated on the vent. Now transferred out of the ICU or hospitalist service. Assessment and Plan: Acute hypoxic respiratory failure. Secondary pneumonia as well as non-STEMI. Was intubated on 12/21. Extubated on 12/23. MRSA negative.  Cultures are so far negative. Treated with IV Zosyn .  While the oxygenation is improving patient remains persistently tachypneic. It appears that patient's chest x-ray shows worsening pleural effusion and therefore we will perform a CT of the chest. IR consulted for thoracentesis as well.   Flash pulmonary edema. Acute diastolic CHF. Received IV Lasix . Appreciate cardiology consultation.  Currently signed off. Developed severe respiratory stress on 12/26 requiring BiPAP therapy again. Blood pressure medication adjusted.  Given worsening renal function Lasix  on hold although remains with pleural effusion and therefore performing thoracentesis. Monitor.   Non-STEMI. Severe triple-vessel disease. Acute diastolic CHF. Status post PCI to DES to LAD. Cardiogenic shock. Shock currently resolved. Required pressors out of the ICU. Currently on DAPT. Currently on statin. Management per cardiology.   Paroxysmal A-fib. Currently being treated with amiodarone . On Eliquis  for anticoagulation.  Initial plan was for cardioversion but currently sinus rhythm.  Monitor. Per cardiology patient is on nifedipine .  Monitor.   Hypertensive  emergency. Better blood pressure still remains elevated. Cardiology switched her to nifedipine  XL. Lasix  held on 12/29 for worsening renal function.   AKI on CKD 3B. Baseline creatinine appears to be around 1.4. Serum creatinine peaked at 2.56. Likely secondary to hemodynamic instability and shock. Currently improving back to around 1.6. Monitor.   Acute blood loss anemia. Patient had an injury and a fall a few days ago. Sustained significant hematoma of the left leg. Hemoglobin dropped to 7.7. Transfuse for hemoglobin less than 8. Continue DAPT. Monitor H&H. Iron  deficiency noted on the workup.  Given IV iron . B12 folic acid normal.   Proteus UTI. Currently on antibiotic.   Subjective: No nausea no vomiting.  Patient still remains tachypneic.  Denies any chest pain.  Still has cough.  Physical Exam: Bilateral expiratory wheezes. S1-S2 present Bilateral basilar crackles. Bowel sound present No edema of the lower EXTR lower extremity.  Data Reviewed: I have Reviewed nursing notes, Vitals, and Lab results. Since last encounter, pertinent lab results CBC and BMP   . I have ordered test including CBC and BMP  . I have ordered imaging CT chest  .  Also ordered thoracentesis Disposition: Status is: Inpatient Remains inpatient appropriate because: Monitor for improvement in respiratory status.  SNF is currently being arranged.  apixaban  (ELIQUIS ) tablet 2.5 mg Start: 09/17/24 1615 apixaban  (ELIQUIS ) tablet 2.5 mg   Family Communication: Discussed with son on the phone. Level of care: Progressive   Vitals:   09/23/24 0749 09/23/24 1115 09/23/24 1643 09/23/24 1939  BP: 117/60 118/64 (!) 103/50 (!) 139/52  Pulse: 93 92 91 93  Resp: (!) 30 (!) 26 (!) 35 (!) 31  Temp: 97.9 F (36.6 C) 97.8 F (36.6 C) 97.8 F (36.6 C) 98.4 F (36.9 C)  TempSrc: Oral Oral Oral Oral  SpO2: 99% 100% 98%  98%  Weight:      Height:         Author: Yetta Blanch, MD 09/23/2024 7:48  PM  Please look on [email or web address] to find out who is on call.

## 2024-09-23 NOTE — NC FL2 (Signed)
 " [place]  MEDICAID FL2 LEVEL OF CARE FORM     IDENTIFICATION  Patient Name: Alyssa Hudson Birthdate: Jan 11, 1941 Sex: female Admission Date (Current Location): 09/14/2024  [facility] and [place] Number:  [employer] and Address:  [facility], [address] [place], [place] [address]      Provider Number: [id number]  Attending Physician Name and Address:  Tobie Yetta HERO, MD  Relative Name and Phone Number:  Amaya Blakeman; Spouse; [phone]    Current Level of Care: Hospital Recommended Level of Care: Skilled Nursing Facility Prior Approval Number:    Date Approved/Denied:   PASRR Number: [id number]  Discharge Plan: SNF    Current Diagnoses: Patient Active Problem List   Diagnosis Date Noted   Atherosclerosis of native coronary artery of native heart without angina pectoris 09/20/2024   Status post coronary artery stent placement 09/20/2024   Acute respiratory failure with hypoxia (HCC) 09/20/2024   CHF exacerbation (HCC) 09/15/2024   Non-ST elevation (NSTEMI) myocardial infarction (HCC) 09/15/2024   Respiratory distress 09/14/2024   Chronic health problem 09/14/2024   Sepsis (HCC) 09/14/2024   Chest pain 09/14/2024   Pneumonia 09/14/2024   AKI (acute kidney injury) 09/14/2024   BMI 33.0-33.9,adult 05/21/2021   Cervical radiculopathy due to osteoarthritis of spine 05/21/2021   Diverticulosis 05/21/2021   Generalized osteoarthritis of multiple sites 05/21/2021   Tricompartment osteoarthritis of right knee 05/21/2021   Hypertension    Facial droop    Carpal tunnel syndrome, right 08/13/2020   Cervical radiculopathy 08/13/2020   Ulnar neuropathy at elbow, right 08/13/2020   Atrial fibrillation (HCC) 07/24/2020   Acute pericarditis 07/24/2020   Primary hypertension 07/24/2020   Viral pericarditis with pericardial effusion 06/30/2020   Acute diastolic heart failure (HCC) 06/30/2020   Hyponatremia 06/20/2020    Encephalopathy 06/19/2020   Aortic valve sclerosis 2021   Aftercare following surgery 06/09/2016    Orientation RESPIRATION BLADDER Height & Weight     Self, Time, Situation, Place  O2 (1L nasal cannula) Continent Weight: 151 lb 3.8 oz (68.6 kg) Height:  5' 6 (167.6 cm)  BEHAVIORAL SYMPTOMS/MOOD NEUROLOGICAL BOWEL NUTRITION STATUS      Continent Diet (Please see discharge summary)  AMBULATORY STATUS COMMUNICATION OF NEEDS Skin   Extensive Assist Verbally Normal                       Personal Care Assistance Level of Assistance  Bathing, Feeding, Dressing Bathing Assistance: Maximum assistance Feeding assistance: Maximum assistance Dressing Assistance: Maximum assistance     Functional Limitations Info  Hearing   Hearing Info: Impaired (R and L)      SPECIAL CARE FACTORS FREQUENCY  PT (By licensed PT), OT (By licensed OT)     PT Frequency: 5x OT Frequency: 5x            Contractures Contractures Info: Not present    Additional Factors Info  Code Status, Allergies Code Status Info: DNR PRE-ARREST INTERVENTIONS DESIRED Allergies Info: Codeine; Contrast Media (iodinated Contrast Media); Iodine-131; Sulfa Antibiotics           Current Medications (09/23/2024):  This is the current hospital active medication list Current Facility-Administered Medications  Medication Dose Route Frequency Provider Last Rate Last Admin   acetaminophen  (TYLENOL ) tablet 650 mg  650 mg Oral Q6H PRN Cooper, Michael, MD   650 mg at 09/22/24 2039   ALPRAZolam  (XANAX ) tablet 0.25 mg  0.25 mg Oral TID PRN Patel, Pranav M,  MD   0.25 mg at 09/23/24 0801   [START ON 09/24/2024] amiodarone  (PACERONE ) tablet 200 mg  200 mg Oral Daily Tolia, Sunit, DO       apixaban  (ELIQUIS ) tablet 2.5 mg  2.5 mg Oral BID Colletta Manuelita Garre, PA-C   2.5 mg at 09/23/24 1006   aspirin  EC tablet 81 mg  81 mg Oral Daily Patel, Pranav M, MD   81 mg at 09/23/24 1006   atorvastatin  (LIPITOR) tablet 40 mg  40 mg  Oral Daily Chand, Sudham, MD   40 mg at 09/23/24 1006   benzonatate  (TESSALON ) capsule 100 mg  100 mg Oral TID Patel, Pranav M, MD   100 mg at 09/23/24 1006   clopidogrel  (PLAVIX ) tablet 75 mg  75 mg Oral Q breakfast Claudene Toribio BROCKS, MD   75 mg at 09/23/24 1006   feeding supplement (ENSURE PLUS HIGH PROTEIN) liquid 237 mL  237 mL Oral BID BM Harold Scholz, MD   237 mL at 09/23/24 1006   guaiFENesin  (MUCINEX ) 12 hr tablet 600 mg  600 mg Oral BID Patel, Pranav M, MD   600 mg at 09/23/24 1008   hydrALAZINE  (APRESOLINE ) injection 10 mg  10 mg Intravenous Q4H PRN Patel, Pranav M, MD       ipratropium-albuterol  (DUONEB) 0.5-2.5 (3) MG/3ML nebulizer solution 3 mL  3 mL Nebulization Q4H PRN Wonda Sharper, MD   3 mL at 09/23/24 0857   isosorbide  mononitrate (IMDUR ) 24 hr tablet 30 mg  30 mg Oral Daily Patel, Pranav M, MD   30 mg at 09/23/24 1006   labetalol  (NORMODYNE ) injection 10 mg  10 mg Intravenous Q2H PRN Otelia Gillian HERO, MD       NIFEdipine  (PROCARDIA  XL/NIFEDICAL XL) 24 hr tablet 60 mg  60 mg Oral Daily Patel, Pranav M, MD   60 mg at 09/23/24 1006   ondansetron  (ZOFRAN ) injection 4 mg  4 mg Intravenous Q6H PRN Patel, Pranav M, MD       Oral care mouth rinse  15 mL Mouth Rinse PRN Claudene Toribio BROCKS, MD       pantoprazole  (PROTONIX ) EC tablet 40 mg  40 mg Oral QHS Gretel Prentice BIRCH, RPH   40 mg at 09/22/24 2040   sodium chloride  flush (NS) 0.9 % injection 3 mL  3 mL Intravenous Q12H Cooper, Michael, MD   3 mL at 09/23/24 1008   sodium chloride  flush (NS) 0.9 % injection 3 mL  3 mL Intravenous PRN Cooper, Michael, MD         Discharge Medications: Please see discharge summary for a list of discharge medications.  Relevant Imaging Results:  Relevant Lab Results:   Additional Information SSN  [id number]  Lauraine FORBES Saa, LCSWA     "

## 2024-09-23 NOTE — Progress Notes (Signed)
 Transition of Care [facility]) - Inpatient Brief Assessment   Patient Details  Name: Jacey Eckerson MRN: [id number] Date of Birth: 1940/12/25  [facility] CM/SW Contact:    Rosaline JONELLE Joe, RN Phone Number: 09/23/2024, 9:48 AM   Clinical Narrative: CM met with the patient and son at the bedside to discuss TOC needs.  The patient lives at home with spouse is has Museum/gallery Exhibitions Officer and RW at the home.  The patient and son would prefer patient to go to short term rehabilitation at [facility] before returning home.  MSW notified of patient's request for SNF placement.  PT is currently at the bedside and will support SNF placement at this time.  Patient is currently on oxygen at the hospital but RA at home.  MSW asked to fax patient out for SNF placement and bed offers.  The patient's son, Francis is available to assist with bed offers when available for choice.   Transition of Care Asessment: Insurance and Status: (P) Insurance coverage has been reviewed Patient has primary care physician: (P) Yes Home environment has been reviewed: (P) from home with spouse Prior level of function:: (P) family assistance Prior/Current Home Services: (P) No current home services Social Drivers of Health Review: (P) SDOH reviewed needs interventions Readmission risk has been reviewed: (P) Yes Transition of care needs: (P) transition of care needs identified, TOC will continue to follow

## 2024-09-23 NOTE — TOC Progression Note (Addendum)
 Transition of Care [facility]) - Progression Note    Patient Details  Name: Alyssa Hudson MRN: [id number] Date of Birth: 08/27/1941  [facility]) CM/SW Contact  Lauraine FORBES Saa, LCSWA Phone Number: 09/23/2024, 12:38 PM  Clinical Narrative:     12:41 PM RNCM notified medical team of patient's interest in discharging to SNF and preference in [facility]. PT changed recommendation from [facility] to SNF. CSW sent patient's FL2 to [facility] and other SNFs in [facility]. CSW will continue to follow.  3:11 PM CSW introduced self and role to patient. Patient's son was also present. CSW verbally  informed patient and patient's son of patient's current SNF options ([facility], [facility], [facility]). Patient and patient's son expressed continued preference in [facility]. CSW informed SNF of bed acceptance. SNF informed CSW that they could admit tomorrow if patient's SNF insurance authorization is approved and patient is medically ready for discharge. CSW made medical team aware. CSW requested SNF to submit SNF insurance authorization as patient is not active with [employer]. SNF to submit SNF insurance authorization.    Expected Discharge Plan: Skilled Nursing Facility Barriers to Discharge: Continued Medical Work up, English As A Second Language Teacher, SNF Pending bed offer               Expected Discharge Plan and Services In-house Referral: Clinical Social Work Discharge Planning Services: [employer] Consult Post Acute Care Choice: Skilled Nursing Facility Living arrangements for the past 2 months: Single Family Home                                       Social Drivers of Health (SDOH) Interventions SDOH Screenings   Food Insecurity: No Food Insecurity (09/19/2024)  Housing: Low Risk (09/19/2024)  Transportation Needs: No Transportation Needs (09/19/2024)  Utilities: Not At Risk (09/19/2024)  Social Connections: Socially  Integrated (09/19/2024)  Tobacco Use: Low Risk (09/20/2024)    Readmission Risk Interventions     No data to display

## 2024-09-23 NOTE — Progress Notes (Signed)
 Physical Therapy Treatment Patient Details Name: Alyssa Hudson MRN: [id number] DOB: 1940-12-28 Today's Date: 09/23/2024   History of Present Illness Pt is an 83 y/o F presenting to ED on 12/20 with SOB, fall with LLE hematoma. CXR with bil PNA. Pt with RR called 12/21 for tachypnea on bipap, intubated 12/21 and transferred to ICU. Admitted for acute hypoxemic respiratory failure 2/2 PNA. Extubated 12/23. PMH includes HTN, A fib, cervical radiculopathy.    PT Comments  Pt received in bed, reports not sleeping well last night. Pt with 4/4 DOE with only 2 mins activity today, fatigued very quickly. Performed short bursts of activity to pt's tolerance in supine, seated, and standing. HR 102 bpm and SPO2 96% on 2L O2. Pt did not feel she would be able to breathe on RA. Worked on transfers and pregait acitivities. Pt could not tolerate ambulation progression. Given slow progress, patient will benefit from continued inpatient follow up therapy, <3 hours/day. Pt and son agreeable to this. PT will continue to follow.      If plan is discharge home, recommend the following: A little help with bathing/dressing/bathroom;Assistance with cooking/housework;Help with stairs or ramp for entrance;A lot of help with walking and/or transfers   Can travel by private vehicle     Yes  Equipment Recommendations  None recommended by PT    Recommendations for Other Services       Precautions / Restrictions Precautions Precautions: Fall Recall of Precautions/Restrictions: Intact Precaution/Restrictions Comments: watch O2 Restrictions Weight Bearing Restrictions Per Provider Order: No     Mobility  Bed Mobility Overal bed mobility: Needs Assistance Bed Mobility: Supine to Sit     Supine to sit: Supervision, HOB elevated, Used rails     General bed mobility comments: no physical assist needed but did need increased time and use of rails and elevated HOB. Has been sleeping in lift chair at home     Transfers Overall transfer level: Needs assistance Equipment used: Rolling walker (2 wheels) Transfers: Sit to/from Stand, Bed to chair/wheelchair/BSC Sit to Stand: Min assist   Step pivot transfers: Min assist       General transfer comment: performed multiple times for strengthening, from bed and recliner. Pt incontinent with STS, maintained standing for clean up, fatgued with activity    Ambulation/Gait Ambulation/Gait assistance: Min assist Gait Distance (Feet): 3 Feet Assistive device: Rolling walker (2 wheels)   Gait velocity: decreased Gait velocity interpretation: <1.31 ft/sec, indicative of household ambulator Pre-gait activities: static standing, marching in place General Gait Details: steps to recliner but pt not feeling up to further ambulation and fatigued from only pregait work. DOE 4/4 after 2 mins up each time   Stairs             Wheelchair Mobility     Tilt Bed    Modified Rankin (Stroke Patients Only)       Balance Overall balance assessment: Needs assistance Sitting-balance support: Feet supported Sitting balance-Leahy Scale: Good     Standing balance support: During functional activity, Reliant on assistive device for balance, Single extremity supported, Bilateral upper extremity supported Standing balance-Leahy Scale: Fair Standing balance comment: maintains standing with single UE support                            Communication Communication Communication: Impaired Factors Affecting Communication: Hearing impaired  Cognition Arousal: Alert Behavior During Therapy: WFL for tasks assessed/performed   PT - Cognitive impairments: No apparent impairments  PT - Cognition Comments: sometimes needs instructions given mutliple times due to [facility] Following commands: Intact      Cueing Cueing Techniques: Verbal cues  Exercises General Exercises - Lower Extremity Ankle Circles/Pumps: AROM,  Both, 10 reps, Seated, Supine Quad Sets: AROM, Both, 10 reps, Seated Gluteal Sets: AROM, Both, 10 reps, Seated Long Arc Quad: AROM, Both, 10 reps, Seated Heel Slides: AROM, Both, 10 reps, Supine Other Exercises Other Exercises: shoulder flex bilaterally x10    General Comments General comments (skin integrity, edema, etc.): SPO2 96% on 2L O2, HR 102 bpm, BP stable with positional changes      Pertinent Vitals/Pain Pain Assessment Pain Assessment: Faces Faces Pain Scale: Hurts a little bit Pain Location: BLE knees Pain Descriptors / Indicators: Discomfort, Grimacing Pain Intervention(s): Limited activity within patient's tolerance, Monitored during session    Home Living                          Prior Function            PT Goals (current goals can now be found in the care plan section) Acute Rehab PT Goals Patient Stated Goal: to return home but agreeable to ST rehab if needed PT Goal Formulation: With patient Time For Goal Achievement: 10/07/24 Potential to Achieve Goals: Fair Progress towards PT goals: Progressing toward goals    Frequency    Min 2X/week      PT Plan      Co-evaluation              AM-PAC PT 6 Clicks Mobility   Outcome Measure  Help needed turning from your back to your side while in a flat bed without using bedrails?: A Little Help needed moving from lying on your back to sitting on the side of a flat bed without using bedrails?: A Little Help needed moving to and from a bed to a chair (including a wheelchair)?: A Little Help needed standing up from a chair using your arms (e.g., wheelchair or bedside chair)?: A Lot Help needed to walk in hospital room?: Total Help needed climbing 3-5 steps with a railing? : Total 6 Click Score: 13    End of Session Equipment Utilized During Treatment: Gait belt;Oxygen Activity Tolerance: Patient limited by fatigue Patient left: in chair;with call bell/phone within reach;with  family/visitor present Nurse Communication: Mobility status PT Visit Diagnosis: Unsteadiness on feet (R26.81);Muscle weakness (generalized) (M62.81)     Time: 9072-9040 PT Time Calculation (min) (ACUTE ONLY): 32 min  Charges:    $Therapeutic Exercise: 8-22 mins $Therapeutic Activity: 8-22 mins PT General Charges $$ ACUTE PT VISIT: 1 Visit                     Richerd Lipoma, PT  Acute Rehab Services Secure chat preferred Office [phone]    Richerd L Brynja Marker 09/23/2024, 11:59 AM

## 2024-09-24 ENCOUNTER — Inpatient Hospital Stay (HOSPITAL_COMMUNITY)

## 2024-09-24 DIAGNOSIS — R0603 Acute respiratory distress: Secondary | ICD-10-CM | POA: Diagnosis not present

## 2024-09-24 HISTORY — PX: IR THORACENTESIS ASP PLEURAL SPACE W/IMG GUIDE: IMG5380

## 2024-09-24 LAB — BODY FLUID CELL COUNT WITH DIFFERENTIAL
Eos, Fluid: 0 %
Lymphs, Fluid: 80 %
Monocyte-Macrophage-Serous Fluid: 5 % — ABNORMAL LOW (ref 50–90)
Neutrophil Count, Fluid: 15 % (ref 0–25)
Total Nucleated Cell Count, Fluid: 149 uL (ref 0–1000)

## 2024-09-24 LAB — COMPREHENSIVE METABOLIC PANEL WITH GFR
ALT: 7 U/L (ref 0–44)
AST: <10 U/L — ABNORMAL LOW (ref 15–41)
Albumin: 3.2 g/dL — ABNORMAL LOW (ref 3.5–5.0)
Alkaline Phosphatase: 67 U/L (ref 38–126)
Anion gap: 13 (ref 5–15)
BUN: 54 mg/dL — ABNORMAL HIGH (ref 8–23)
CO2: 26 mmol/L (ref 22–32)
Calcium: 8.9 mg/dL (ref 8.9–10.3)
Chloride: 95 mmol/L — ABNORMAL LOW (ref 98–111)
Creatinine, Ser: 1.87 mg/dL — ABNORMAL HIGH (ref 0.44–1.00)
GFR, Estimated: 26 mL/min — ABNORMAL LOW
Glucose, Bld: 139 mg/dL — ABNORMAL HIGH (ref 70–99)
Potassium: 4.4 mmol/L (ref 3.5–5.1)
Sodium: 134 mmol/L — ABNORMAL LOW (ref 135–145)
Total Bilirubin: 0.8 mg/dL (ref 0.0–1.2)
Total Protein: 6.5 g/dL (ref 6.5–8.1)

## 2024-09-24 LAB — CBC
HCT: 26.5 % — ABNORMAL LOW (ref 36.0–46.0)
Hemoglobin: 8.6 g/dL — ABNORMAL LOW (ref 12.0–15.0)
MCH: 28 pg (ref 26.0–34.0)
MCHC: 32.5 g/dL (ref 30.0–36.0)
MCV: 86.3 fL (ref 80.0–100.0)
Platelets: 196 K/uL (ref 150–400)
RBC: 3.07 MIL/uL — ABNORMAL LOW (ref 3.87–5.11)
RDW: 14.8 % (ref 11.5–15.5)
WBC: 17.1 K/uL — ABNORMAL HIGH (ref 4.0–10.5)
nRBC: 0.1 % (ref 0.0–0.2)

## 2024-09-24 LAB — GRAM STAIN

## 2024-09-24 LAB — LACTATE DEHYDROGENASE: LDH: 165 U/L (ref 105–235)

## 2024-09-24 MED ORDER — LIDOCAINE HCL 1 % IJ SOLN
INTRAMUSCULAR | Status: AC
  Filled 2024-09-24: qty 20

## 2024-09-24 MED ORDER — FUROSEMIDE 10 MG/ML IJ SOLN
40.0000 mg | Freq: Once | INTRAMUSCULAR | Status: AC
  Administered 2024-09-24: 40 mg via INTRAVENOUS
  Filled 2024-09-24: qty 4

## 2024-09-24 MED ORDER — LIDOCAINE HCL 1 % IJ SOLN
20.0000 mL | Freq: Once | INTRAMUSCULAR | Status: AC
  Administered 2024-09-24: 10 mL

## 2024-09-24 MED ORDER — ALBUMIN HUMAN 25 % IV SOLN
INTRAVENOUS | Status: AC
  Filled 2024-09-24: qty 50

## 2024-09-24 MED ORDER — FUROSEMIDE 40 MG PO TABS
40.0000 mg | ORAL_TABLET | Freq: Every day | ORAL | Status: DC
  Administered 2024-09-25: 40 mg via ORAL
  Filled 2024-09-24: qty 1

## 2024-09-24 NOTE — TOC Progression Note (Signed)
 Transition of Care [facility]) - Progression Note    Patient Details  Name: Alyssa Hudson MRN: [id number] Date of Birth: 1940-12-14  [facility]) CM/SW Contact  Lauraine FORBES Saa, LCSWA Phone Number: 09/24/2024, 11:46 AM  Clinical Narrative:     11:46 AM [facility] informed CSW that SNF insurance authorization is pending and that additional therapy notes were requested. SNF also informed CSW that their pharmacy is closed New Years Day and that patient could admit tomorrow or Friday. CSW made medical team aware of above information. CSW will continue to follow.  Expected Discharge Plan: Skilled Nursing Facility Barriers to Discharge: English As A Second Language Teacher, Continued Medical Work up               Expected Discharge Plan and Services In-house Referral: Clinical Social Work Discharge Planning Services: [employer] Consult Post Acute Care Choice: Skilled Nursing Facility Living arrangements for the past 2 months: Single Family Home                                       Social Drivers of Health (SDOH) Interventions SDOH Screenings   Food Insecurity: No Food Insecurity (09/19/2024)  Housing: Low Risk (09/19/2024)  Transportation Needs: No Transportation Needs (09/19/2024)  Utilities: Not At Risk (09/19/2024)  Social Connections: Socially Integrated (09/19/2024)  Tobacco Use: Low Risk (09/20/2024)    Readmission Risk Interventions     No data to display

## 2024-09-24 NOTE — Progress Notes (Signed)
Placed patient on bipap due to increased work of breathing.

## 2024-09-24 NOTE — Progress Notes (Signed)
 [facility] Progress Note Patient: Nomie Buchberger FMW:[id number] DOB: 1940/11/03  DOA: 09/14/2024 DOS: the patient was seen and examined on 09/24/2024  Brief Hospital Course: Patient with PMH of aortic stenosis, chronic HFpEF, chronic A-fib, HTN presented to the hospital with cough and shortness of breath. Found to have pneumonia.  Initially admitted to [facility] TS service. Troponins were elevated.  Cardiology was consulted. Rapid response was called on 12/21. Underwent cardiac catheterization.  PCCM was consulted.  Patient was intubated on the vent. 12/25 transferred out of the ICU or hospitalist service. 12/26 flash pulmonary edema at nighttime 12/30 thoracentesis of Right Pleural Effusion  Assessment and Plan: Acute hypoxic respiratory failure. Secondary pneumonia as well as non-STEMI. Was intubated on 12/21. Extubated on 12/23. MRSA negative.  Cultures are so far negative. Treated with IV Zosyn .  While the oxygenation is improving patient remains persistently tachypneic. It appears that patient's chest x-ray shows worsening pleural effusion and therefore we will perform a CT of the chest. IR consulted for thoracentesis as well.   Flash pulmonary edema. Acute diastolic CHF. Received IV Lasix . Appreciate cardiology consultation.  Currently signed off. Developed severe respiratory stress on 12/26 requiring BiPAP therapy again. Blood pressure medication adjusted. Continue diuresis with Lasix .  Resume tomorrow.   Non-STEMI. Severe triple-vessel disease. Acute diastolic CHF. Status post PCI to DES to LAD. Cardiogenic shock. Shock currently resolved. Required pressors in the ICU. Currently on DAPT.  Along with Eliquis . Stop aspirin  after 30 days from 09/15/2024. Currently on statin. Management per cardiology.   Paroxysmal A-fib. Currently being treated with amiodarone . On Eliquis  for anticoagulation. Per cardiology patient is on nifedipine .  Monitor.   Hypertensive  emergency. Blood pressure now better. Cardiology switched her to nifedipine  XL.   AKI on CKD 3B. Baseline creatinine appears to be around 1.4. Serum creatinine peaked at 2.56. Likely secondary to hemodynamic instability and shock. Initially improved, now trending up. Monitor.   Acute blood loss anemia. Patient had an injury and a fall a few days ago. Sustained significant hematoma of the left leg. Hemoglobin dropped to 7.7. Transfuse for hemoglobin less than 8. Continue DAPT. Monitor H&H. Iron  deficiency noted on the workup.  Given IV iron . B12 folic acid normal.   Proteus UTI. Treated with antibiotic already.   Subjective: Ongoing shortness of breath.  No nausea or vomiting.  No chest pain.  Still has some cough.  Physical Exam: Bilateral basilar crackles. S1-S2 present. Wheezing is resolved. No edema of the lower extremity.   Left leg hematoma unchanged.  Data Reviewed: I have Reviewed nursing notes, Vitals, and Lab results. Since last encounter, pertinent lab results CBC BMP   . I have ordered test including CBC and BMP  .   Disposition: Status is: Inpatient Remains inpatient appropriate because: Monitor for improvement in volume status and shortness of breath  apixaban  (ELIQUIS ) tablet 2.5 mg Start: 09/17/24 1615 apixaban  (ELIQUIS ) tablet 2.5 mg   Family Communication: Discussed with family at bedside Level of care: Progressive continue Vitals:   09/24/24 1006 09/24/24 1054 09/24/24 1631 09/24/24 1633  BP:  (!) 139/57  (!) 123/50  Pulse:  84  87  Resp:  (!) 34 20 (!) 24  Temp:  (!) 97.5 F (36.4 C)    TempSrc:  Axillary    SpO2: 100% 100%  98%  Weight:      Height:         Author: Yetta Blanch, MD 09/24/2024 5:33 PM  Please look on [email or web address] to find out who is  on call.

## 2024-09-24 NOTE — Progress Notes (Signed)
 RT NOTE: RT to room to check on PT. PT on [place] in no distress with no increased WOB and comfortable at this time. RT told PT to call if feels like needs to go back on bipap. Vitals are stable. RT will continue to monitor.

## 2024-09-24 NOTE — Progress Notes (Signed)
 Pt is having increased work of breathing, increased RR 30-40's, pursed lip breathing, requesting nebulizer treatment that was previously given. Attempted to notify Opyd, MD; awaiting further orders.  Lonell LITTIE Lyme, RN

## 2024-09-24 NOTE — Procedures (Signed)
 PROCEDURE SUMMARY:  Successful image-guided diagnostic and therapeutic thoracentesis from the right chest.  Yielded 850 mL of cloudy amber fluid.  No immediate complications.  EBL: zero Patient tolerated well.   Specimen sent for labs.  Post-procedure CXR ordered and obtained prior to departure from department.   Please see imaging section of Epic for full dictation.  Peyson Delao B Nikiyah Fackler NP 09/24/2024 3:26 PM

## 2024-09-24 NOTE — Plan of Care (Signed)
  Problem: Education: Goal: Knowledge of General Education information will improve Description: Including pain rating scale, medication(s)/side effects and non-pharmacologic comfort measures Outcome: Progressing   Problem: Health Behavior/Discharge Planning: Goal: Ability to manage health-related needs will improve Outcome: Progressing   Problem: Clinical Measurements: Goal: Ability to maintain clinical measurements within normal limits will improve Outcome: Progressing Goal: Will remain free from infection Outcome: Progressing Goal: Diagnostic test results will improve Outcome: Progressing Goal: Respiratory complications will improve Outcome: Progressing Goal: Cardiovascular complication will be avoided Outcome: Progressing   Problem: Activity: Goal: Risk for activity intolerance will decrease Outcome: Progressing   Problem: Nutrition: Goal: Adequate nutrition will be maintained Outcome: Progressing   Problem: Coping: Goal: Level of anxiety will decrease Outcome: Progressing   Problem: Elimination: Goal: Will not experience complications related to bowel motility Outcome: Progressing Goal: Will not experience complications related to urinary retention Outcome: Progressing   Problem: Pain Managment: Goal: General experience of comfort will improve and/or be controlled Outcome: Progressing   Problem: Safety: Goal: Ability to remain free from injury will improve Outcome: Progressing   Problem: Skin Integrity: Goal: Risk for impaired skin integrity will decrease Outcome: Progressing   Problem: Education: Goal: Understanding of CV disease, CV risk reduction, and recovery process will improve Outcome: Progressing Goal: Individualized Educational Video(s) Outcome: Progressing   Problem: Activity: Goal: Ability to return to baseline activity level will improve Outcome: Progressing   Problem: Cardiovascular: Goal: Ability to achieve and maintain adequate  cardiovascular perfusion will improve Outcome: Progressing Goal: Vascular access site(s) Level 0-1 will be maintained Outcome: Progressing

## 2024-09-25 ENCOUNTER — Inpatient Hospital Stay (HOSPITAL_COMMUNITY)

## 2024-09-25 DIAGNOSIS — R0603 Acute respiratory distress: Secondary | ICD-10-CM | POA: Diagnosis not present

## 2024-09-25 LAB — BASIC METABOLIC PANEL WITH GFR
Anion gap: 13 (ref 5–15)
BUN: 58 mg/dL — ABNORMAL HIGH (ref 8–23)
CO2: 26 mmol/L (ref 22–32)
Calcium: 8.8 mg/dL — ABNORMAL LOW (ref 8.9–10.3)
Chloride: 95 mmol/L — ABNORMAL LOW (ref 98–111)
Creatinine, Ser: 1.88 mg/dL — ABNORMAL HIGH (ref 0.44–1.00)
GFR, Estimated: 26 mL/min — ABNORMAL LOW
Glucose, Bld: 131 mg/dL — ABNORMAL HIGH (ref 70–99)
Potassium: 4.6 mmol/L (ref 3.5–5.1)
Sodium: 133 mmol/L — ABNORMAL LOW (ref 135–145)

## 2024-09-25 LAB — PROTEIN, BODY FLUID (OTHER)
Source of Sample: 19588
Total Protein, Body Fluid Other: 1.5 g/dL

## 2024-09-25 LAB — CBC
HCT: 28.4 % — ABNORMAL LOW (ref 36.0–46.0)
Hemoglobin: 9.2 g/dL — ABNORMAL LOW (ref 12.0–15.0)
MCH: 28.5 pg (ref 26.0–34.0)
MCHC: 32.4 g/dL (ref 30.0–36.0)
MCV: 87.9 fL (ref 80.0–100.0)
Platelets: 184 K/uL (ref 150–400)
RBC: 3.23 MIL/uL — ABNORMAL LOW (ref 3.87–5.11)
RDW: 14.7 % (ref 11.5–15.5)
WBC: 19.1 K/uL — ABNORMAL HIGH (ref 4.0–10.5)
nRBC: 0.1 % (ref 0.0–0.2)

## 2024-09-25 LAB — MAGNESIUM: Magnesium: 2.1 mg/dL (ref 1.7–2.4)

## 2024-09-25 LAB — LD, BODY FLUID (OTHER)
LD, Body Fluid: 88 IU/L
Source of Sample: 100156

## 2024-09-25 LAB — CYTOLOGY - NON PAP

## 2024-09-25 LAB — LACTIC ACID, PLASMA: Lactic Acid, Venous: 2.1 mmol/L (ref 0.5–1.9)

## 2024-09-25 MED ORDER — FUROSEMIDE 10 MG/ML IJ SOLN
80.0000 mg | Freq: Two times a day (BID) | INTRAMUSCULAR | Status: AC
  Administered 2024-09-25 – 2024-09-26 (×2): 80 mg via INTRAVENOUS
  Filled 2024-09-25 (×2): qty 8

## 2024-09-25 MED ORDER — AMIODARONE HCL 200 MG PO TABS
200.0000 mg | ORAL_TABLET | Freq: Two times a day (BID) | ORAL | Status: DC
  Administered 2024-09-25 – 2024-09-30 (×9): 200 mg via ORAL
  Filled 2024-09-25 (×10): qty 1

## 2024-09-25 MED ORDER — MORPHINE SULFATE (PF) 2 MG/ML IV SOLN
2.0000 mg | Freq: Once | INTRAVENOUS | Status: AC
  Administered 2024-09-25: 2 mg via INTRAVENOUS
  Filled 2024-09-25: qty 1

## 2024-09-25 MED ORDER — FUROSEMIDE 10 MG/ML IJ SOLN
40.0000 mg | Freq: Once | INTRAMUSCULAR | Status: AC
  Administered 2024-09-25: 40 mg via INTRAVENOUS
  Filled 2024-09-25: qty 4

## 2024-09-25 NOTE — Progress Notes (Addendum)
 "  Progress Note  Patient Name: Alyssa Hudson Date of Encounter: 09/25/2024 [facility] Cardiologist: Jennifer JONELLE Crape, MD   Interval Summary   Reported improvement in her shortness of breath after thoracocentesis, though short of breath again this morning. Does find she is more short of breath at night, has needed ton use the BiPAP.  Denied chest pain.   Vital Signs Vitals:   09/25/24 0300 09/25/24 0443 09/25/24 0655 09/25/24 0803  BP: (!) 116/49 (!) 119/50  121/60  Pulse: 90 85  88  Resp: (!) 36 (!) 26  (!) 27  Temp:  98.2 F (36.8 C)  97.6 F (36.4 C)  TempSrc:  Oral  Oral  SpO2: 92% 95%  94%  Weight:   70.2 kg   Height:        Intake/Output Summary (Last 24 hours) at 09/25/2024 1137 Last data filed at 09/24/2024 1950 Gross per 24 hour  Intake --  Output 500 ml  Net -500 ml      09/25/2024    6:55 AM 09/24/2024    5:00 AM 09/23/2024    4:26 AM  Last 3 Weights  Weight (lbs) 154 lb 12.2 oz 155 lb 13.8 oz 151 lb 3.8 oz  Weight (kg) 70.2 kg 70.7 kg 68.6 kg      Telemetry/ECG  Went into rate controlled AF [HR ~85]  ~ 0800 today, was previously in sinus with 1st degree AV block.  - Personally Reviewed  Physical Exam  GEN: No acute distress, though uncomfortable appearing Neck: JVD Cardiac: Irregularly, irregular, no murmurs, rubs, or gallops.  Respiratory: Crackles at bases, tachypneic  GI: Soft, nontender, non-distended  MS: No edema, extensive bruising to left leg  Assessment & Plan  Alyssa Hudson is a 83 y.o. female with history of HTN, hx pericarditis in 2021, SVT, CKD. Presented with worsening dyspnea, hypoxia and troponin elevation   Significant events: 12/20: presented with shortness of breath and fall 12/21: Acute respiratory failure and NSTEMI w/ worsening ST changes on ECG. Abx d/t concern for PNA. Urgent LHC - s/p PCI/DES p LAD, PCI/placement 2 overlapping DES p LCX. LVEDP 21. Intubated post cath. Lactic acid 3. 12/23 Extubated.  Diuretics held d/t AKI  Cardiology signed off on 12/28. Asked to re-engage on 12/31 for decompensated HF.   Acute on chronic diastolic heart failure Acute respiratory failure with hypoxia Earlier this admission cardiogenic +/ septic shock- resolved Pro BNP on 12/29 > 35,000 Underwent R thoracocentesis on 12/30, ~869mL of cloudy amber fluid  Overnight patient experienced worsening shortness of breath, per chart review she was volume overloaded and CXR obtained showed pleural effusions. She received IV lasix  40 mg overnight, then followed by scheduled oral lasix  40 mg.  Net IO Since Admission: -4,017.44 mL [09/25/24 1143] Low urine output charted Dry weight unclear, + 4lb since cardiology SO, similar weight to admission weight.  On exam appears, volume up.  Low urine output with previously lasix  dose. BP 104/51. Will recent cardiogenic shock will order lactic acid.   Increase IV lasix  80 mg this afternoon, will need to closely monitor renal function and blood pressure.  Renal function precludes MRA and SGLT2i  CAD s/p interventions as above Residual disease in RCA and diagonal branch to be treated medically  Continue ASA 81 for 30 days post cath, then dc Continue plavix  75 mg Continue imdur  30 mg Consider starting BB prior to discharge, will hold today given decompensation and hypotension  Atrial Fibrillation, new onset Currently in rate  controlled AF Chad Vas score 6  If HR elevates may need to convert back to IV amiodarone , though will hold for now. Prior this admission plan was to pursue Tee/DCCV though she converted back to sinus rhythm spontaneously.   Continue amiodarone  200 mg Continue eliquis  2.5 mg BID, reduced for renal function and age  Hypertension BP: 104/51 Hold Nifedipine  60 mg for now Continue Imdur  30 mg as BP allows  Hyperlipidemia  Continue lipitor 40 mg  Per primary Worsening leukocytosis Pneumonia AKI on CKD ABLA Fall   For questions or updates,  please contact [facility] Please consult [email or web address] for contact info under       Signed, Leontine LOISE Salen, PA-C   Patient seen and examined   I agree with findings as noted by LITTIE Salen above   PT admitted with SOB and fall  + NSTEMI.  Underwent emergent cath  with DES to LAD; DES x 2 to LCx   Intubated. In shock at time  Note LVEF on 09/14/24 echo was 55 to 60%    Pt last seen by cardiology on 12/28   At that time switched to PO lasix , feeling better      On 12/29  note reflects pt had wheezes, rales   CT chest ordered Thoracentesis ordered 12/30 pt had 850 cc pleural fluid drained   Pt reported feeling better right after but then developed  some wheezing  She still has some SOB now   On exam, sitting up in bed in NAD Neck  increased JVP Lungs   Decreased BS at L base   Rales at R base  Cardiac Irreg irreg  No S3 Abd is benign Ext   No signif edema   Tele with Afib 80s   Was in SR yesterday CXR today with prog CHF, worse bilateral effusion   Recomm:    HFpEF   Volume is up    Recomm increasing  IV lasix  to 80 bid  Strict I/O  Follow renal functon   PAF Pt was in afib earlier admit    On IV amiodarone  then Now PO  Given clinical deterioration I would switch back to IV for now to hasten load and hopefully convert back to SR    Follow BP     Vina Gull MD     "

## 2024-09-25 NOTE — Progress Notes (Signed)
" °   09/25/24 2134  BiPAP/CPAP/SIPAP  BiPAP/CPAP/SIPAP Pt Type Adult  BiPAP/CPAP/SIPAP SERVO  Reason BIPAP/CPAP not in use  (Pt family request pt to not be put on BIPAP so that pt can rest. BIPAP on standby.)  Mask Type Full face mask  BiPAP/CPAP /SiPAP Vitals  Bilateral Breath Sounds Diminished    "

## 2024-09-25 NOTE — Plan of Care (Signed)
" °  Problem: Health Behavior/Discharge Planning: Goal: Ability to manage health-related needs will improve Outcome: Not Progressing   Problem: Activity: Goal: Risk for activity intolerance will decrease Outcome: Not Progressing   Problem: Nutrition: Goal: Adequate nutrition will be maintained Outcome: Not Progressing   Problem: Coping: Goal: Level of anxiety will decrease Outcome: Progressing   Problem: Elimination: Goal: Will not experience complications related to bowel motility Outcome: Progressing Goal: Will not experience complications related to urinary retention Outcome: Progressing   Problem: Pain Managment: Goal: General experience of comfort will improve and/or be controlled Outcome: Progressing   Problem: Safety: Goal: Ability to remain free from injury will improve Outcome: Progressing   Problem: Skin Integrity: Goal: Risk for impaired skin integrity will decrease Outcome: Progressing   Problem: Education: Goal: Understanding of CV disease, CV risk reduction, and recovery process will improve Outcome: Progressing Goal: Individualized Educational Video(s) Outcome: Progressing   Problem: Activity: Goal: Ability to return to baseline activity level will improve Outcome: Progressing   Problem: Cardiovascular: Goal: Ability to achieve and maintain adequate cardiovascular perfusion will improve Outcome: Progressing Goal: Vascular access site(s) Level 0-1 will be maintained Outcome: Progressing   Problem: Health Behavior/Discharge Planning: Goal: Ability to safely manage health-related needs after discharge will improve Outcome: Progressing   "

## 2024-09-25 NOTE — Plan of Care (Signed)

## 2024-09-25 NOTE — Progress Notes (Signed)
 Physical Therapy Treatment Patient Details Name: Alyssa Hudson MRN: [id number] DOB: 1941-05-26 Today's Date: 09/25/2024   History of Present Illness Pt is an 83 y/o F presenting to ED on 12/20 with SOB, fall with LLE hematoma. CXR with bil PNA. Pt with RR called 12/21 for tachypnea on bipap, intubated 12/21 and transferred to ICU. Admitted for acute hypoxemic respiratory failure 2/2 PNA. Extubated 12/23. PMH includes HTN, A fib, cervical radiculopathy.    PT Comments  The pt was agreeable to session, reports she is eager to attempt mobility despite increased work of breathing and elevated RR at this time. Pt with RR up to 44 at rest in chair position in bed, tolerating 3-5 reps of LE exercises at a time due to increased work of breathing, then able to resume after rest with cues for PLB. The pt did tolerate sitting EOB with improvement in RR (13-24) and other VSS, but fatigues quickly and reports dizziness with sitting EOB eventually requiring return to supine prior to attempting OOB transfers at this time. Pt needing up to minA to maintain seated balance, especially with LE movements. Given continued deficits in LE strength, balance, and endurance, continue to recommend inpatient rehab <3hours/day once medically stable for d/c.    If plan is discharge home, recommend the following: A little help with bathing/dressing/bathroom;Assistance with cooking/housework;Help with stairs or ramp for entrance;A lot of help with walking and/or transfers   Can travel by private vehicle     No  Equipment Recommendations  None recommended by PT    Recommendations for Other Services       Precautions / Restrictions Precautions Precautions: Fall Recall of Precautions/Restrictions: Intact Precaution/Restrictions Comments: watch O2 and RR Restrictions Weight Bearing Restrictions Per Provider Order: No     Mobility  Bed Mobility Overal bed mobility: Needs Assistance Bed Mobility: Supine to Sit, Sit to  Supine     Supine to sit: Min assist, HOB elevated, Used rails Sit to supine: Min assist, Used rails   General bed mobility comments: minA to elevate trunk and maintain sitting balance initially, pt leaning to R when fatigued. Assist to LE to return to bed    Transfers                   General transfer comment: pt too fatigued after sitting EOB 5 min, RR in 40s      Balance Overall balance assessment: Needs assistance Sitting-balance support: Feet supported, Bilateral upper extremity supported Sitting balance-Leahy Scale: Fair Sitting balance - Comments: up to minA to steady, tolerated sitting EOB x5 min Postural control: Right lateral lean, Posterior lean                                  Communication Communication Communication: Impaired Factors Affecting Communication: Hearing impaired  Cognition Arousal: Alert Behavior During Therapy: WFL for tasks assessed/performed   PT - Cognitive impairments: No apparent impairments                       PT - Cognition Comments: pt fatigued and largely maintaining eyes closed, but can open and answer questions/follow commands appropriately. needing increased time to complete all movements Following commands: Intact      Cueing Cueing Techniques: Verbal cues  Exercises General Exercises - Lower Extremity Ankle Circles/Pumps: Both, 10 reps, AROM, Seated Quad Sets: AROM, Both, 10 reps, Supine Short Arc Quad: AROM, Both, 10 reps,  Seated Long Arc Quad: AROM, Both, 10 reps, Seated Heel Slides: AROM, Both, 10 reps, Supine    General Comments General comments (skin integrity, edema, etc.): SpO2 94-99% on 3L, RR 24-44 sitting in bed, 13-25 when sitting EOB      Pertinent Vitals/Pain Pain Assessment Pain Assessment: No/denies pain     PT Goals (current goals can now be found in the care plan section) Acute Rehab PT Goals Patient Stated Goal: to return home but agreeable to ST rehab if  needed Progress towards PT goals: Not progressing toward goals - comment (resp status decline)    Frequency    Min 2X/week       AM-PAC PT 6 Clicks Mobility   Outcome Measure  Help needed turning from your back to your side while in a flat bed without using bedrails?: A Little Help needed moving from lying on your back to sitting on the side of a flat bed without using bedrails?: A Little Help needed moving to and from a bed to a chair (including a wheelchair)?: A Lot Help needed standing up from a chair using your arms (e.g., wheelchair or bedside chair)?: A Lot Help needed to walk in hospital room?: Total Help needed climbing 3-5 steps with a railing? : Total 6 Click Score: 12    End of Session Equipment Utilized During Treatment: Oxygen Activity Tolerance: Patient limited by fatigue Patient left: with call bell/phone within reach;with family/visitor present;in bed Nurse Communication: Mobility status PT Visit Diagnosis: Unsteadiness on feet (R26.81);Muscle weakness (generalized) (M62.81)     Time: 8890-8866 PT Time Calculation (min) (ACUTE ONLY): 24 min  Charges:    $Therapeutic Exercise: 8-22 mins $Therapeutic Activity: 8-22 mins PT General Charges $$ ACUTE PT VISIT: 1 Visit                     Izetta Call, PT, DPT   Acute Rehabilitation Department Office [phone] Secure Chat Communication Preferred   Izetta JULIANNA Call 09/25/2024, 1:33 PM

## 2024-09-25 NOTE — Progress Notes (Signed)
 " PROGRESS NOTE  Alyssa Hudson FMW:[id number] DOB: 1941-08-09 DOA: 09/14/2024 PCP: [name], MD   LOS: 11 days   Brief Narrative / Interim history: 83 year old female with history of aortic stenosis, chronic diastolic CHF, chronic A-fib, CAD, HTN comes into the hospital with cough, shortness of breath.  She was found to have pneumonia, but also had significant troponin elevation and cardiology was consulted.  She underwent a cardiac cath with stent placement to LAD.  Hospital course complicated by flash pulmonary edema having to be intubated, eventually was transferred out of the ICU on 12/25.  She has recurrent flash pulmonary edema on 12/26, and also right-sided large pleural effusion s/p thoracentesis 12/30.  She had recurrent pulmonary edema and shortness of breath overnight 12/30-12/31  Subjective / 24h Interval events: She is complaining of having a rough night last night.  Became very short of breath after midnight, also very anxious.  No chest pain this morning, breathing is somewhat improved.    Chest x-ray overnight showed worsening interstitial opacities left greater than right, worsening small bilateral pleural effusions with left greater than right, and cardiomegaly.  She was given Lasix  overnight  Assesement and Plan: Principal problem CAD, Non-STEMI -cardiology consulted and followed patient while hospitalized.  Underwent a cardiac catheterization and is now status post PCI to DES to LAD.  She also has severe triple-vessel disease.  Currently she is on Eliquis , aspirin  and Plavix , she will need to come off aspirin  on 10/16/2024 at 30 days after her cath - Continue statin  Active problems Acute on chronic diastolic CHF -patient currently with evidence of fluid overload, had worsening respiratory status last night.  I see that she is intermittently required IV furosemide , and recently placed on oral furosemide  which may not work as intended.  Cardiology consulted again  today, her CKD complicates things - Still no urine output this morning, continue to monitor throughout the day  Cardiogenic shock -was in the ICU briefly on pressors.  Now weaned off and stable on the floor.  Acute hypoxic respiratory failure -multifactorial in the setting of pneumonia, non-STEMI, flash pulmonary edema.  Remains hypoxic, chest x-ray without significant improvement  Possible pneumonia -patient completed a course of antibiotics with IV Zosyn .  Chest x-ray looks slightly worse today, she has a elevation in the white count of 19K but is afebrile and has no productive cough.  Will see how she does with furosemide  for now  AKI on CKD 3B-creatinine appears to be 1.4 at baseline.  Creatinine peaked at 2.5, likely due to shock.  Overall stable over the last couple days, 1.8 today  Acute blood loss anemia -after an injury and a fall, has significant left leg hematoma.  She is also iron  deficient status post IV iron .  She is status post a unit of packed red blood cells on 12/22  Scheduled Meds:  amiodarone   200 mg Oral Daily   apixaban   2.5 mg Oral BID   aspirin  EC  81 mg Oral Daily   atorvastatin   40 mg Oral Daily   benzonatate   100 mg Oral TID   clopidogrel   75 mg Oral Q breakfast   feeding supplement  237 mL Oral BID BM   furosemide   40 mg Oral Daily   guaiFENesin   600 mg Oral BID   isosorbide  mononitrate  30 mg Oral Daily   NIFEdipine   60 mg Oral Daily   pantoprazole   40 mg Oral QHS   sodium chloride  flush  3 mL Intravenous  Q12H   Continuous Infusions: PRN Meds:.acetaminophen , ALPRAZolam , hydrALAZINE , ipratropium-albuterol , labetalol , ondansetron  (ZOFRAN ) IV, mouth rinse, sodium chloride  flush  Current Outpatient Medications  Medication Instructions   amLODipine  (NORVASC ) 10 mg, Oral, Daily   aspirin  EC 81 mg, Oral, Daily, Swallow whole.   aspirin  EC 325 mg, Oral, Daily PRN   Biotin 5,000 mcg, Oral, Daily   CRANBERRY PO 650 mg, Oral, Daily   furosemide  (LASIX ) 40 mg,  Oral, Daily, Not taking on therapy days   hydrochlorothiazide  (HYDRODIURIL ) 25 mg, Oral, Daily, Takes only on therapy days.   losartan  (COZAAR ) 100 mg, Oral, Daily   MAGNESIUM  PO 400 mg, Daily   metoprolol  succinate (TOPROL -XL) 50 mg, Oral, Daily   OVER THE COUNTER MEDICATION 4 capsules, Daily   Probiotic Product (PROBIOTIC PO) 1 tablet, Oral, Daily    Diet Orders (From admission, onward)     Start     Ordered   09/20/24 0908  Diet Heart Room service appropriate? Yes; Fluid consistency: Thin  Diet effective now       Question Answer Comment  Room service appropriate? Yes   Fluid consistency: Thin      09/20/24 0908            DVT prophylaxis: apixaban  (ELIQUIS ) tablet 2.5 mg Start: 09/17/24 1615 apixaban  (ELIQUIS ) tablet 2.5 mg   Lab Results  Component Value Date   PLT 184 09/25/2024      Code Status: Do not attempt resuscitation (DNR) PRE-ARREST INTERVENTIONS DESIRED  Family Communication: Nephew at bedside, son on speaker phone  Status is: Inpatient Remains inpatient appropriate because: Severity of illness   Level of care: Progressive  Consultants:  Cardiology  Objective: Vitals:   09/25/24 0300 09/25/24 0443 09/25/24 0655 09/25/24 0803  BP: (!) 116/49 (!) 119/50  121/60  Pulse: 90 85  88  Resp: (!) 36 (!) 26  (!) 27  Temp:  98.2 F (36.8 C)  97.6 F (36.4 C)  TempSrc:  Oral  Oral  SpO2: 92% 95%  94%  Weight:   70.2 kg   Height:        Intake/Output Summary (Last 24 hours) at 09/25/2024 1003 Last data filed at 09/24/2024 1950 Gross per 24 hour  Intake --  Output 500 ml  Net -500 ml   Wt Readings from Last 3 Encounters:  09/25/24 70.2 kg  07/31/24 72.9 kg  07/28/23 74.1 kg    Examination:  Constitutional: NAD Eyes: no scleral icterus ENMT: Mucous membranes are moist.  Neck: normal, supple Respiratory: Tachypnea, bibasilar crackles, no wheezing heard Cardiovascular: Regular rate and rhythm, no murmurs / rubs / gallops.  Trace LE edema.   Abdomen: non distended, no tenderness. Bowel sounds positive.  Musculoskeletal: no clubbing / cyanosis.   Data Reviewed: I have independently reviewed following labs and imaging studies   CBC Recent Labs  Lab 09/21/24 0303 09/22/24 0146 09/23/24 1815 09/24/24 1059 09/25/24 0253  WBC 18.2* 13.4* 17.8* 17.1* 19.1*  HGB 9.9* 8.4* 8.3* 8.6* 9.2*  HCT 31.6* 26.4* 26.3* 26.5* 28.4*  PLT 176 168 196 196 184  MCV 91.1 88.3 89.2 86.3 87.9  MCH 28.5 28.1 28.1 28.0 28.5  MCHC 31.3 31.8 31.6 32.5 32.4  RDW 15.0 15.0 14.8 14.8 14.7  LYMPHSABS  --  3.1 2.2  --   --   MONOABS  --  1.5* 2.8*  --   --   EOSABS  --  0.0 0.0  --   --   BASOSABS  --  0.0 0.1  --   --  Recent Labs  Lab 09/19/24 0240 09/20/24 0228 09/21/24 0303 09/22/24 0146 09/23/24 0144 09/23/24 1815 09/24/24 1059 09/25/24 0253  NA 139 140 137 138 135  --  134* 133*  K 3.8 3.6 4.0 3.6 4.2  --  4.4 4.6  CL 104 101 100 99 96*  --  95* 95*  CO2 25 26 22 27 26   --  26 26  GLUCOSE 124* 127* 141* 137* 150*  --  139* 131*  BUN 67* 49* 44* 42* 45*  --  54* 58*  CREATININE 1.61* 1.64* 1.55* 1.64* 1.83*  --  1.87* 1.88*  CALCIUM  8.4* 8.5* 8.8* 8.7* 8.9  --  8.9 8.8*  AST  --   --   --   --   --   --  <10*  --   ALT  --   --   --   --   --   --  7  --   ALKPHOS  --   --   --   --   --   --  67  --   BILITOT  --   --   --   --   --   --  0.8  --   ALBUMIN  --   --   --   --   --   --  3.2*  --   MG  --  2.0 1.8 1.7  --   --   --  2.1  CRP  --   --   --   --   --  6.0*  --   --   PROCALCITON 0.39  --   --   --   --  0.25  --   --     ------------------------------------------------------------------------------------------------------------------ No results for input(s): CHOL, HDL, LDLCALC, TRIG, CHOLHDL, LDLDIRECT in the last 72 hours.  Lab Results  Component Value Date   HGBA1C 5.4 11/18/2020    ------------------------------------------------------------------------------------------------------------------ No results for input(s): TSH, T4TOTAL, T3FREE, THYROIDAB in the last 72 hours.  Invalid input(s): FREET3  Cardiac Enzymes Recent Labs  Lab 09/18/24 1823  CKMB 2.9   ------------------------------------------------------------------------------------------------------------------    Component Value Date/Time   BNP 165.4 (H) 06/29/2020 1348    CBG: Recent Labs  Lab 09/18/24 1114 09/18/24 2130 09/19/24 0831 09/20/24 2313 09/22/24 2045  GLUCAP 108* 205* 146* 155* 151*    Recent Results (from the past 240 hours)  Culture, Respiratory w Gram Stain     Status: None   Collection Time: 09/15/24  5:20 PM   Specimen: Bronchoalveolar Lavage; Respiratory  Result Value Ref Range Status   Specimen Description BRONCHIAL ALVEOLAR LAVAGE  Final   Special Requests NONE  Final   Gram Stain NO WBC SEEN NO ORGANISMS SEEN   Final   Culture   Final    NO GROWTH 2 DAYS Performed at [facility], [address], [place], [place] [address]    Report Status 09/17/2024 FINAL  Final  MRSA Next Gen by PCR, Nasal     Status: None   Collection Time: 09/16/24  6:09 AM   Specimen: Nasal Mucosa; Nasal Swab  Result Value Ref Range Status   MRSA by PCR Next Gen NOT DETECTED NOT DETECTED Final    Comment: (NOTE) The GeneXpert MRSA Assay (FDA approved for NASAL specimens only), is one component of a comprehensive MRSA colonization surveillance program. It is not intended to diagnose MRSA infection nor to guide or monitor treatment for MRSA infections. Test  performance is not FDA approved in patients less than 56 years old. Performed at [facility], [address] [place], [place] [address]   Respiratory (~20 pathogens) panel by PCR     Status: None   Collection Time: 09/16/24  2:06 PM   Specimen: Nasopharyngeal Swab; Respiratory  Result Value Ref Range  Status   Adenovirus NOT DETECTED NOT DETECTED Final   Coronavirus 229E NOT DETECTED NOT DETECTED Final    Comment: (NOTE) The Coronavirus on the Respiratory Panel, DOES NOT test for the novel  Coronavirus (2019 nCoV)    Coronavirus HKU1 NOT DETECTED NOT DETECTED Final   Coronavirus NL63 NOT DETECTED NOT DETECTED Final   Coronavirus OC43 NOT DETECTED NOT DETECTED Final   Metapneumovirus NOT DETECTED NOT DETECTED Final   Rhinovirus / Enterovirus NOT DETECTED NOT DETECTED Final   Influenza A NOT DETECTED NOT DETECTED Final   Influenza B NOT DETECTED NOT DETECTED Final   Parainfluenza Virus 1 NOT DETECTED NOT DETECTED Final   Parainfluenza Virus 2 NOT DETECTED NOT DETECTED Final   Parainfluenza Virus 3 NOT DETECTED NOT DETECTED Final   Parainfluenza Virus 4 NOT DETECTED NOT DETECTED Final   Respiratory Syncytial Virus NOT DETECTED NOT DETECTED Final   Bordetella pertussis NOT DETECTED NOT DETECTED Final   Bordetella Parapertussis NOT DETECTED NOT DETECTED Final   Chlamydophila pneumoniae NOT DETECTED NOT DETECTED Final   Mycoplasma pneumoniae NOT DETECTED NOT DETECTED Final    Comment: Performed at [facility], [address], [place], [place] [address]  Gram stain     Status: None   Collection Time: 09/24/24  3:27 PM   Specimen: Lung, Right; Pleural Fluid  Result Value Ref Range Status   Specimen Description PLEURAL  Final   Special Requests LUNG RIGHT  Final   Gram Stain   Final    RARE WBC PRESENT, PREDOMINANTLY MONONUCLEAR NO ORGANISMS SEEN Performed at [facility], [address], [place], [place] [address]    Report Status 09/24/2024 FINAL  Final  Culture, body fluid w Gram Stain-bottle     Status: None (Preliminary result)   Collection Time: 09/24/24  3:27 PM   Specimen: Pleura  Result Value Ref Range Status   Specimen Description PLEURAL  Final   Special Requests LUNG RIGHT  Final   Culture   Final    NO GROWTH < 24 HOURS Performed at [facility]  Lab, [address] [place], [place] [address]    Report Status PENDING  Incomplete     Radiology Studies: DG CHEST PORT 1 VIEW Result Date: 09/25/2024 EXAM: 1 VIEW(S) XRAY OF THE CHEST 09/25/2024 01:41:17 AM COMPARISON: 09/24/2024 CLINICAL HISTORY: Acute respiratory distress FINDINGS: LUNGS AND PLEURA: Small bilateral pleural effusions, left greater than right. Diffuse interstitial opacities, progressed from prior. Left greater than right bibasilar opacities. No pneumothorax. HEART AND MEDIASTINUM: Stable cardiomegaly. Aortic atherosclerosis. BONES AND SOFT TISSUES: Chronic right humeral neck fracture. IMPRESSION: 1. Progression of diffuse interstitial opacities and left greater than right bibasilar opacities. 2. Worsened small bilateral pleural effusions, left greater than right. 3. Stable cardiomegaly. Electronically signed by: Franky Stanford MD 09/25/2024 02:26 AM EST RP Workstation: [id number]   IR THORACENTESIS ASP PLEURAL SPACE W/IMG GUIDE Result Date: 09/24/2024 INDICATION: 83 year old female admitted with pneumonia. Presented to the hospital with cough and shortness of breath. Received request for diagnostic and therapeutic thoracentesis. EXAM: ULTRASOUND GUIDED DIAGNOSTIC AND THERAPEUTIC, RIGHT-SIDED THORACENTESIS MEDICATIONS: 4 mL 1% lidocaine  COMPLICATIONS: None immediate.  PROCEDURE: An ultrasound guided thoracentesis was thoroughly discussed with the patient and questions answered. The benefits, risks, alternatives and complications were also discussed. The patient understands and wishes to proceed with the procedure. Written consent was obtained. Ultrasound was performed to localize and mark an adequate pocket of fluid in the right chest. The area was then prepped and draped in the normal sterile fashion. 1% lidocaine  was used for local anesthesia. Under ultrasound guidance a 6 Fr Safe-T-Centesis catheter was introduced. Thoracentesis was performed. The catheter was removed and a dressing  applied. FINDINGS: A total of approximately 850 mL of cloudy amber fluid was removed. Samples were sent to the laboratory as requested by the clinical team. IMPRESSION: Successful ultrasound guided right thoracentesis yielding 850 mL of pleural fluid. Performed by: Kristi Davenport, NP and Dr. Juliene Balder Electronically Signed   By: Juliene Balder M.D.   On: 09/24/2024 19:06   DG Chest 1 View Result Date: 09/24/2024 EXAM: 1 AP VIEW(S) XRAY OF THE CHEST 09/24/2024 03:29:00 PM COMPARISON: 09/23/2024 CLINICAL HISTORY: Pleural effusion FINDINGS: LUNGS AND PLEURA: Mild pulmonary edema, slightly improved from prior. Trace right pleural effusion, decreased from prior. Small left pleural effusion and left basilar opacity, stable. Left basilar atelectasis and/or consolidation, stable. No pneumothorax. HEART AND MEDIASTINUM: Aortic atherosclerosis. No acute abnormality of the cardiac and mediastinal silhouettes. BONES AND SOFT TISSUES: No acute osseous abnormality. IMPRESSION: 1. Trace right pleural effusion, decreased from prior. 2. Small left pleural effusion, stable. Left basilar opacity, stable. 3. Mild pulmonary edema, slightly improved from prior. Electronically signed by: Morgane Naveau MD 09/24/2024 04:30 PM EST RP Workstation: [id number]     Nilda Fendt, MD, PhD [facility]  Between 7 am - 7 pm I am available, please contact me via Amion (for emergencies) or Securechat (non urgent messages)  Between 7 pm - 7 am I am not available, please contact night coverage MD/APP via Amion  "

## 2024-09-25 NOTE — TOC Progression Note (Signed)
 Transition of Care [facility]) - Progression Note    Patient Details  Name: Alyssa Hudson MRN: [id number] Date of Birth: 1941-06-25  [facility] CM/SW Contact  Lauraine FORBES Saa, LCSWA Phone Number: 09/25/2024, 12:19 PM  Clinical Narrative:     12:19 PM MD informed medical team that patient is not yet medically ready for discharge. PT made medical team aware that they will attempt to work with patient today. [facility] informed CSW that they need updated therapy notes for pending SNF insurance authorization and could admit Friday if patient is medically ready and SNF insurance authorization is approved. CSW made medical team aware. CSW will continue to follow.  Expected Discharge Plan: Skilled Nursing Facility Barriers to Discharge: English As A Second Language Teacher, Continued Medical Work up               Expected Discharge Plan and Services In-house Referral: Clinical Social Work Discharge Planning Services: [employer] Consult Post Acute Care Choice: Skilled Nursing Facility Living arrangements for the past 2 months: Single Family Home                                       Social Drivers of Health (SDOH) Interventions SDOH Screenings   Food Insecurity: No Food Insecurity (09/19/2024)  Housing: Low Risk (09/19/2024)  Transportation Needs: No Transportation Needs (09/19/2024)  Utilities: Not At Risk (09/19/2024)  Social Connections: Socially Integrated (09/19/2024)  Tobacco Use: Low Risk (09/20/2024)    Readmission Risk Interventions     No data to display

## 2024-09-25 NOTE — Progress Notes (Signed)
 Patient had increased RR and CXR was obtained. She appears to have increased edema and pleural effusions. Plan to give a dose of IV Lasix .

## 2024-09-26 DIAGNOSIS — I480 Paroxysmal atrial fibrillation: Secondary | ICD-10-CM | POA: Diagnosis not present

## 2024-09-26 DIAGNOSIS — R0603 Acute respiratory distress: Secondary | ICD-10-CM | POA: Diagnosis not present

## 2024-09-26 DIAGNOSIS — I214 Non-ST elevation (NSTEMI) myocardial infarction: Secondary | ICD-10-CM | POA: Diagnosis not present

## 2024-09-26 LAB — COMPREHENSIVE METABOLIC PANEL WITH GFR
ALT: 7 U/L (ref 0–44)
AST: 14 U/L — ABNORMAL LOW (ref 15–41)
Albumin: 3.1 g/dL — ABNORMAL LOW (ref 3.5–5.0)
Alkaline Phosphatase: 66 U/L (ref 38–126)
Anion gap: 12 (ref 5–15)
BUN: 71 mg/dL — ABNORMAL HIGH (ref 8–23)
CO2: 28 mmol/L (ref 22–32)
Calcium: 8.7 mg/dL — ABNORMAL LOW (ref 8.9–10.3)
Chloride: 93 mmol/L — ABNORMAL LOW (ref 98–111)
Creatinine, Ser: 1.9 mg/dL — ABNORMAL HIGH (ref 0.44–1.00)
GFR, Estimated: 25 mL/min — ABNORMAL LOW
Glucose, Bld: 120 mg/dL — ABNORMAL HIGH (ref 70–99)
Potassium: 3.9 mmol/L (ref 3.5–5.1)
Sodium: 132 mmol/L — ABNORMAL LOW (ref 135–145)
Total Bilirubin: 0.9 mg/dL (ref 0.0–1.2)
Total Protein: 6.3 g/dL — ABNORMAL LOW (ref 6.5–8.1)

## 2024-09-26 LAB — CBC
HCT: 25.4 % — ABNORMAL LOW (ref 36.0–46.0)
Hemoglobin: 8.3 g/dL — ABNORMAL LOW (ref 12.0–15.0)
MCH: 28 pg (ref 26.0–34.0)
MCHC: 32.7 g/dL (ref 30.0–36.0)
MCV: 85.8 fL (ref 80.0–100.0)
Platelets: 190 K/uL (ref 150–400)
RBC: 2.96 MIL/uL — ABNORMAL LOW (ref 3.87–5.11)
RDW: 14.9 % (ref 11.5–15.5)
WBC: 15.8 K/uL — ABNORMAL HIGH (ref 4.0–10.5)
nRBC: 0.1 % (ref 0.0–0.2)

## 2024-09-26 LAB — MAGNESIUM: Magnesium: 2.1 mg/dL (ref 1.7–2.4)

## 2024-09-26 MED ORDER — FUROSEMIDE 10 MG/ML IJ SOLN
80.0000 mg | Freq: Two times a day (BID) | INTRAMUSCULAR | Status: AC
  Administered 2024-09-26 – 2024-09-27 (×2): 80 mg via INTRAVENOUS
  Filled 2024-09-26 (×2): qty 8

## 2024-09-26 NOTE — Plan of Care (Signed)
" °  Problem: Nutrition: Goal: Adequate nutrition will be maintained Outcome: Progressing   Problem: Coping: Goal: Level of anxiety will decrease Outcome: Progressing   Problem: Elimination: Goal: Will not experience complications related to bowel motility Outcome: Progressing Goal: Will not experience complications related to urinary retention Outcome: Progressing   Problem: Pain Managment: Goal: General experience of comfort will improve and/or be controlled Outcome: Progressing   Problem: Safety: Goal: Ability to remain free from injury will improve Outcome: Progressing   Problem: Activity: Goal: Ability to return to baseline activity level will improve Outcome: Progressing   "

## 2024-09-26 NOTE — Progress Notes (Signed)
 "  Progress Note  Patient Name: Alyssa Hudson Date of Encounter: 09/26/2024 Primary Cardiologist: Jennifer JONELLE Crape, MD   Subjective   Alyssa Hudson is an 84 year old with atrial fibrillation and NSTEMI who presents with shortness of breath and a fall complicated by: NSTEMI- LAD and LCX intervention Mixed picture shock- ABX, intubation, LVEDP 21 mm HG AF RVR- IV amiodarone  Extubation Repeat AF prior to re consultation  She has been eating well and engaging in some physical activity, such as lifting her legs.  Improved symptoms today.  Nephew is at bedside.  Vital Signs    Vitals:   09/26/24 0200 09/26/24 0300 09/26/24 0600 09/26/24 0700  BP: (!) 120/45 (!) 118/45  (!) 119/46  Pulse: 78 76  77  Resp: (!) 30 (!) 29  20  Temp:    97.6 F (36.4 C)  TempSrc:    Oral  SpO2: 99% 100%  100%  Weight:   70.8 kg   Height:        Intake/Output Summary (Last 24 hours) at 09/26/2024 0752 Last data filed at 09/26/2024 0600 Gross per 24 hour  Intake 360 ml  Output 850 ml  Net -490 ml   Filed Weights   09/24/24 0500 09/25/24 0655 09/26/24 0600  Weight: 70.7 kg 70.2 kg 70.8 kg    Physical Exam   GEN: No acute distress.  Elderly female Neck: No JVD Cardiac: RRR, no murmurs, rubs, or gallops.  Respiratory: Clear to auscultation bilaterally. GI: Soft, nontender, non-distended  MS: bilateral edema left leg thigh hematoma  Labs   Telemetry: SR FAV   Chemistry Recent Labs  Lab 09/24/24 1059 09/25/24 0253 09/26/24 0235  NA 134* 133* 132*  K 4.4 4.6 3.9  CL 95* 95* 93*  CO2 26 26 28   GLUCOSE 139* 131* 120*  BUN 54* 58* 71*  CREATININE 1.87* 1.88* 1.90*  CALCIUM  8.9 8.8* 8.7*  PROT 6.5  --  6.3*  ALBUMIN 3.2*  --  3.1*  AST <10*  --  14*  ALT 7  --  7  ALKPHOS 67  --  66  BILITOT 0.8  --  0.9  GFRNONAA 26* 26* 25*  ANIONGAP 13 13 12      Hematology Recent Labs  Lab 09/24/24 1059 09/25/24 0253 09/26/24 0235  WBC 17.1* 19.1* 15.8*  RBC 3.07* 3.23* 2.96*  HGB 8.6*  9.2* 8.3*  HCT 26.5* 28.4* 25.4*  MCV 86.3 87.9 85.8  MCH 28.0 28.5 28.0  MCHC 32.5 32.4 32.7  RDW 14.8 14.7 14.9  PLT 196 184 190    Cardiac Enzymes  Recent Labs  Lab 09/14/24 1141 09/14/24 1341 09/14/24 1450 09/15/24 1218 09/15/24 1555  TRNPT 198* 254* 235* 331* 742*     BNP Recent Labs  Lab 09/23/24 1815  PROBNP >35,000.0*    Cardiac Studies   Cardiac Studies & Procedures   ______________________________________________________________________________________________ CARDIAC CATHETERIZATION  CARDIAC CATHETERIZATION 09/15/2024  Conclusion   Mid LM to Dist LM lesion is 30% stenosed.   Prox LAD to Mid LAD lesion is 90% stenosed.   Prox Cx to Mid Cx lesion is 95% stenosed.   Dist Cx lesion is 80% stenosed.   1st Diag-2 lesion is 90% stenosed.   1st Diag-1 lesion is 80% stenosed.   Prox RCA lesion is 50% stenosed.   Mid RCA lesion is 85% stenosed.   1st Mrg lesion is 50% stenosed.   A drug-eluting stent was successfully placed using a STENT SYNERGY XD 2.50X20.  A drug-eluting stent was successfully placed using a STENT SYNERGY XD [id number].   Post intervention, there is a 0% residual stenosis.   Post intervention, there is a 0% residual stenosis.   LV end diastolic pressure is moderately elevated.   Recommend uninterrupted dual antiplatelet therapy with Aspirin  81mg  daily and Clopidogrel  75mg  daily for a minimum of 12 months (ACS-Class I recommendation).  1.  Patent left main with mild nonobstructive distal vessel stenosis 2.  Severe proximal LAD stenosis, treated successfully with a 2.5 x 20 mm Synergy DES 3.  Severe proximal circumflex stenosis treated successfully with overlapping 2.75 x 24 mm and 3.0 x 16 mm Synergy DES 4.  Severe mid to distal RCA stenosis and moderate diffuse proximal to mid RCA stenosis 5.  Severe residual stenosis in a large diagonal/intermediate branch and distal circumflex 6.  Moderately elevated LVEDP of 21 mmHg  Patient remained  stable through two-vessel PCI treating the proximal LAD and proximal circumflex with hopes to reduce her ischemic burden.  A total of 110 cc of contrast are used for the procedure.  She Hudson be monitored carefully in the CV-ICU.  Favor medical therapy for her residual CAD.  Orders written to continue cangrelor  infusion for a total of 2 hours, then load clopidogrel  600 mg orally at the time of cangrelor  discontinuation.  Hold on aggressive diuresis as long as her respiratory status is stable. Follow creatinine with morning labs.  Findings Coronary Findings Diagnostic  Dominance: Right  Left Main Left main is patent with mild nonobstructive distal stenosis Mid LM to Dist LM lesion is 30% stenosed.  Left Anterior Descending The LAD is patent with severe proximal stenosis of 90%.  There is a large diagonal/intermediate with diffuse 80% ostial proximal stenosis and 90% mid stenosis.  The distal LAD is patent with mild diffuse disease and it wraps around the LV apex. Prox LAD to Mid LAD lesion is 90% stenosed. The lesion is calcified.  First Diagonal Branch 1st Diag-1 lesion is 80% stenosed. 1st Diag-2 lesion is 90% stenosed.  Left Circumflex Prox Cx to Mid Cx lesion is 95% stenosed. The lesion is calcified. Dist Cx lesion is 80% stenosed.  First Obtuse Marginal Branch 1st Mrg lesion is 50% stenosed.  Second Obtuse Marginal Branch The proximal circumflex is tortuous.  Just after a big proximal bend there is a 95% stenosis and the distal circumflex has 80 to 90% stenosis.  Right Coronary Artery Large, dominant RCA.  The vessel is diffusely diseased with diffuse proximal to mid 50% stenosis followed by an eccentric 85% lesion in the mid vessel.  The PDA and PLA branches are patent. Prox RCA lesion is 50% stenosed. Mid RCA lesion is 85% stenosed.  Intervention  Prox LAD to Mid LAD lesion Stent Pre-stent angioplasty was performed using a BALLOON EMERGE MR 2.5X15. A drug-eluting stent was  successfully placed using a STENT SYNERGY XD 2.50X20. Post-stent angioplasty was performed using a BALLOON [place] EMERGE MR [id number]. Post-Intervention Lesion Assessment The intervention was successful. Pre-interventional TIMI flow is 3. Post-intervention TIMI flow is 3. No complications occurred at this lesion. There is a 0% residual stenosis post intervention.  Prox Cx to Mid Cx lesion Stent Pre-stent angioplasty was performed using a BALLOON EMERGE MR 2.5X12. A drug-eluting stent was successfully placed using a STENT SYNERGY XD [id number]. Post-stent angioplasty was performed using a BALLOON [place] EMERGE MR 3.25X20. Maximum pressure:  16 atm. Post-Intervention Lesion Assessment The intervention was successful. Pre-interventional TIMI flow is 3. Post-intervention TIMI  flow is 3. No complications occurred at this lesion. There is a 0% residual stenosis post intervention.     ECHOCARDIOGRAM  ECHOCARDIOGRAM COMPLETE 09/14/2024  Narrative ECHOCARDIOGRAM REPORT    Patient Name:   TOMASA DOBRANSKY Date of Exam: 09/14/2024 Medical Rec #:  [id number]       Height:       66.0 in Accession #:    [id number]      Weight:       158.1 lb Date of Birth:  07-24-1941       BSA:          1.810 m Patient Age:    83 years        BP:           145/55 mmHg Patient Gender: F               HR:           56 bpm. Exam Location:  Inpatient  Procedure: 2D Echo, Color Doppler, Cardiac Doppler and Intracardiac Opacification Agent (Both Spectral and Color Flow Doppler were utilized during procedure).  Indications:    R94.31 Abnormal EKG  History:        Patient has prior history of Echocardiogram examinations, most recent 06/29/2020. Pulmonary HTN, Arrythmias:Atrial Fibrillation; Risk Factors:Hypertension.  Sonographer:    Damien Senior RDCS Referring Phys: [id number] JOSHUA G LONG   Sonographer Comments: Scanned upright on bipap IMPRESSIONS   1. Left ventricular ejection fraction, by estimation, is 55 to 60%.  Left ventricular ejection fraction by 2D MOD biplane is 57.2 %. The left ventricle has normal function. The left ventricle has no regional wall motion abnormalities. Left ventricular diastolic parameters are consistent with Grade III diastolic dysfunction (restrictive). Elevated left atrial pressure. 2. Right ventricular systolic function is normal. The right ventricular size is mildly enlarged. There is moderately elevated pulmonary artery systolic pressure. The estimated right ventricular systolic pressure is 50.0 mmHg. 3. Left atrial size was moderately dilated. 4. The mitral valve is degenerative. Mild to moderate mitral valve regurgitation. 5. Tricuspid valve regurgitation is moderate. 6. The aortic valve is calcified. Aortic valve regurgitation is not visualized. Aortic valve sclerosis/calcification is present, without any evidence of aortic stenosis. 7. The inferior vena cava is dilated in size with <50% respiratory variability, suggesting right atrial pressure of 15 mmHg.  Comparison(s): Changes from prior study are noted. EF is similar and TR is now moderate.  FINDINGS Left Ventricle: Left ventricular ejection fraction, by estimation, is 55 to 60%. Left ventricular ejection fraction by 2D MOD biplane is 57.2 %. The left ventricle has normal function. The left ventricle has no regional wall motion abnormalities. Definity  contrast agent was given IV to delineate the left ventricular endocardial borders. The left ventricular internal cavity size was normal in size. There is no left ventricular hypertrophy. Left ventricular diastolic function could not be evaluated due to mitral annular calcification (moderate or greater). Left ventricular diastolic parameters are consistent with Grade III diastolic dysfunction (restrictive). Elevated left atrial pressure.  Right Ventricle: The right ventricular size is mildly enlarged. No increase in right ventricular wall thickness. Right ventricular systolic  function is normal. There is moderately elevated pulmonary artery systolic pressure. The tricuspid regurgitant velocity is 3.24 m/s, and with an assumed right atrial pressure of 8 mmHg, the estimated right ventricular systolic pressure is 50.0 mmHg.  Left Atrium: Left atrial size was moderately dilated.  Right Atrium: Right atrial size was normal in size.  Pericardium: There is no evidence  of pericardial effusion.  Mitral Valve: The mitral valve is degenerative in appearance. Mild to moderate mitral annular calcification. Mild to moderate mitral valve regurgitation. MV peak gradient, 15.8 mmHg. The mean mitral valve gradient is 3.0 mmHg.  Tricuspid Valve: The tricuspid valve is normal in structure. Tricuspid valve regurgitation is moderate . No evidence of tricuspid stenosis.  Aortic Valve: The aortic valve is calcified. Aortic valve regurgitation is not visualized. Aortic valve sclerosis/calcification is present, without any evidence of aortic stenosis.  Pulmonic Valve: The pulmonic valve was not well visualized. Pulmonic valve regurgitation is not visualized.  Aorta: The aortic root and ascending aorta are structurally normal, with no evidence of dilitation.  Venous: The inferior vena cava is dilated in size with less than 50% respiratory variability, suggesting right atrial pressure of 15 mmHg.  IAS/Shunts: No atrial level shunt detected by color flow Doppler.   LEFT VENTRICLE PLAX 2D                        Biplane EF (MOD) LVIDd:         5.20 cm         LV Biplane EF:   Left LVIDs:         3.40 cm                          ventricular LV PW:         0.80 cm                          ejection LV IVS:        0.90 cm                          fraction by LVOT diam:     1.90 cm                          2D MOD LV SV:         65                               biplane is LV SV Index:   36                               57.2 %. LVOT Area:     2.84 cm Diastology LV e' medial:    12.40  cm/s LV Volumes (MOD)               LV E/e' medial:  14.0 LV vol d, MOD    107.0 ml      LV e' lateral:   13.16 cm/s A2C:                           LV E/e' lateral: 13.2 LV vol d, MOD    108.4 ml A4C: LV vol s, MOD    41.8 ml A2C: LV vol s, MOD    50.2 ml A4C: LV SV MOD A2C:   65.2 ml LV SV MOD A4C:   108.4 ml LV SV MOD BP:    63.4 ml  RIGHT VENTRICLE RV S prime:     19.10 cm/s TAPSE (M-mode): 3.0 cm  LEFT ATRIUM             Index        RIGHT ATRIUM           Index LA diam:        4.10 cm 2.27 cm/m   RA Area:     17.00 cm LA Vol (A2C):   85.7 ml 47.36 ml/m  RA Volume:   50.20 ml  27.74 ml/m LA Vol (A4C):   72.5 ml 40.07 ml/m LA Biplane Vol: 82.3 ml 45.48 ml/m AORTIC VALVE LVOT Vmax:   88.30 cm/s LVOT Vmean:  61.600 cm/s LVOT VTI:    0.229 m  AORTA Ao Root diam: 3.40 cm Ao Asc diam:  3.20 cm  MITRAL VALVE                TRICUSPID VALVE MV Area VTI:  1.12 cm      TR Peak grad:   42.0 mmHg MV Peak grad: 15.8 mmHg     TR Vmax:        324.00 cm/s MV Mean grad: 3.0 mmHg MV Vmax:      1.99 m/s      SHUNTS MV Vmean:     66.3 cm/s     Systemic VTI:  0.23 m MV E velocity: 173.86 cm/s  Systemic Diam: 1.90 cm MV A velocity: 70.27 cm/s MV E/A ratio:  2.47  Franck Azobou Tonleu Electronically signed by Joelle Cedars Tonleu Signature Date/Time: 09/14/2024/3:13:50 PM    Final    MONITORS  LONG TERM MONITOR (3-14 DAYS) 08/28/2024  Narrative Patch Wear Time:  13 days and 21 hours (2025-11-05T15:35:42-0500 to 2025-11-19T12:38:32-0500)  Patient had a min HR of 33 bpm, max HR of 88 bpm, and avg HR of 67 bpm.  Predominant underlying rhythm was Sinus Rhythm.  First Degree AV Block was present. 116 episode(s) of AV Block (2nd Mobitz II) occurred, lasting a total of 27 mins 21 secs. Second Degree AV Block-Mobitz I (Wenckebach) was present. AV Block (2nd Mobitz II) and Wenckebach were detected within +/- 45 seconds of symptomatic patient event(s). Isolated SVEs were rare  (<1.0%), and no SVE Couplets or SVE Triplets were present.  Isolated VEs were rare (<1.0%, 446), VE Couplets were rare (<1.0%, 2), and VE Triplets were rare (<1.0%, 1). Ventricular Bigeminy was present. Difficulty discerning atrial activity making definitive diagnosis difficult to ascertain. MD notification criteria for Second Degree AV Block, Mobitz II met - report posted prior to notification (AP).       ______________________________________________________________________________________________        Assessment & Plan   Non-ST elevation myocardial infarction Recent NSTEMI with PCI to proximal LAD and overlapping PCI to [facility]. Currently on triple therapy with aspirin , Plavix , and Eliquis . No current chest discomfort. - Continue aspirin , Plavix , and Eliquis .  Triple therapy for 30 days the plavix  and eliquis   Paroxysmal atrial fibrillation First Degree HB HX of SVT (at the time of pericarditis) New onset paroxysmal atrial fibrillation with intermittent episodes. Currently in sinus rhythm with first-degree heart block. On Eliquis  for stroke prevention. Amiodarone  therapy adjusted as needed. - Continue Eliquis  for stroke prevention.  - currently PO amiodarone  200 mg PO BID, if AF return, rebolus IV; peri-discharge Hudson set maintenance dosing given intermittent AF  Acute Heart failure with Preserved LVEF Volume overload with elevated LVEDP of 21. Recent improvement in hemodynamics. Currently on IV Lasix  for fluid management. Oxygen therapy increased to 4 liters due to respiratory status. - Administered 80 mg IV  LasixBID still - I had to adjust oxygen therapy at 4 liters on my exam; currently sat's 99%  Acute on chronic kidney disease VS unmaking of CKD Hypnatremia Elevated creatinine levels with baseline creatinine unclear. Suspected chronic kidney disease with potential acute exacerbation due to volume status. No current need for dialysis anticipated. - Monitor kidney function  closely. - Adjust fluid management to balance volume status and kidney function.  Hypertension Managed with previous medications. Current blood pressure relatively low during admission. Home ARB held  Hyperlipidemia - Continue current statin therapy.  Acute hypoxic respiratory failure - elevated WBC and Pneumonia managed by [facility]   For questions or updates, please contact [facility] Please consult [email or web address] for contact info under Cardiology/STEMI.      Stanly Leavens, MD FASE [facility] Cardiologist [facility]  [address] [place], [address] [place], [place] [address] [phone]  7:52 AM     "

## 2024-09-26 NOTE — Progress Notes (Signed)
 Pt was resting comfortably, pt going into HB with HR going down into the 30's, 12-lead EKG obtained, & notified Nasir, MD. Verbal order to hold evening Amiodarone  dose.   Lonell LITTIE Lyme, RN

## 2024-09-26 NOTE — Progress Notes (Signed)
 " PROGRESS NOTE  Llewellyn Schoenberger FMW:[id number] DOB: October 25, 1940 DOA: 09/14/2024 PCP: [name], MD   LOS: 12 days   Brief Narrative / Interim history: 84 year old female with history of aortic stenosis, chronic diastolic CHF, chronic A-fib, CAD, HTN comes into the hospital with cough, shortness of breath.  She was found to have pneumonia, but also had significant troponin elevation and cardiology was consulted.  She underwent a cardiac cath with stent placement to LAD.  Hospital course complicated by flash pulmonary edema having to be intubated, eventually was transferred out of the ICU on 12/25.  She has recurrent flash pulmonary edema on 12/26, and also right-sided large pleural effusion s/p thoracentesis 12/30.  She had recurrent pulmonary edema and shortness of breath overnight 12/30-12/31  Subjective / 24h Interval events: She is overall doing much better this morning.  She tells me her breathing is improved.  She finally was able to get a good night sleep and slept about 10 hours  Assesement and Plan: Principal problem CAD, Non-STEMI -cardiology consulted and followed patient while hospitalized.  Underwent a cardiac catheterization and is now status post PCI to DES to LAD.  She also has severe triple-vessel disease.  Currently she is on Eliquis , aspirin  and Plavix , she will need to come off aspirin  on 10/16/2024 at 30 days after her cath - Continue statin  Active problems Acute on chronic diastolic CHF -patient initially diuresed with IV and transition to p.o. Lasix , however had recurrent pulmonary edema and respiratory distress.  She is back on IV furosemide  feeling much better today.  Cardiology following, appreciate input.  Potentially will transition to p.o. diuretics tomorrow  New onset A-fib -cardiology following, currently on oral amiodarone , Eliquis .  Continue  Cardiogenic shock -was in the ICU briefly on pressors.  Now weaned off and stable on the floor.  Acute hypoxic  respiratory failure -multifactorial in the setting of pneumonia, non-STEMI, flash pulmonary edema.  Remains hypoxic, wean off oxygen as tolerated  Possible pneumonia -patient completed a course of antibiotics with IV Zosyn .  She did have a rise in her white count on 12/31, improved today.  She is afebrile.  Hold further antibiotics  AKI on CKD 3B-creatinine appears to be 1.4 at baseline.  Creatinine peaked at 2.5, likely due to shock.  Creatinine now stable around 1.8-1.9 with ongoing diuresis  Acute blood loss anemia -after an injury and a fall, has significant left leg hematoma.  She is also iron  deficient status post IV iron .  She is status post a unit of packed red blood cells on 12/22  Scheduled Meds:  amiodarone   200 mg Oral BID   apixaban   2.5 mg Oral BID   aspirin  EC  81 mg Oral Daily   atorvastatin   40 mg Oral Daily   benzonatate   100 mg Oral TID   clopidogrel   75 mg Oral Q breakfast   feeding supplement  237 mL Oral BID BM   furosemide   80 mg Intravenous BID   guaiFENesin   600 mg Oral BID   isosorbide  mononitrate  30 mg Oral Daily   pantoprazole   40 mg Oral QHS   sodium chloride  flush  3 mL Intravenous Q12H   Continuous Infusions: PRN Meds:.acetaminophen , ALPRAZolam , hydrALAZINE , ipratropium-albuterol , labetalol , ondansetron  (ZOFRAN ) IV, mouth rinse, sodium chloride  flush  Current Outpatient Medications  Medication Instructions   amLODipine  (NORVASC ) 10 mg, Oral, Daily   aspirin  EC 81 mg, Oral, Daily, Swallow whole.   aspirin  EC 325 mg, Oral, Daily PRN  Biotin 5,000 mcg, Oral, Daily   CRANBERRY PO 650 mg, Oral, Daily   furosemide  (LASIX ) 40 mg, Oral, Daily, Not taking on therapy days   hydrochlorothiazide  (HYDRODIURIL ) 25 mg, Oral, Daily, Takes only on therapy days.   losartan  (COZAAR ) 100 mg, Oral, Daily   MAGNESIUM  PO 400 mg, Daily   metoprolol  succinate (TOPROL -XL) 50 mg, Oral, Daily   OVER THE COUNTER MEDICATION 4 capsules, Daily   Probiotic Product (PROBIOTIC PO)  1 tablet, Oral, Daily    Diet Orders (From admission, onward)     Start     Ordered   09/20/24 0908  Diet Heart Room service appropriate? Yes; Fluid consistency: Thin  Diet effective now       Question Answer Comment  Room service appropriate? Yes   Fluid consistency: Thin      09/20/24 0908            DVT prophylaxis: apixaban  (ELIQUIS ) tablet 2.5 mg Start: 09/17/24 1615 apixaban  (ELIQUIS ) tablet 2.5 mg   Lab Results  Component Value Date   PLT 190 09/26/2024      Code Status: Do not attempt resuscitation (DNR) PRE-ARREST INTERVENTIONS DESIRED  Family Communication: Nephew at bedside  Status is: Inpatient Remains inpatient appropriate because: Severity of illness   Level of care: Progressive  Consultants:  Cardiology  Objective: Vitals:   09/26/24 0200 09/26/24 0300 09/26/24 0600 09/26/24 0700  BP: (!) 120/45 (!) 118/45  (!) 119/46  Pulse: 78 76  77  Resp: (!) 30 (!) 29  20  Temp:    97.6 F (36.4 C)  TempSrc:    Oral  SpO2: 99% 100%  100%  Weight:   70.8 kg   Height:        Intake/Output Summary (Last 24 hours) at 09/26/2024 1139 Last data filed at 09/26/2024 0600 Gross per 24 hour  Intake 240 ml  Output 850 ml  Net -610 ml   Wt Readings from Last 3 Encounters:  09/26/24 70.8 kg  07/31/24 72.9 kg  07/28/23 74.1 kg    Examination:  Constitutional: NAD Eyes: lids and conjunctivae normal, no scleral icterus ENMT: mmm Neck: normal, supple Respiratory: Faint bibasilar crackles, no wheezing heard Cardiovascular: Regular rate and rhythm, no murmurs / rubs / gallops.  Trace LE edema. Abdomen: soft, no distention, no tenderness. Bowel sounds positive.   Data Reviewed: I have independently reviewed following labs and imaging studies   CBC Recent Labs  Lab 09/22/24 0146 09/23/24 1815 09/24/24 1059 09/25/24 0253 09/26/24 0235  WBC 13.4* 17.8* 17.1* 19.1* 15.8*  HGB 8.4* 8.3* 8.6* 9.2* 8.3*  HCT 26.4* 26.3* 26.5* 28.4* 25.4*  PLT 168 196 196  184 190  MCV 88.3 89.2 86.3 87.9 85.8  MCH 28.1 28.1 28.0 28.5 28.0  MCHC 31.8 31.6 32.5 32.4 32.7  RDW 15.0 14.8 14.8 14.7 14.9  LYMPHSABS 3.1 2.2  --   --   --   MONOABS 1.5* 2.8*  --   --   --   EOSABS 0.0 0.0  --   --   --   BASOSABS 0.0 0.1  --   --   --     Recent Labs  Lab 09/20/24 0228 09/21/24 0303 09/22/24 0146 09/23/24 0144 09/23/24 1815 09/24/24 1059 09/25/24 0253 09/25/24 1342 09/26/24 0235  NA 140 137 138 135  --  134* 133*  --  132*  K 3.6 4.0 3.6 4.2  --  4.4 4.6  --  3.9  CL 101 100 99 96*  --  95* 95*  --  93*  CO2 26 22 27 26   --  26 26  --  28  GLUCOSE 127* 141* 137* 150*  --  139* 131*  --  120*  BUN 49* 44* 42* 45*  --  54* 58*  --  71*  CREATININE 1.64* 1.55* 1.64* 1.83*  --  1.87* 1.88*  --  1.90*  CALCIUM  8.5* 8.8* 8.7* 8.9  --  8.9 8.8*  --  8.7*  AST  --   --   --   --   --  <10*  --   --  14*  ALT  --   --   --   --   --  7  --   --  7  ALKPHOS  --   --   --   --   --  67  --   --  66  BILITOT  --   --   --   --   --  0.8  --   --  0.9  ALBUMIN  --   --   --   --   --  3.2*  --   --  3.1*  MG 2.0 1.8 1.7  --   --   --  2.1  --  2.1  CRP  --   --   --   --  6.0*  --   --   --   --   PROCALCITON  --   --   --   --  0.25  --   --   --   --   LATICACIDVEN  --   --   --   --   --   --   --  2.1*  --     ------------------------------------------------------------------------------------------------------------------ No results for input(s): CHOL, HDL, LDLCALC, TRIG, CHOLHDL, LDLDIRECT in the last 72 hours.  Lab Results  Component Value Date   HGBA1C 5.4 11/18/2020   ------------------------------------------------------------------------------------------------------------------ No results for input(s): TSH, T4TOTAL, T3FREE, THYROIDAB in the last 72 hours.  Invalid input(s): FREET3  Cardiac Enzymes No results for input(s): CKMB, TROPONINI, MYOGLOBIN in the last 168 hours.  Invalid input(s):  CK  ------------------------------------------------------------------------------------------------------------------    Component Value Date/Time   BNP 165.4 (H) 06/29/2020 1348    CBG: Recent Labs  Lab 09/20/24 2313 09/22/24 2045  GLUCAP 155* 151*    Recent Results (from the past 240 hours)  Respiratory (~20 pathogens) panel by PCR     Status: None   Collection Time: 09/16/24  2:06 PM   Specimen: Nasopharyngeal Swab; Respiratory  Result Value Ref Range Status   Adenovirus NOT DETECTED NOT DETECTED Final   Coronavirus 229E NOT DETECTED NOT DETECTED Final    Comment: (NOTE) The Coronavirus on the Respiratory Panel, DOES NOT test for the novel  Coronavirus (2019 nCoV)    Coronavirus HKU1 NOT DETECTED NOT DETECTED Final   Coronavirus NL63 NOT DETECTED NOT DETECTED Final   Coronavirus OC43 NOT DETECTED NOT DETECTED Final   Metapneumovirus NOT DETECTED NOT DETECTED Final   Rhinovirus / Enterovirus NOT DETECTED NOT DETECTED Final   Influenza A NOT DETECTED NOT DETECTED Final   Influenza B NOT DETECTED NOT DETECTED Final   Parainfluenza Virus 1 NOT DETECTED NOT DETECTED Final   Parainfluenza Virus 2 NOT DETECTED NOT DETECTED Final   Parainfluenza Virus 3 NOT DETECTED NOT DETECTED Final   Parainfluenza Virus 4 NOT DETECTED NOT DETECTED Final   Respiratory Syncytial Virus NOT DETECTED  NOT DETECTED Final   Bordetella pertussis NOT DETECTED NOT DETECTED Final   Bordetella Parapertussis NOT DETECTED NOT DETECTED Final   Chlamydophila pneumoniae NOT DETECTED NOT DETECTED Final   Mycoplasma pneumoniae NOT DETECTED NOT DETECTED Final    Comment: Performed at [facility], [address], [place], [place] [address]  Gram stain     Status: None   Collection Time: 09/24/24  3:27 PM   Specimen: Lung, Right; Pleural Fluid  Result Value Ref Range Status   Specimen Description PLEURAL  Final   Special Requests LUNG RIGHT  Final   Gram Stain   Final    RARE WBC PRESENT,  PREDOMINANTLY MONONUCLEAR NO ORGANISMS SEEN Performed at [facility], [address], [place], [place] [address]    Report Status 09/24/2024 FINAL  Final  Culture, body fluid w Gram Stain-bottle     Status: None (Preliminary result)   Collection Time: 09/24/24  3:27 PM   Specimen: Pleura  Result Value Ref Range Status   Specimen Description PLEURAL  Final   Special Requests LUNG RIGHT  Final   Culture   Final    NO GROWTH 2 DAYS Performed at [facility], [address] [place], [place] [address]    Report Status PENDING  Incomplete     Radiology Studies: No results found.    Nilda Fendt, MD, PhD [facility]  Between 7 am - 7 pm I am available, please contact me via Amion (for emergencies) or Securechat (non urgent messages)  Between 7 pm - 7 am I am not available, please contact night coverage MD/APP via Amion  "

## 2024-09-26 NOTE — TOC Progression Note (Addendum)
 Transition of Care [facility]) - Progression Note    Patient Details  Name: Alyssa Hudson MRN: [id number] Date of Birth: 01/31/1941  [facility] CM/SW Contact  Arlana JINNY Nicholaus [place] Phone Number: [phone]  09/26/2024, 9:39 AM  Clinical Narrative:   CSW submitted auth id: [id number] for [facility]. Previous auth id: [id number] was withdrawn. Auth pending. Will update status once it changes.   CSW received a call from [facility] and stated that the facility withdrew the patients auth because she was not medically ready for dc. [employer] is reviewing and notified that the potential dc plan is 1/2.   ICM will continue to follow and monitor for dc readiness.     Expected Discharge Plan: Skilled Nursing Facility Barriers to Discharge: English As A Second Language Teacher, Continued Medical Work up               Expected Discharge Plan and Services In-house Referral: Clinical Social Work Discharge Planning Services: [employer] Consult Post Acute Care Choice: Skilled Nursing Facility Living arrangements for the past 2 months: Single Family Home                                       Social Drivers of Health (SDOH) Interventions SDOH Screenings   Food Insecurity: No Food Insecurity (09/19/2024)  Housing: Low Risk (09/19/2024)  Transportation Needs: No Transportation Needs (09/19/2024)  Utilities: Not At Risk (09/19/2024)  Social Connections: Socially Integrated (09/19/2024)  Tobacco Use: Low Risk (09/20/2024)    Readmission Risk Interventions     No data to display

## 2024-09-27 ENCOUNTER — Inpatient Hospital Stay (HOSPITAL_COMMUNITY)

## 2024-09-27 DIAGNOSIS — R0603 Acute respiratory distress: Secondary | ICD-10-CM | POA: Diagnosis not present

## 2024-09-27 LAB — CBC
HCT: 25.8 % — ABNORMAL LOW (ref 36.0–46.0)
Hemoglobin: 8.2 g/dL — ABNORMAL LOW (ref 12.0–15.0)
MCH: 27.9 pg (ref 26.0–34.0)
MCHC: 31.8 g/dL (ref 30.0–36.0)
MCV: 87.8 fL (ref 80.0–100.0)
Platelets: 157 K/uL (ref 150–400)
RBC: 2.94 MIL/uL — ABNORMAL LOW (ref 3.87–5.11)
RDW: 14.9 % (ref 11.5–15.5)
WBC: 15.4 K/uL — ABNORMAL HIGH (ref 4.0–10.5)
nRBC: 0 % (ref 0.0–0.2)

## 2024-09-27 LAB — BASIC METABOLIC PANEL WITH GFR
Anion gap: 12 (ref 5–15)
BUN: 68 mg/dL — ABNORMAL HIGH (ref 8–23)
CO2: 29 mmol/L (ref 22–32)
Calcium: 8.6 mg/dL — ABNORMAL LOW (ref 8.9–10.3)
Chloride: 95 mmol/L — ABNORMAL LOW (ref 98–111)
Creatinine, Ser: 1.88 mg/dL — ABNORMAL HIGH (ref 0.44–1.00)
GFR, Estimated: 26 mL/min — ABNORMAL LOW
Glucose, Bld: 116 mg/dL — ABNORMAL HIGH (ref 70–99)
Potassium: 3 mmol/L — ABNORMAL LOW (ref 3.5–5.1)
Sodium: 136 mmol/L (ref 135–145)

## 2024-09-27 LAB — MAGNESIUM: Magnesium: 1.9 mg/dL (ref 1.7–2.4)

## 2024-09-27 MED ORDER — POTASSIUM CHLORIDE CRYS ER 20 MEQ PO TBCR
40.0000 meq | EXTENDED_RELEASE_TABLET | ORAL | Status: AC
  Administered 2024-09-27 (×2): 40 meq via ORAL
  Filled 2024-09-27 (×2): qty 2

## 2024-09-27 MED ORDER — TORSEMIDE 20 MG PO TABS
40.0000 mg | ORAL_TABLET | Freq: Every day | ORAL | Status: DC
  Administered 2024-09-28 – 2024-09-30 (×3): 40 mg via ORAL
  Filled 2024-09-27 (×3): qty 2

## 2024-09-27 NOTE — Progress Notes (Signed)
 " PROGRESS NOTE  Alyssa Hudson FMW:[id number] DOB: January 14, 1941 DOA: 09/14/2024 PCP: [name], MD   LOS: 13 days   Brief Narrative / Interim history: 84 year old female with history of aortic stenosis, chronic diastolic CHF, chronic A-fib, CAD, HTN comes into the hospital with cough, shortness of breath.  She was found to have pneumonia, but also had significant troponin elevation and cardiology was consulted.  She underwent a cardiac cath with stent placement to LAD.  Hospital course complicated by flash pulmonary edema having to be intubated, eventually was transferred out of the ICU on 12/25.  She has recurrent flash pulmonary edema on 12/26, and also right-sided large pleural effusion s/p thoracentesis 12/30.  She had recurrent pulmonary edema and shortness of breath overnight 12/30-12/31  Subjective / 24h Interval events: She continues to do very well today.  She slept well last night, this is now her second night that she sleeps well.  She responded well to IV diuresis with stable renal function.  She denies any chest pain, denies any significant shortness of breath  Assesement and Plan: Principal problem CAD, Non-STEMI -cardiology consulted and followed patient while hospitalized.  Underwent a cardiac catheterization and is now status post PCI to DES to LAD.  She also has severe triple-vessel disease.  Currently she is on Eliquis , aspirin  and Plavix , she will need to come off aspirin  on 10/16/2024 at 30 days after her cath - Continue statin  Active problems Acute on chronic diastolic CHF -patient initially diuresed with IV and transition to p.o. Lasix , however had recurrent pulmonary edema and respiratory distress.  She was placed back on IV furosemide  again with improvement in her respiratory status.  She received 80 mg of IV Lasix  this morning.  Discussed with cardiology team, will try to convert to a slightly different oral regimen that would prevent her to get fluid overload  again.  Obtain a 2D chest x-ray, will attempt torsemide and metolazone tomorrow.  New onset A-fib -cardiology following, currently on oral amiodarone , Eliquis .  Continue  Cardiogenic shock -was in the ICU briefly on pressors.  Now weaned off and stable on the floor.  Acute hypoxic respiratory failure -multifactorial in the setting of pneumonia, non-STEMI, flash pulmonary edema.  Remains hypoxic, wean off oxygen as tolerated  Possible pneumonia -patient completed a course of antibiotics with IV Zosyn .  She did have a rise in her white count on 12/31, improved today.  She is afebrile.  Hold further antibiotics  AKI on CKD 3B-creatinine appears to be 1.4 at baseline.  Creatinine peaked at 2.5, likely due to shock.  Creatinine now stable around 1.8-1.9 with ongoing diuresis, but overall stable and without large fluctuations  Acute blood loss anemia -after an injury and a fall, has significant left leg hematoma.  She is also iron  deficient status post IV iron .  She is status post a unit of packed red blood cells on 12/22  Scheduled Meds:  amiodarone   200 mg Oral BID   apixaban   2.5 mg Oral BID   aspirin  EC  81 mg Oral Daily   atorvastatin   40 mg Oral Daily   benzonatate   100 mg Oral TID   clopidogrel   75 mg Oral Q breakfast   feeding supplement  237 mL Oral BID BM   guaiFENesin   600 mg Oral BID   isosorbide  mononitrate  30 mg Oral Daily   pantoprazole   40 mg Oral QHS   sodium chloride  flush  3 mL Intravenous Q12H   [START ON  09/28/2024] torsemide  40 mg Oral Daily   Continuous Infusions: PRN Meds:.acetaminophen , ALPRAZolam , hydrALAZINE , ipratropium-albuterol , labetalol , ondansetron  (ZOFRAN ) IV, mouth rinse, sodium chloride  flush  Current Outpatient Medications  Medication Instructions   amLODipine  (NORVASC ) 10 mg, Oral, Daily   aspirin  EC 81 mg, Oral, Daily, Swallow whole.   aspirin  EC 325 mg, Oral, Daily PRN   Biotin 5,000 mcg, Oral, Daily   CRANBERRY PO 650 mg, Oral, Daily    furosemide  (LASIX ) 40 mg, Oral, Daily, Not taking on therapy days   hydrochlorothiazide  (HYDRODIURIL ) 25 mg, Oral, Daily, Takes only on therapy days.   losartan  (COZAAR ) 100 mg, Oral, Daily   MAGNESIUM  PO 400 mg, Daily   metoprolol  succinate (TOPROL -XL) 50 mg, Oral, Daily   OVER THE COUNTER MEDICATION 4 capsules, Daily   Probiotic Product (PROBIOTIC PO) 1 tablet, Oral, Daily    Diet Orders (From admission, onward)     Start     Ordered   09/20/24 0908  Diet Heart Room service appropriate? Yes; Fluid consistency: Thin  Diet effective now       Question Answer Comment  Room service appropriate? Yes   Fluid consistency: Thin      09/20/24 0908            DVT prophylaxis: apixaban  (ELIQUIS ) tablet 2.5 mg Start: 09/17/24 1615 apixaban  (ELIQUIS ) tablet 2.5 mg   Lab Results  Component Value Date   PLT 157 09/27/2024      Code Status: Do not attempt resuscitation (DNR) PRE-ARREST INTERVENTIONS DESIRED  Family Communication: Nephew at bedside  Status is: Inpatient Remains inpatient appropriate because: Severity of illness   Level of care: Progressive  Consultants:  Cardiology  Objective: Vitals:   09/27/24 0500 09/27/24 0729 09/27/24 0900 09/27/24 1146  BP:  137/76  (!) 125/48  Pulse: (!) 47 60  65  Resp: 20 20  20   Temp:  98.4 F (36.9 C)  98 F (36.7 C)  TempSrc:  Oral  Oral  SpO2: 98% 97% 92% 97%  Weight:      Height:        Intake/Output Summary (Last 24 hours) at 09/27/2024 1454 Last data filed at 09/27/2024 0455 Gross per 24 hour  Intake 240 ml  Output 850 ml  Net -610 ml   Wt Readings from Last 3 Encounters:  09/27/24 70.1 kg  07/31/24 72.9 kg  07/28/23 74.1 kg    Examination:  Constitutional: NAD Eyes: lids and conjunctivae normal, no scleral icterus ENMT: mmm Neck: normal, supple Respiratory: Few scattered coarse breath sounds at the bases, no wheezing or crackles heart Cardiovascular: Regular rate and rhythm, no murmurs / rubs / gallops.   Trace LE edema. Abdomen: soft, no distention, no tenderness. Bowel sounds positive.   Data Reviewed: I have independently reviewed following labs and imaging studies   CBC Recent Labs  Lab 09/22/24 0146 09/23/24 1815 09/24/24 1059 09/25/24 0253 09/26/24 0235 09/27/24 0214  WBC 13.4* 17.8* 17.1* 19.1* 15.8* 15.4*  HGB 8.4* 8.3* 8.6* 9.2* 8.3* 8.2*  HCT 26.4* 26.3* 26.5* 28.4* 25.4* 25.8*  PLT 168 196 196 184 190 157  MCV 88.3 89.2 86.3 87.9 85.8 87.8  MCH 28.1 28.1 28.0 28.5 28.0 27.9  MCHC 31.8 31.6 32.5 32.4 32.7 31.8  RDW 15.0 14.8 14.8 14.7 14.9 14.9  LYMPHSABS 3.1 2.2  --   --   --   --   MONOABS 1.5* 2.8*  --   --   --   --   EOSABS 0.0 0.0  --   --   --   --  BASOSABS 0.0 0.1  --   --   --   --     Recent Labs  Lab 09/21/24 0303 09/22/24 0146 09/23/24 0144 09/23/24 1815 09/24/24 1059 09/25/24 0253 09/25/24 1342 09/26/24 0235 09/27/24 0214  NA 137 138 135  --  134* 133*  --  132* 136  K 4.0 3.6 4.2  --  4.4 4.6  --  3.9 3.0*  CL 100 99 96*  --  95* 95*  --  93* 95*  CO2 22 27 26   --  26 26  --  28 29  GLUCOSE 141* 137* 150*  --  139* 131*  --  120* 116*  BUN 44* 42* 45*  --  54* 58*  --  71* 68*  CREATININE 1.55* 1.64* 1.83*  --  1.87* 1.88*  --  1.90* 1.88*  CALCIUM  8.8* 8.7* 8.9  --  8.9 8.8*  --  8.7* 8.6*  AST  --   --   --   --  <10*  --   --  14*  --   ALT  --   --   --   --  7  --   --  7  --   ALKPHOS  --   --   --   --  67  --   --  66  --   BILITOT  --   --   --   --  0.8  --   --  0.9  --   ALBUMIN  --   --   --   --  3.2*  --   --  3.1*  --   MG 1.8 1.7  --   --   --  2.1  --  2.1 1.9  CRP  --   --   --  6.0*  --   --   --   --   --   PROCALCITON  --   --   --  0.25  --   --   --   --   --   LATICACIDVEN  --   --   --   --   --   --  2.1*  --   --     ------------------------------------------------------------------------------------------------------------------ No results for input(s): CHOL, HDL, LDLCALC, TRIG, CHOLHDL,  LDLDIRECT in the last 72 hours.  Lab Results  Component Value Date   HGBA1C 5.4 11/18/2020   ------------------------------------------------------------------------------------------------------------------ No results for input(s): TSH, T4TOTAL, T3FREE, THYROIDAB in the last 72 hours.  Invalid input(s): FREET3  Cardiac Enzymes No results for input(s): CKMB, TROPONINI, MYOGLOBIN in the last 168 hours.  Invalid input(s): CK  ------------------------------------------------------------------------------------------------------------------    Component Value Date/Time   BNP 165.4 (H) 06/29/2020 1348    CBG: Recent Labs  Lab 09/20/24 2313 09/22/24 2045  GLUCAP 155* 151*    Recent Results (from the past 240 hours)  Gram stain     Status: None   Collection Time: 09/24/24  3:27 PM   Specimen: Lung, Right; Pleural Fluid  Result Value Ref Range Status   Specimen Description PLEURAL  Final   Special Requests LUNG RIGHT  Final   Gram Stain   Final    RARE WBC PRESENT, PREDOMINANTLY MONONUCLEAR NO ORGANISMS SEEN Performed at [facility], [address] [place], [place] [address]    Report Status 09/24/2024 FINAL  Final  Culture, body fluid w Gram Stain-bottle     Status: None (Preliminary result)   Collection  Time: 09/24/24  3:27 PM   Specimen: Pleura  Result Value Ref Range Status   Specimen Description PLEURAL  Final   Special Requests LUNG RIGHT  Final   Culture   Final    NO GROWTH 3 DAYS Performed at [facility], [address], [place], [place] [address]    Report Status PENDING  Incomplete     Radiology Studies: DG Chest 2 View Result Date: 09/27/2024 CLINICAL DATA:  Congestive heart failure. EXAM: CHEST - 2 VIEW COMPARISON:  09/25/2024 FINDINGS: Patient is rotated. Cardiomegaly persists. Bilateral pleural effusions with improvement from prior exam. Improvement in pulmonary edema. No pneumothorax. IMPRESSION: Improving CHF.  Improving pulmonary edema with decreasing pleural effusions. Electronically Signed   By: Andrea Gasman M.D.   On: 09/27/2024 13:50      Nilda Fendt, MD, PhD [facility]  Between 7 am - 7 pm I am available, please contact me via Amion (for emergencies) or Securechat (non urgent messages)  Between 7 pm - 7 am I am not available, please contact night coverage MD/APP via Amion  "

## 2024-09-27 NOTE — Plan of Care (Signed)
  Problem: Education: Goal: Knowledge of General Education information will improve Description: Including pain rating scale, medication(s)/side effects and non-pharmacologic comfort measures Outcome: Progressing   Problem: Health Behavior/Discharge Planning: Goal: Ability to manage health-related needs will improve Outcome: Progressing   Problem: Clinical Measurements: Goal: Ability to maintain clinical measurements within normal limits will improve Outcome: Progressing Goal: Will remain free from infection Outcome: Progressing Goal: Respiratory complications will improve Outcome: Progressing Goal: Cardiovascular complication will be avoided Outcome: Progressing   Problem: Elimination: Goal: Will not experience complications related to bowel motility Outcome: Progressing Goal: Will not experience complications related to urinary retention Outcome: Progressing

## 2024-09-27 NOTE — TOC Progression Note (Addendum)
 Transition of Care [facility]) - Progression Note    Patient Details  Name: Alyssa Hudson MRN: [id number] Date of Birth: 05-Oct-1940  Transition of [facility]) CM/SW Contact  Lauraine FORBES Saa, LCSWA Phone Number: 09/27/2024, 2:15 PM  Clinical Narrative:     2:15 PM CSW to submit additional clinical requested from Hospitalist and Cardiology progress notes once inputted. CSW will continue to follow.  3:15 PM CSW submitted additional clinicals requested for SNF insurance authorization. [facility] stated they could admit patient over the weekend (bed 707 report [phone] ext [phone]). CSW made medical team aware. MD stated patient will likely discharge Monday. CSW made SNF aware. CSW will continue to follow.  4:01 PM CSW spoke with [facility] representative who stated that based on current clinicals patient was not medically stable for discharge. CSW requested SNF insurance authorization to be cancelled. CSW made medical team aware. CSW will continue to follow.  Expected Discharge Plan: Skilled Nursing Facility Barriers to Discharge: English As A Second Language Teacher, Continued Medical Work up               Expected Discharge Plan and Services In-house Referral: Clinical Social Work Discharge Planning Services: [employer] Consult Post Acute Care Choice: Skilled Nursing Facility Living arrangements for the past 2 months: Single Family Home                                       Social Drivers of Health (SDOH) Interventions SDOH Screenings   Food Insecurity: No Food Insecurity (09/19/2024)  Housing: Low Risk (09/19/2024)  Transportation Needs: No Transportation Needs (09/19/2024)  Utilities: Not At Risk (09/19/2024)  Social Connections: Socially Integrated (09/19/2024)  Tobacco Use: Low Risk (09/20/2024)    Readmission Risk Interventions     No data to display

## 2024-09-27 NOTE — Progress Notes (Signed)
 Physical Therapy Treatment Patient Details Name: Alyssa Hudson MRN: [id number] DOB: [birth year] Today's Date: 09/27/2024   History of Present Illness Pt is an 84 y/o F presenting to ED on 12/20 with SOB, fall with LLE hematoma. CXR with bil PNA. Pt with RR called 12/21 for tachypnea on bipap, intubated 12/21 and transferred to ICU. Admitted for acute hypoxemic respiratory failure 2/2 PNA. Extubated 12/23. PMH includes HTN, A fib, cervical radiculopathy.    PT Comments  Pt received in supine, pleasantly agreeable to therapy session and with good participation and slightly improved tolerance for activity this session compared with previous PT session. Pt needing up to minA and mod safety/sequencing cues for transfers and step pivot to/from [facility]. Pt very fatigued after pre-gait tasks and successful bowel movement, with 3/4 DOE but SpO2 92-97% on RA after pt weaned to RA from 2L, RN aware. Patient will benefit from continued inpatient follow up therapy, <3 hours/day, she is making good progress toward goals and very motivated but remains deconditioned and below prior level of function.    If plan is discharge home, recommend the following: A little help with bathing/dressing/bathroom;Assistance with cooking/housework;Help with stairs or ramp for entrance;A lot of help with walking and/or transfers   Can travel by private vehicle     No  Equipment Recommendations  None recommended by PT    Recommendations for Other Services       Precautions / Restrictions Precautions Precautions: Fall Recall of Precautions/Restrictions: Intact Precaution/Restrictions Comments: watch O2/RR; not on O2 baseline Restrictions Weight Bearing Restrictions Per Provider Order: No     Mobility  Bed Mobility Overal bed mobility: Needs Assistance Bed Mobility: Supine to Sit, Sit to Supine     Supine to sit: Min assist, HOB elevated, Used rails Sit to supine: Used rails, Contact guard assist   General bed  mobility comments: minA to elevate trunk and maintain sitting balance initially, pt leaning to R when fatigued. CGA to LE to return to bed but pt not needing assist to lift legs. L/R rolling wtih supervision and pt able to perform a bridge in supine to adjust covers, but only briefly.    Transfers Overall transfer level: Needs assistance Equipment used: Rolling walker (2 wheels) Transfers: Sit to/from Stand, Bed to chair/wheelchair/BSC Sit to Stand: Min assist   Step pivot transfers: Min assist       General transfer comment: posterior instability upon standing and pt unable to fully rise without minA lift assist each attempt. STS x2 from EOB and to/from [facility] due to need for urination/BM. Pt tolerated sitting on BSC ~10 mins prior to return to EOB. Too fatigued after toileting and pre-gait standing exercises to perform ambulation in room/    Ambulation/Gait Ambulation/Gait assistance: Min assist   Assistive device: Rolling walker (2 wheels)       Pre-gait activities: standing hip flexion x10 reps ea General Gait Details: steps to/from [facility] but pt not feeling up to further ambulation and fatigued from only pregait work. DOE 3/4 after each transfer   Stairs             Wheelchair Mobility     Tilt Bed    Modified Rankin (Stroke Patients Only)       Balance Overall balance assessment: Needs assistance Sitting-balance support: Feet supported, Bilateral upper extremity supported Sitting balance-Leahy Scale: Fair Sitting balance - Comments: up to minA to steady Postural control: Right lateral lean, Posterior lean Standing balance support: During functional activity, Reliant on assistive device for  balance, Bilateral upper extremity supported Standing balance-Leahy Scale: Poor Standing balance comment: unable to assist with posterior peri-care or pulling up briefs standing at RW, needing BUE support for static and dynamic standing tasks                             Communication Communication Communication: Impaired Factors Affecting Communication: Hearing impaired  Cognition Arousal: Alert Behavior During Therapy: WFL for tasks assessed/performed   PT - Cognitive impairments: No apparent impairments                       PT - Cognition Comments: pt fatigued and largely maintaining eyes closed, but can open and answer questions/follow commands appropriately. needing increased time to complete all movements Following commands: Intact      Cueing Cueing Techniques: Verbal cues, Gestural cues, Tactile cues  Exercises General Exercises - Lower Extremity Hip Flexion/Marching: AROM, Both, 10 reps, Standing (at RW) Other Exercises Other Exercises: PTA reviewed IS and encouraged hourly use x10 reps    General Comments General comments (skin integrity, edema, etc.): Pt received on 2L O2 [place] with SpO2 99% at rest, weaned on RA and SpO2 92-97% on RA with rest and exertional tasks; HR variable (Afib per monitor) reading 40's-80's bpm with activity/rest; BP seated EOB  after step pivot x2 149/48 (78) similar to prior readings      Pertinent Vitals/Pain Pain Assessment Pain Assessment: Faces Faces Pain Scale: Hurts a little bit Pain Location: L>R knee Pain Descriptors / Indicators: Discomfort (L knee stiff)    Home Living                          Prior Function            PT Goals (current goals can now be found in the care plan section) Acute Rehab PT Goals Patient Stated Goal: to return home but agreeable to ST rehab if needed Progress towards PT goals: Progressing toward goals    Frequency    Min 2X/week      PT Plan      Co-evaluation              AM-PAC PT 6 Clicks Mobility   Outcome Measure  Help needed turning from your back to your side while in a flat bed without using bedrails?: A Little Help needed moving from lying on your back to sitting on the side of a flat bed without using  bedrails?: A Little Help needed moving to and from a bed to a chair (including a wheelchair)?: A Lot (heavy minA, mod safety cues) Help needed standing up from a chair using your arms (e.g., wheelchair or bedside chair)?: A Little (with fatigue, closer to [facility]) Help needed to walk in hospital room?: Total Help needed climbing 3-5 steps with a railing? : Total 6 Click Score: 13    End of Session Equipment Utilized During Treatment: Oxygen;Gait belt (weaned to RA from 2L during session prior to OOB) Activity Tolerance: Patient limited by fatigue Patient left: with call bell/phone within reach;in bed;with bed alarm set;with family/visitor present (nephew present) Nurse Communication: Mobility status;Other (comment) (+ BM; weaned to RA) PT Visit Diagnosis: Unsteadiness on feet (R26.81);Muscle weakness (generalized) (M62.81)     Time: 8955-8887 PT Time Calculation (min) (ACUTE ONLY): 28 min  Charges:    $Therapeutic Activity: 23-37 mins PT General Charges $$ ACUTE PT VISIT:  1 Visit                     Connell SQUIBB., PTA Acute Rehabilitation Services Secure Chat Preferred 9a-5:30pm Office: [phone]    Connell HERO [facility] 09/27/2024, 11:25 AM

## 2024-09-27 NOTE — Progress Notes (Signed)
 "  Rounding Note   Patient Name: Alyssa Hudson Date of Encounter: 09/27/2024  [facility] Cardiologist: Jennifer JONELLE Crape, MD   Subjective Pt breathing better   Slept better last 2 nights per husband's report   Scheduled Meds:  amiodarone   200 mg Oral BID   apixaban   2.5 mg Oral BID   aspirin  EC  81 mg Oral Daily   atorvastatin   40 mg Oral Daily   benzonatate   100 mg Oral TID   clopidogrel   75 mg Oral Q breakfast   feeding supplement  237 mL Oral BID BM   guaiFENesin   600 mg Oral BID   isosorbide  mononitrate  30 mg Oral Daily   pantoprazole   40 mg Oral QHS   sodium chloride  flush  3 mL Intravenous Q12H   Continuous Infusions:  PRN Meds: acetaminophen , ALPRAZolam , hydrALAZINE , ipratropium-albuterol , labetalol , ondansetron  (ZOFRAN ) IV, mouth rinse, sodium chloride  flush   Vital Signs  Vitals:   09/27/24 0500 09/27/24 0729 09/27/24 0900 09/27/24 1146  BP:  137/76  (!) 125/48  Pulse: (!) 47 60  65  Resp: 20 20  20   Temp:  98.4 F (36.9 C)  98 F (36.7 C)  TempSrc:  Oral  Oral  SpO2: 98% 97% 92% 97%  Weight:      Height:        Intake/Output Summary (Last 24 hours) at 09/27/2024 1438 Last data filed at 09/27/2024 0455 Gross per 24 hour  Intake 240 ml  Output 850 ml  Net -610 ml      09/27/2024    4:00 AM 09/26/2024    6:00 AM 09/25/2024    6:55 AM  Last 3 Weights  Weight (lbs) 154 lb 8.7 oz 156 lb 1.4 oz 154 lb 12.2 oz  Weight (kg) 70.1 kg 70.8 kg 70.2 kg      Telemetry SR 50s to 80s  - Personally Reviewed  ECG  No new  - Personally Reviewed  Physical Exam  GEN: No acute distress.   Neck: No JVD Cardiac: RRR, no murmur Respiratory: Clear to auscultation GI: Soft, nontender, non-distended  MS: No edema;  Labs High Sensitivity Troponin:  No results for input(s): TROPONINIHS in the last 720 hours.  Recent Labs  Lab 09/14/24 1141 09/14/24 1341 09/14/24 1450 09/15/24 1218 09/15/24 1555  TRNPT 198* 254* 235* 331* 742*        Chemistry Recent Labs  Lab 09/24/24 1059 09/25/24 0253 09/26/24 0235 09/27/24 0214  NA 134* 133* 132* 136  K 4.4 4.6 3.9 3.0*  CL 95* 95* 93* 95*  CO2 26 26 28 29   GLUCOSE 139* 131* 120* 116*  BUN 54* 58* 71* 68*  CREATININE 1.87* 1.88* 1.90* 1.88*  CALCIUM  8.9 8.8* 8.7* 8.6*  MG  --  2.1 2.1 1.9  PROT 6.5  --  6.3*  --   ALBUMIN 3.2*  --  3.1*  --   AST <10*  --  14*  --   ALT 7  --  7  --   ALKPHOS 67  --  66  --   BILITOT 0.8  --  0.9  --   GFRNONAA 26* 26* 25* 26*  ANIONGAP 13 13 12 12     Lipids No results for input(s): CHOL, TRIG, HDL, LABVLDL, LDLCALC, CHOLHDL in the last 168 hours.  Hematology Recent Labs  Lab 09/25/24 0253 09/26/24 0235 09/27/24 0214  WBC 19.1* 15.8* 15.4*  RBC 3.23* 2.96* 2.94*  HGB 9.2* 8.3* 8.2*  HCT 28.4*  25.4* 25.8*  MCV 87.9 85.8 87.8  MCH 28.5 28.0 27.9  MCHC 32.4 32.7 31.8  RDW 14.7 14.9 14.9  PLT 184 190 157   Thyroid  No results for input(s): TSH, FREET4 in the last 168 hours.  BNP Recent Labs  Lab 09/23/24 1815  PROBNP >35,000.0*    DDimer No results for input(s): DDIMER in the last 168 hours.   Radiology  DG Chest 2 View Result Date: 09/27/2024 CLINICAL DATA:  Congestive heart failure. EXAM: CHEST - 2 VIEW COMPARISON:  09/25/2024 FINDINGS: Patient is rotated. Cardiomegaly persists. Bilateral pleural effusions with improvement from prior exam. Improvement in pulmonary edema. No pneumothorax. IMPRESSION: Improving CHF. Improving pulmonary edema with decreasing pleural effusions. Electronically Signed   By: Andrea Gasman M.D.   On: 09/27/2024 13:50    Cardiac Studies  LHC 09/15/24    Mid LM to Dist LM lesion is 30% stenosed.   Prox LAD to Mid LAD lesion is 90% stenosed.   Prox Cx to Mid Cx lesion is 95% stenosed.   Dist Cx lesion is 80% stenosed.   1st Diag-2 lesion is 90% stenosed.   1st Diag-1 lesion is 80% stenosed.   Prox RCA lesion is 50% stenosed.   Mid RCA lesion is 85% stenosed.   1st  Mrg lesion is 50% stenosed.   A drug-eluting stent was successfully placed using a STENT SYNERGY XD [id number].   A drug-eluting stent was successfully placed using a STENT SYNERGY XD [id number].   Post intervention, there is a 0% residual stenosis.   Post intervention, there is a 0% residual stenosis.   LV end diastolic pressure is moderately elevated.   Recommend uninterrupted dual antiplatelet therapy with Aspirin  81mg  daily and Clopidogrel  75mg  daily for a minimum of 12 months (ACS-Class I recommendation).   1.  Patent left main with mild nonobstructive distal vessel stenosis 2.  Severe proximal LAD stenosis, treated successfully with a 2.5 x 20 mm Synergy DES 3.  Severe proximal circumflex stenosis treated successfully with overlapping 2.75 x 24 mm and 3.0 x 16 mm Synergy DES 4.  Severe mid to distal RCA stenosis and moderate diffuse proximal to mid RCA stenosis 5.  Severe residual stenosis in a large diagonal/intermediate branch and distal circumflex 6.  Moderately elevated LVEDP of 21 mmHg    Echo  09/14/24   1. Left ventricular ejection fraction, by estimation, is 55 to 60%. Left  ventricular ejection fraction by 2D MOD biplane is 57.2 %. The left  ventricle has normal function. The left ventricle has no regional wall  motion abnormalities. Left ventricular  diastolic parameters are consistent with Grade III diastolic dysfunction  (restrictive). Elevated left atrial pressure.   2. Right ventricular systolic function is normal. The right ventricular  size is mildly enlarged. There is moderately elevated pulmonary artery  systolic pressure. The estimated right ventricular systolic pressure is  50.0 mmHg.   3. Left atrial size was moderately dilated.   4. The mitral valve is degenerative. Mild to moderate mitral valve  regurgitation.   5. Tricuspid valve regurgitation is moderate.   6. The aortic valve is calcified. Aortic valve regurgitation is not  visualized. Aortic valve  sclerosis/calcification is present, without any  evidence of aortic stenosis.   7. The inferior vena cava is dilated in size with <50% respiratory  variability, suggesting right atrial pressure of 15 mmHg.   Comparison(s): Changes from prior study are noted. EF is similar and TR is  now moderate.   Patient  Profile   84 y.o. female   Assessment & Plan   1  Acute on chronic HFpEF    Pt presented initially to hospital with cardiogenic/septc shock  Underwent intervention to LAD and LCx   Treated medically (AHF service)    Cardiology signed off on 09/22/24    On 09/24/24 pt underwent R thoracentesis (850 cc amber fluid) 09/25/24   Cardiology called back for recomm given continued SOB   Lasix  was increased to 80 mg IV bid  Pt has diuresesed a couple more L of fluid though wt is relatively unchanged    Clincally pt is improved, breathing easier   REcomm It will be challenging to find correct dose of diuretic I would recomm torsemide 40 mg daily    Would probably  2.5 metolazone 2x per week    FOllow weights, renal function and symptoms  Will get CXR   (PA and Lat)  2  Hx PAF   Pt back in SR   On po amiodarone     200 bid   I would continue this for a couple weeks after d/c then 200 daily    Keep on Eliquis  2.5 bid  3  Hx CAD  Pt s/p PTCA/DES to LAD and LCx earlier in Dec    Keep on ASA/Plavix  and ELiquis  for 1 month then Plavix  and Eliquis     Follow Hgb closely   4  HTN     BP 120s to 140s   5 HL  Pt on lipitor 40 mg     6  Renal   BUN/Cr 68/1.88   Will need to be followedd K 3.0 today   Replet  7   Heme  H/H 8.2 and 25.8   (was 9.2 and 29.3 on 09/14/24)  Will need to be followed closely    WBC is 15.4   Pt afebrile   Getting CXR    Signed, Vina Gull, MD  09/27/2024, 2:38 PM    "

## 2024-09-28 DIAGNOSIS — I480 Paroxysmal atrial fibrillation: Secondary | ICD-10-CM | POA: Diagnosis not present

## 2024-09-28 DIAGNOSIS — I214 Non-ST elevation (NSTEMI) myocardial infarction: Secondary | ICD-10-CM | POA: Diagnosis not present

## 2024-09-28 DIAGNOSIS — I441 Atrioventricular block, second degree: Secondary | ICD-10-CM | POA: Diagnosis not present

## 2024-09-28 DIAGNOSIS — I5033 Acute on chronic diastolic (congestive) heart failure: Secondary | ICD-10-CM | POA: Diagnosis not present

## 2024-09-28 DIAGNOSIS — R0603 Acute respiratory distress: Secondary | ICD-10-CM | POA: Diagnosis not present

## 2024-09-28 DIAGNOSIS — I10 Essential (primary) hypertension: Secondary | ICD-10-CM | POA: Diagnosis not present

## 2024-09-28 DIAGNOSIS — R579 Shock, unspecified: Secondary | ICD-10-CM | POA: Diagnosis not present

## 2024-09-28 LAB — CBC
HCT: 25.8 % — ABNORMAL LOW (ref 36.0–46.0)
Hemoglobin: 8.2 g/dL — ABNORMAL LOW (ref 12.0–15.0)
MCH: 27.7 pg (ref 26.0–34.0)
MCHC: 31.8 g/dL (ref 30.0–36.0)
MCV: 87.2 fL (ref 80.0–100.0)
Platelets: 157 K/uL (ref 150–400)
RBC: 2.96 MIL/uL — ABNORMAL LOW (ref 3.87–5.11)
RDW: 15.1 % (ref 11.5–15.5)
WBC: 11.2 K/uL — ABNORMAL HIGH (ref 4.0–10.5)
nRBC: 0 % (ref 0.0–0.2)

## 2024-09-28 LAB — COMPREHENSIVE METABOLIC PANEL WITH GFR
ALT: 11 U/L (ref 0–44)
AST: 15 U/L (ref 15–41)
Albumin: 2.9 g/dL — ABNORMAL LOW (ref 3.5–5.0)
Alkaline Phosphatase: 72 U/L (ref 38–126)
Anion gap: 11 (ref 5–15)
BUN: 61 mg/dL — ABNORMAL HIGH (ref 8–23)
CO2: 28 mmol/L (ref 22–32)
Calcium: 8.9 mg/dL (ref 8.9–10.3)
Chloride: 98 mmol/L (ref 98–111)
Creatinine, Ser: 1.59 mg/dL — ABNORMAL HIGH (ref 0.44–1.00)
GFR, Estimated: 31 mL/min — ABNORMAL LOW
Glucose, Bld: 114 mg/dL — ABNORMAL HIGH (ref 70–99)
Potassium: 3.5 mmol/L (ref 3.5–5.1)
Sodium: 137 mmol/L (ref 135–145)
Total Bilirubin: 0.8 mg/dL (ref 0.0–1.2)
Total Protein: 6.1 g/dL — ABNORMAL LOW (ref 6.5–8.1)

## 2024-09-28 LAB — MAGNESIUM: Magnesium: 1.9 mg/dL (ref 1.7–2.4)

## 2024-09-28 MED ORDER — MELATONIN 5 MG PO TABS
5.0000 mg | ORAL_TABLET | Freq: Every day | ORAL | Status: DC
  Administered 2024-09-28 – 2024-09-29 (×2): 5 mg via ORAL
  Filled 2024-09-28 (×3): qty 1

## 2024-09-28 MED ORDER — LOSARTAN POTASSIUM 25 MG PO TABS
25.0000 mg | ORAL_TABLET | Freq: Every day | ORAL | Status: DC
  Administered 2024-09-28 – 2024-09-30 (×3): 25 mg via ORAL
  Filled 2024-09-28 (×3): qty 1

## 2024-09-28 NOTE — Plan of Care (Signed)
   Problem: Education: Goal: Knowledge of General Education information will improve Description Including pain rating scale, medication(s)/side effects and non-pharmacologic comfort measures Outcome: Progressing

## 2024-09-28 NOTE — Progress Notes (Signed)
 "  Progress Note  Patient Name: Alyssa Hudson Date of Encounter: 09/28/2024  Primary Cardiologist: Jennifer JONELLE Crape, MD   Subjective   Doing well with no complaints.  Denies chest pain or shortness of breath.  Inpatient Medications    Scheduled Meds:  amiodarone   200 mg Oral BID   apixaban   2.5 mg Oral BID   aspirin  EC  81 mg Oral Daily   atorvastatin   40 mg Oral Daily   benzonatate   100 mg Oral TID   clopidogrel   75 mg Oral Q breakfast   feeding supplement  237 mL Oral BID BM   guaiFENesin   600 mg Oral BID   isosorbide  mononitrate  30 mg Oral Daily   melatonin  5 mg Oral QHS   pantoprazole   40 mg Oral QHS   sodium chloride  flush  3 mL Intravenous Q12H   torsemide   40 mg Oral Daily   Continuous Infusions:  PRN Meds: acetaminophen , ALPRAZolam , hydrALAZINE , ipratropium-albuterol , labetalol , ondansetron  (ZOFRAN ) IV, mouth rinse, sodium chloride  flush   Vital Signs    Vitals:   09/27/24 2000 09/27/24 2239 09/28/24 0400 09/28/24 0721  BP: (!) 145/49 (!) 141/59 (!) 150/54 (!) 157/50  Pulse: 76 78 76   Resp: 20 20 20    Temp: 98.2 F (36.8 C) 98.7 F (37.1 C) 98.7 F (37.1 C) 98.1 F (36.7 C)  TempSrc: Oral Oral Oral Oral  SpO2: 99% 100% 95%   Weight:   69.7 kg   Height:        Intake/Output Summary (Last 24 hours) at 09/28/2024 9178 Last data filed at 09/28/2024 0542 Gross per 24 hour  Intake 120 ml  Output 1000 ml  Net -880 ml   Filed Weights   09/26/24 0600 09/27/24 0400 09/28/24 0400  Weight: 70.8 kg 70.1 kg 69.7 kg    Telemetry    Normal sinus rhythm with blocked PACs- Personally Reviewed  ECG    Normal sinus rhythm with Mobitz type II- Personally Reviewed  Physical Exam   Physical Exam Vitals and nursing note reviewed.  Constitutional:      Appearance: Normal appearance.  Eyes:     Conjunctiva/sclera: Conjunctivae normal.  Cardiovascular:     Rate and Rhythm: Normal rate and regular rhythm.  Pulmonary:     Effort: Pulmonary effort is  normal.     Breath sounds: Rales present.  Musculoskeletal:        General: No swelling or tenderness.  Skin:    Coloration: Skin is not jaundiced or pale.  Neurological:     Mental Status: She is alert.     Comments: Left-sided facial droop consistent with patient's baseline      Labs    Chemistry Recent Labs  Lab 09/24/24 1059 09/25/24 0253 09/26/24 0235 09/27/24 0214 09/28/24 0210  NA 134*   < > 132* 136 137  K 4.4   < > 3.9 3.0* 3.5  CL 95*   < > 93* 95* 98  CO2 26   < > 28 29 28   GLUCOSE 139*   < > 120* 116* 114*  BUN 54*   < > 71* 68* 61*  CREATININE 1.87*   < > 1.90* 1.88* 1.59*  CALCIUM  8.9   < > 8.7* 8.6* 8.9  PROT 6.5  --  6.3*  --  6.1*  ALBUMIN  3.2*  --  3.1*  --  2.9*  AST <10*  --  14*  --  15  ALT 7  --  7  --  11  ALKPHOS 67  --  66  --  72  BILITOT 0.8  --  0.9  --  0.8  GFRNONAA 26*   < > 25* 26* 31*  ANIONGAP 13   < > 12 12 11    < > = values in this interval not displayed.     Hematology Recent Labs  Lab 09/26/24 0235 09/27/24 0214 09/28/24 0210  WBC 15.8* 15.4* 11.2*  RBC 2.96* 2.94* 2.96*  HGB 8.3* 8.2* 8.2*  HCT 25.4* 25.8* 25.8*  MCV 85.8 87.8 87.2  MCH 28.0 27.9 27.7  MCHC 32.7 31.8 31.8  RDW 14.9 14.9 15.1  PLT 190 157 157    Cardiac EnzymesNo results for input(s): TROPONINI in the last 168 hours. No results for input(s): TROPIPOC in the last 168 hours.   BNP Recent Labs  Lab 09/23/24 1815  PROBNP >35,000.0*     DDimer No results for input(s): DDIMER in the last 168 hours.   Radiology    DG Chest 2 View Result Date: 09/27/2024 CLINICAL DATA:  Congestive heart failure. EXAM: CHEST - 2 VIEW COMPARISON:  09/25/2024 FINDINGS: Patient is rotated. Cardiomegaly persists. Bilateral pleural effusions with improvement from prior exam. Improvement in pulmonary edema. No pneumothorax. IMPRESSION: Improving CHF. Improving pulmonary edema with decreasing pleural effusions. Electronically Signed   By: Andrea Gasman M.D.   On:  09/27/2024 13:50    Cardiac Studies   LHC 09/15/2024:   Mid LM to Dist LM lesion is 30% stenosed.   Prox LAD to Mid LAD lesion is 90% stenosed.   Prox Cx to Mid Cx lesion is 95% stenosed.   Dist Cx lesion is 80% stenosed.   1st Diag-2 lesion is 90% stenosed.   1st Diag-1 lesion is 80% stenosed.   Prox RCA lesion is 50% stenosed.   Mid RCA lesion is 85% stenosed.   1st Mrg lesion is 50% stenosed.   A drug-eluting stent was successfully placed using a STENT SYNERGY XD [id number].   A drug-eluting stent was successfully placed using a STENT SYNERGY XD [id number].   Post intervention, there is a 0% residual stenosis.   Post intervention, there is a 0% residual stenosis.   LV end diastolic pressure is moderately elevated.   Recommend uninterrupted dual antiplatelet therapy with Aspirin  81mg  daily and Clopidogrel  75mg  daily for a minimum of 12 months (ACS-Class I recommendation).   1.  Patent left main with mild nonobstructive distal vessel stenosis 2.  Severe proximal LAD stenosis, treated successfully with a 2.5 x 20 mm Synergy DES 3.  Severe proximal circumflex stenosis treated successfully with overlapping 2.75 x 24 mm and 3.0 x 16 mm Synergy DES 4.  Severe mid to distal RCA stenosis and moderate diffuse proximal to mid RCA stenosis 5.  Severe residual stenosis in a large diagonal/intermediate branch and distal circumflex 6.  Moderately elevated LVEDP of 21 mmHg  Echo 09/14/24:   1. Left ventricular ejection fraction, by estimation, is 55 to 60%. Left  ventricular ejection fraction by 2D MOD biplane is 57.2 %. The left  ventricle has normal function. The left ventricle has no regional wall  motion abnormalities. Left ventricular  diastolic parameters are consistent with Grade III diastolic dysfunction  (restrictive). Elevated left atrial pressure.   2. Right ventricular systolic function is normal. The right ventricular  size is mildly enlarged. There is moderately elevated pulmonary  artery  systolic pressure. The estimated right ventricular systolic pressure is  50.0 mmHg.   3. Left atrial  size was moderately dilated.   4. The mitral valve is degenerative. Mild to moderate mitral valve  regurgitation.   5. Tricuspid valve regurgitation is moderate.   6. The aortic valve is calcified. Aortic valve regurgitation is not  visualized. Aortic valve sclerosis/calcification is present, without any  evidence of aortic stenosis.   7. The inferior vena cava is dilated in size with <50% respiratory  variability, suggesting right atrial pressure of 15 mmHg.   Comparison(s): Changes from prior study are noted. EF is similar and TR is  now moderate.    Patient Profile     84 y.o. female with a past medical history of chronic diastolic heart failure, paroxysmal atrial fibrillation, viral pericarditis with pericardial effusion in 2021, hypertension, obesity who initially presented to the ED on 12/20 with shortness of breath x 2 months and acutely worsening over the week prior to admission with decreased appetite.  She was hypertensive and tachypneic in the ED requiring nonrebreather mask, WBC 24 and troponin 195-198-> 254.  proBNP 18,000 and CXR with bilateral pneumonia.  She began on diuresis for diastolic heart failure exacerbation.  A rapid response was called on 12/21 due to respiratory distress and chest pain and placed on BiPAP.  Found to have diffuse ST depressions in inferolateral leads and due to concern for evolving ACS patient underwent urgent left heart catheterization with Dr. Wonda on 09/15/2024 s/p DES to proximal LAD and 2 overlapping stents to proximal circumflex with residual disease in RCA and diagonal, LVEDP 21.  She was intubated post cath and extubated 12/23.  Assessment & Plan   Acute on chronic diastolic heart failure-mildly volume overloaded today but approaching baseline.  Hemodynamically stable and on room air.  Currently net negative fluid status since  admission and weight down 5 pounds since admit.  To start on torsemide  40 mg daily today.  Not a candidate for SGLT2 inhibitors due to UTI.  Continue to monitor daily weight and intake/output NSTEMI on 09/15/2024 s/p DES to proximal LAD and 2 overlapping stents to proximal circumflex with severe multivessel disease-stable and chest free.  Currently on triple therapy (DAPT and DOAC), Imdur  30 mg, atorvastatin  40 mg Multifactorial shock: Cardiogenic and septic (pneumonia and UTI)-Off pressors and hemodynamically stable. Paroxysmal atrial fibrillation with history of paroxysmal A-fib-currently normal sinus rhythm on amiodarone  200 mg twice daily, Eliquis  2.5 mg twice daily, aspirin  and clopidogrel  post cath.  Previously off anticoagulation due to patient preference Hypertension-start losartan  25 mg (on 100 mg at home) Sinus rhythm with blocked PACs S/p right thoracentesis on 09/24/2024 AKI on CKD-improving Mobitz type II on ECG with history of Mobitz 1 and 2-currently normal sinus rhythm with blocked PACs.  Continue to hold beta-blocker  Time spent coordinating care: 55 minutes     For questions or updates, please contact [facility] Please consult [email or web address] for contact info under        Signed, Emeline Calender, DO 09/28/2024, 8:21 AM    "

## 2024-09-28 NOTE — Progress Notes (Signed)
 Mobility Specialist Progress Note;   09/28/24 0901  Mobility  Activity Pivoted/transferred from bed to chair  Level of Assistance Contact guard assist, steadying assist  Assistive Device Front wheel walker  Distance Ambulated (ft) 7 ft  Activity Response Tolerated well  Mobility Referral Yes  Mobility visit 1 Mobility  Mobility Specialist Start Time (ACUTE ONLY) 0901  Mobility Specialist Stop Time (ACUTE ONLY) [id number]  Mobility Specialist Time Calculation (min) (ACUTE ONLY) 22 min   Pt eager for mobility. Required MinG for bed mobility and to safely ambulate to chair. VSS on RA throughout. Pt deferred ambulation at this time d/t fatigue, however comfortable in chair. Pt left in chair with all needs met. Nephew present. RN notified.   Lauraine Erm Mobility Specialist Please contact via SecureChat or [employer] [phone]

## 2024-09-28 NOTE — Progress Notes (Signed)
" °   09/28/24 2200  BiPAP/CPAP/SIPAP  Reason BIPAP/CPAP not in use Non-compliant (Pt. refused.)    "

## 2024-09-28 NOTE — Progress Notes (Signed)
 " PROGRESS NOTE  Alyssa Hudson FMW:[id number] DOB: [birth year] DOA: 09/14/2024 PCP: [name], MD   LOS: 14 days   Brief Narrative / Interim history: 84 year old female with history of aortic stenosis, chronic diastolic CHF, chronic A-fib, CAD, HTN comes into the hospital with cough, shortness of breath.  She was found to have pneumonia, but also had significant troponin elevation and cardiology was consulted.  She underwent a cardiac cath with stent placement to LAD.  Hospital course complicated by flash pulmonary edema having to be intubated, eventually was transferred out of the ICU on 12/25.  She has recurrent flash pulmonary edema on 12/26, and also right-sided large pleural effusion s/p thoracentesis 12/30.  She had recurrent pulmonary edema and shortness of breath overnight 12/30-12/31  Subjective / 24h Interval events: She continues to do well.  Was able to be weaned off of oxygen yesterday afternoon and currently is on room air.  Has slept well.  Denies any chest pain  Assesement and Plan: Principal problem CAD, Non-STEMI -cardiology consulted and followed patient while hospitalized.  Underwent a cardiac catheterization and is now status post PCI to DES to LAD.  She also has severe triple-vessel disease.  Currently she is on Eliquis , aspirin  and Plavix , she will need to come off aspirin  on 10/16/2024 at 30 days after her cath - Continue statin  Active problems Acute on chronic diastolic CHF -patient initially diuresed with IV and transition to p.o. Lasix , however had recurrent pulmonary edema and respiratory distress.  She was placed back on IV furosemide  again with improvement in her respiratory status.  - Given failure to p.o. furosemide , discussed with cardiology yesterday and will switch to torsemide  with intermittent metolazone .  Chest x-ray done yesterday shows significant improvement in her aeration and this is correlated clinically with her being on room air - Closely  watch on oral diuretics over the next day, in and outs, weights  New onset A-fib -cardiology following, currently on oral amiodarone , Eliquis .  Continue  Cardiogenic shock -was in the ICU briefly on pressors.  Now weaned off and stable on the floor.  Acute hypoxic respiratory failure -multifactorial in the setting of pneumonia, non-STEMI, flash pulmonary edema.  On room air this morning  Possible pneumonia -patient completed a course of antibiotics with IV Zosyn .  She did have a rise in her white count on 12/31, improved today.  She is afebrile.  Hold further antibiotics, leukocytosis improving  AKI on CKD 3B-creatinine appears to be 1.4 at baseline.  Creatinine peaked at 2.5, likely due to shock.  Creatinine now stable around 1.8-1.9 with ongoing diuresis, but overall stable and without large fluctuations.  Down to 1.6 today  Acute blood loss anemia -after an injury and a fall, has significant left leg hematoma.  She is also iron  deficient status post IV iron .  She is status post a unit of packed red blood cells on 12/22  Scheduled Meds:  amiodarone   200 mg Oral BID   apixaban   2.5 mg Oral BID   aspirin  EC  81 mg Oral Daily   atorvastatin   40 mg Oral Daily   benzonatate   100 mg Oral TID   clopidogrel   75 mg Oral Q breakfast   feeding supplement  237 mL Oral BID BM   guaiFENesin   600 mg Oral BID   isosorbide  mononitrate  30 mg Oral Daily   losartan   25 mg Oral Daily   melatonin  5 mg Oral QHS   pantoprazole   40 mg Oral  QHS   sodium chloride  flush  3 mL Intravenous Q12H   torsemide   40 mg Oral Daily   Continuous Infusions: PRN Meds:.acetaminophen , ALPRAZolam , hydrALAZINE , ipratropium-albuterol , labetalol , ondansetron  (ZOFRAN ) IV, mouth rinse, sodium chloride  flush  Current Outpatient Medications  Medication Instructions   amLODipine  (NORVASC ) 10 mg, Oral, Daily   aspirin  EC 81 mg, Oral, Daily, Swallow whole.   aspirin  EC 325 mg, Oral, Daily PRN   Biotin 5,000 mcg, Oral, Daily    CRANBERRY PO 650 mg, Oral, Daily   furosemide  (LASIX ) 40 mg, Oral, Daily, Not taking on therapy days   hydrochlorothiazide  (HYDRODIURIL ) 25 mg, Oral, Daily, Takes only on therapy days.   losartan  (COZAAR ) 100 mg, Oral, Daily   MAGNESIUM  PO 400 mg, Daily   metoprolol  succinate (TOPROL -XL) 50 mg, Oral, Daily   OVER THE COUNTER MEDICATION 4 capsules, Daily   Probiotic Product (PROBIOTIC PO) 1 tablet, Oral, Daily    Diet Orders (From admission, onward)     Start     Ordered   09/20/24 0908  Diet Heart Room service appropriate? Yes; Fluid consistency: Thin  Diet effective now       Question Answer Comment  Room service appropriate? Yes   Fluid consistency: Thin      09/20/24 0908            DVT prophylaxis: apixaban  (ELIQUIS ) tablet 2.5 mg Start: 09/17/24 1615 apixaban  (ELIQUIS ) tablet 2.5 mg   Lab Results  Component Value Date   PLT 157 09/28/2024      Code Status: Do not attempt resuscitation (DNR) PRE-ARREST INTERVENTIONS DESIRED  Family Communication: Nephew at bedside  Status is: Inpatient Remains inpatient appropriate because: Severity of illness   Level of care: Progressive  Consultants:  Cardiology  Objective: Vitals:   09/27/24 2000 09/27/24 2239 09/28/24 0400 09/28/24 0721  BP: (!) 145/49 (!) 141/59 (!) 150/54 (!) 157/50  Pulse: 76 78 76   Resp: 20 20 20    Temp: 98.2 F (36.8 C) 98.7 F (37.1 C) 98.7 F (37.1 C) 98.1 F (36.7 C)  TempSrc: Oral Oral Oral Oral  SpO2: 99% 100% 95%   Weight:   69.7 kg   Height:        Intake/Output Summary (Last 24 hours) at 09/28/2024 1101 Last data filed at 09/28/2024 0958 Gross per 24 hour  Intake 360 ml  Output 1000 ml  Net -640 ml   Wt Readings from Last 3 Encounters:  09/28/24 69.7 kg  07/31/24 72.9 kg  07/28/23 74.1 kg    Examination:  Constitutional: NAD Eyes: lids and conjunctivae normal, no scleral icterus ENMT: mmm Neck: normal, supple Respiratory: clear to auscultation bilaterally, no  wheezing, no crackles. Normal respiratory effort.  Cardiovascular: Regular rate and rhythm, no murmurs / rubs / gallops. No LE edema. Abdomen: soft, no distention, no tenderness. Bowel sounds positive.   Data Reviewed: I have independently reviewed following labs and imaging studies   CBC Recent Labs  Lab 09/22/24 0146 09/23/24 1815 09/24/24 1059 09/25/24 0253 09/26/24 0235 09/27/24 0214 09/28/24 0210  WBC 13.4* 17.8* 17.1* 19.1* 15.8* 15.4* 11.2*  HGB 8.4* 8.3* 8.6* 9.2* 8.3* 8.2* 8.2*  HCT 26.4* 26.3* 26.5* 28.4* 25.4* 25.8* 25.8*  PLT 168 196 196 184 190 157 157  MCV 88.3 89.2 86.3 87.9 85.8 87.8 87.2  MCH 28.1 28.1 28.0 28.5 28.0 27.9 27.7  MCHC 31.8 31.6 32.5 32.4 32.7 31.8 31.8  RDW 15.0 14.8 14.8 14.7 14.9 14.9 15.1  LYMPHSABS 3.1 2.2  --   --   --   --   --  MONOABS 1.5* 2.8*  --   --   --   --   --   EOSABS 0.0 0.0  --   --   --   --   --   BASOSABS 0.0 0.1  --   --   --   --   --     Recent Labs  Lab 09/22/24 0146 09/23/24 0144 09/23/24 1815 09/24/24 1059 09/25/24 0253 09/25/24 1342 09/26/24 0235 09/27/24 0214 09/28/24 0210  NA 138   < >  --  134* 133*  --  132* 136 137  K 3.6   < >  --  4.4 4.6  --  3.9 3.0* 3.5  CL 99   < >  --  95* 95*  --  93* 95* 98  CO2 27   < >  --  26 26  --  28 29 28   GLUCOSE 137*   < >  --  139* 131*  --  120* 116* 114*  BUN 42*   < >  --  54* 58*  --  71* 68* 61*  CREATININE 1.64*   < >  --  1.87* 1.88*  --  1.90* 1.88* 1.59*  CALCIUM  8.7*   < >  --  8.9 8.8*  --  8.7* 8.6* 8.9  AST  --   --   --  <10*  --   --  14*  --  15  ALT  --   --   --  7  --   --  7  --  11  ALKPHOS  --   --   --  67  --   --  66  --  72  BILITOT  --   --   --  0.8  --   --  0.9  --  0.8  ALBUMIN   --   --   --  3.2*  --   --  3.1*  --  2.9*  MG 1.7  --   --   --  2.1  --  2.1 1.9 1.9  CRP  --   --  6.0*  --   --   --   --   --   --   PROCALCITON  --   --  0.25  --   --   --   --   --   --   LATICACIDVEN  --   --   --   --   --  2.1*  --   --   --     < > = values in this interval not displayed.    ------------------------------------------------------------------------------------------------------------------ No results for input(s): CHOL, HDL, LDLCALC, TRIG, CHOLHDL, LDLDIRECT in the last 72 hours.  Lab Results  Component Value Date   HGBA1C 5.4 11/18/2020   ------------------------------------------------------------------------------------------------------------------ No results for input(s): TSH, T4TOTAL, T3FREE, THYROIDAB in the last 72 hours.  Invalid input(s): FREET3  Cardiac Enzymes No results for input(s): CKMB, TROPONINI, MYOGLOBIN in the last 168 hours.  Invalid input(s): CK  ------------------------------------------------------------------------------------------------------------------    Component Value Date/Time   BNP 165.4 (H) 06/29/2020 1348    CBG: Recent Labs  Lab 09/22/24 2045  GLUCAP 151*    Recent Results (from the past 240 hours)  Gram stain     Status: None   Collection Time: 09/24/24  3:27 PM   Specimen: Lung, Right; Pleural Fluid  Result Value Ref Range Status   Specimen Description PLEURAL  Final   Special  Requests LUNG RIGHT  Final   Gram Stain   Final    RARE WBC PRESENT, PREDOMINANTLY MONONUCLEAR NO ORGANISMS SEEN Performed at [facility], [address] [place], [place] [address]    Report Status 09/24/2024 FINAL  Final  Culture, body fluid w Gram Stain-bottle     Status: None (Preliminary result)   Collection Time: 09/24/24  3:27 PM   Specimen: Pleura  Result Value Ref Range Status   Specimen Description PLEURAL  Final   Special Requests LUNG RIGHT  Final   Culture   Final    NO GROWTH 4 DAYS Performed at [facility], [address] [place], [place] [address]    Report Status PENDING  Incomplete     Radiology Studies: DG Chest 2 View Result Date: 09/27/2024 CLINICAL DATA:  Congestive heart failure. EXAM: CHEST - 2 VIEW  COMPARISON:  09/25/2024 FINDINGS: Patient is rotated. Cardiomegaly persists. Bilateral pleural effusions with improvement from prior exam. Improvement in pulmonary edema. No pneumothorax. IMPRESSION: Improving CHF. Improving pulmonary edema with decreasing pleural effusions. Electronically Signed   By: Andrea Gasman M.D.   On: 09/27/2024 13:50      Nilda Fendt, MD, PhD [facility]  Between 7 am - 7 pm I am available, please contact me via Amion (for emergencies) or Securechat (non urgent messages)  Between 7 pm - 7 am I am not available, please contact night coverage MD/APP via Amion  "

## 2024-09-28 NOTE — Progress Notes (Signed)
 Pt requested sleep aid, pt stated that she normally takes 5mg  Melatonin at home. Notified Sundil,MD. See new orders.  Lonell LITTIE Lyme, RN

## 2024-09-29 DIAGNOSIS — I214 Non-ST elevation (NSTEMI) myocardial infarction: Secondary | ICD-10-CM | POA: Diagnosis not present

## 2024-09-29 DIAGNOSIS — I5033 Acute on chronic diastolic (congestive) heart failure: Secondary | ICD-10-CM | POA: Diagnosis not present

## 2024-09-29 DIAGNOSIS — R579 Shock, unspecified: Secondary | ICD-10-CM | POA: Diagnosis not present

## 2024-09-29 DIAGNOSIS — R0603 Acute respiratory distress: Secondary | ICD-10-CM | POA: Diagnosis not present

## 2024-09-29 LAB — BASIC METABOLIC PANEL WITH GFR
Anion gap: 9 (ref 5–15)
BUN: 59 mg/dL — ABNORMAL HIGH (ref 8–23)
CO2: 31 mmol/L (ref 22–32)
Calcium: 8.5 mg/dL — ABNORMAL LOW (ref 8.9–10.3)
Chloride: 96 mmol/L — ABNORMAL LOW (ref 98–111)
Creatinine, Ser: 1.51 mg/dL — ABNORMAL HIGH (ref 0.44–1.00)
GFR, Estimated: 34 mL/min — ABNORMAL LOW
Glucose, Bld: 113 mg/dL — ABNORMAL HIGH (ref 70–99)
Potassium: 3.1 mmol/L — ABNORMAL LOW (ref 3.5–5.1)
Sodium: 136 mmol/L (ref 135–145)

## 2024-09-29 LAB — CBC
HCT: 25.8 % — ABNORMAL LOW (ref 36.0–46.0)
Hemoglobin: 8.1 g/dL — ABNORMAL LOW (ref 12.0–15.0)
MCH: 27.6 pg (ref 26.0–34.0)
MCHC: 31.4 g/dL (ref 30.0–36.0)
MCV: 88.1 fL (ref 80.0–100.0)
Platelets: 150 K/uL (ref 150–400)
RBC: 2.93 MIL/uL — ABNORMAL LOW (ref 3.87–5.11)
RDW: 14.9 % (ref 11.5–15.5)
WBC: 7.6 K/uL (ref 4.0–10.5)
nRBC: 0 % (ref 0.0–0.2)

## 2024-09-29 LAB — CULTURE, BODY FLUID W GRAM STAIN -BOTTLE: Culture: NO GROWTH

## 2024-09-29 LAB — MAGNESIUM: Magnesium: 1.8 mg/dL (ref 1.7–2.4)

## 2024-09-29 MED ORDER — POTASSIUM CHLORIDE CRYS ER 20 MEQ PO TBCR
40.0000 meq | EXTENDED_RELEASE_TABLET | ORAL | Status: AC
  Administered 2024-09-29 (×2): 40 meq via ORAL
  Filled 2024-09-29 (×2): qty 2

## 2024-09-29 NOTE — Plan of Care (Signed)
  Problem: Education: Goal: Knowledge of General Education information will improve Description: Including pain rating scale, medication(s)/side effects and non-pharmacologic comfort measures Outcome: Progressing   Problem: Health Behavior/Discharge Planning: Goal: Ability to manage health-related needs will improve Outcome: Progressing   Problem: Clinical Measurements: Goal: Ability to maintain clinical measurements within normal limits will improve Outcome: Progressing Goal: Will remain free from infection Outcome: Progressing Goal: Diagnostic test results will improve Outcome: Progressing Goal: Respiratory complications will improve Outcome: Progressing Goal: Cardiovascular complication will be avoided Outcome: Progressing   Problem: Activity: Goal: Risk for activity intolerance will decrease Outcome: Progressing   Problem: Nutrition: Goal: Adequate nutrition will be maintained Outcome: Progressing   Problem: Coping: Goal: Level of anxiety will decrease Outcome: Progressing   Problem: Elimination: Goal: Will not experience complications related to bowel motility Outcome: Progressing Goal: Will not experience complications related to urinary retention Outcome: Progressing   Problem: Pain Managment: Goal: General experience of comfort will improve and/or be controlled Outcome: Progressing   Problem: Safety: Goal: Ability to remain free from injury will improve Outcome: Progressing   Problem: Skin Integrity: Goal: Risk for impaired skin integrity will decrease Outcome: Progressing   Problem: Education: Goal: Understanding of CV disease, CV risk reduction, and recovery process will improve Outcome: Progressing Goal: Individualized Educational Video(s) Outcome: Progressing   Problem: Activity: Goal: Ability to return to baseline activity level will improve Outcome: Progressing   Problem: Cardiovascular: Goal: Ability to achieve and maintain adequate  cardiovascular perfusion will improve Outcome: Progressing Goal: Vascular access site(s) Level 0-1 will be maintained Outcome: Progressing

## 2024-09-29 NOTE — Plan of Care (Signed)
   Problem: Education: Goal: Knowledge of General Education information will improve Description Including pain rating scale, medication(s)/side effects and non-pharmacologic comfort measures Outcome: Progressing

## 2024-09-29 NOTE — Progress Notes (Signed)
 "  Progress Note  Patient Name: Alyssa Hudson Date of Encounter: 09/29/2024  Primary Cardiologist: Jennifer JONELLE Crape, MD   Subjective   Doing well with no complaints. Says she had good output with the torsemide  yesterday but it seems a little slower than the IV diuretics, which is to be expected. Has not received her AM dose yet. Otherwise no complaints.   Inpatient Medications    Scheduled Meds:  amiodarone   200 mg Oral BID   apixaban   2.5 mg Oral BID   aspirin  EC  81 mg Oral Daily   atorvastatin   40 mg Oral Daily   benzonatate   100 mg Oral TID   clopidogrel   75 mg Oral Q breakfast   feeding supplement  237 mL Oral BID BM   guaiFENesin   600 mg Oral BID   isosorbide  mononitrate  30 mg Oral Daily   losartan   25 mg Oral Daily   melatonin  5 mg Oral QHS   pantoprazole   40 mg Oral QHS   sodium chloride  flush  3 mL Intravenous Q12H   torsemide   40 mg Oral Daily   Continuous Infusions:  PRN Meds: acetaminophen , ALPRAZolam , hydrALAZINE , ipratropium-albuterol , labetalol , ondansetron  (ZOFRAN ) IV, mouth rinse, sodium chloride  flush   Vital Signs    Vitals:   09/28/24 2241 09/29/24 0400 09/29/24 0721 09/29/24 0815  BP: (!) 126/48 (!) 134/54 (!) 143/53   Pulse: 69 67    Resp: 20 20    Temp: 97.7 F (36.5 C) 97.7 F (36.5 C) 97.6 F (36.4 C)   TempSrc: Oral Axillary Oral   SpO2: 92% 97%    Weight:  71.1 kg  65.5 kg  Height:        Intake/Output Summary (Last 24 hours) at 09/29/2024 0833 Last data filed at 09/29/2024 0442 Gross per 24 hour  Intake 720 ml  Output 1100 ml  Net -380 ml   Filed Weights   09/28/24 0400 09/29/24 0400 09/29/24 0815  Weight: 69.7 kg 71.1 kg 65.5 kg    Telemetry    Normal sinus rhythm - Personally Reviewed  ECG    Normal sinus rhythm with Mobitz type II- Personally Reviewed  Physical Exam   Physical Exam Vitals and nursing note reviewed.  Constitutional:      Appearance: Normal appearance.  HENT:     Head: Normocephalic and  atraumatic.  Eyes:     Conjunctiva/sclera: Conjunctivae normal.  Cardiovascular:     Rate and Rhythm: Normal rate and regular rhythm.  Pulmonary:     Effort: Pulmonary effort is normal.     Breath sounds: Rales present.  Musculoskeletal:        General: No swelling or tenderness.  Skin:    Coloration: Skin is not jaundiced or pale.  Neurological:     Mental Status: She is alert. Mental status is at baseline.      Labs    Chemistry Recent Labs  Lab 09/24/24 1059 09/25/24 0253 09/26/24 0235 09/27/24 0214 09/28/24 0210 09/29/24 0141  NA 134*   < > 132* 136 137 136  K 4.4   < > 3.9 3.0* 3.5 3.1*  CL 95*   < > 93* 95* 98 96*  CO2 26   < > 28 29 28 31   GLUCOSE 139*   < > 120* 116* 114* 113*  BUN 54*   < > 71* 68* 61* 59*  CREATININE 1.87*   < > 1.90* 1.88* 1.59* 1.51*  CALCIUM  8.9   < > 8.7* 8.6*  8.9 8.5*  PROT 6.5  --  6.3*  --  6.1*  --   ALBUMIN  3.2*  --  3.1*  --  2.9*  --   AST <10*  --  14*  --  15  --   ALT 7  --  7  --  11  --   ALKPHOS 67  --  66  --  72  --   BILITOT 0.8  --  0.9  --  0.8  --   GFRNONAA 26*   < > 25* 26* 31* 34*  ANIONGAP 13   < > 12 12 11 9    < > = values in this interval not displayed.     Hematology Recent Labs  Lab 09/27/24 0214 09/28/24 0210 09/29/24 0141  WBC 15.4* 11.2* 7.6  RBC 2.94* 2.96* 2.93*  HGB 8.2* 8.2* 8.1*  HCT 25.8* 25.8* 25.8*  MCV 87.8 87.2 88.1  MCH 27.9 27.7 27.6  MCHC 31.8 31.8 31.4  RDW 14.9 15.1 14.9  PLT 157 157 150    Cardiac EnzymesNo results for input(s): TROPONINI in the last 168 hours. No results for input(s): TROPIPOC in the last 168 hours.   BNP Recent Labs  Lab 09/23/24 1815  PROBNP >35,000.0*     DDimer No results for input(s): DDIMER in the last 168 hours.   Radiology    DG Chest 2 View Result Date: 09/27/2024 CLINICAL DATA:  Congestive heart failure. EXAM: CHEST - 2 VIEW COMPARISON:  09/25/2024 FINDINGS: Patient is rotated. Cardiomegaly persists. Bilateral pleural effusions  with improvement from prior exam. Improvement in pulmonary edema. No pneumothorax. IMPRESSION: Improving CHF. Improving pulmonary edema with decreasing pleural effusions. Electronically Signed   By: Andrea Gasman M.D.   On: 09/27/2024 13:50    Cardiac Studies   LHC 09/15/2024:   Mid LM to Dist LM lesion is 30% stenosed.   Prox LAD to Mid LAD lesion is 90% stenosed.   Prox Cx to Mid Cx lesion is 95% stenosed.   Dist Cx lesion is 80% stenosed.   1st Diag-2 lesion is 90% stenosed.   1st Diag-1 lesion is 80% stenosed.   Prox RCA lesion is 50% stenosed.   Mid RCA lesion is 85% stenosed.   1st Mrg lesion is 50% stenosed.   A drug-eluting stent was successfully placed using a STENT SYNERGY XD [id number].   A drug-eluting stent was successfully placed using a STENT SYNERGY XD [id number].   Post intervention, there is a 0% residual stenosis.   Post intervention, there is a 0% residual stenosis.   LV end diastolic pressure is moderately elevated.   Recommend uninterrupted dual antiplatelet therapy with Aspirin  81mg  daily and Clopidogrel  75mg  daily for a minimum of 12 months (ACS-Class I recommendation).   1.  Patent left main with mild nonobstructive distal vessel stenosis 2.  Severe proximal LAD stenosis, treated successfully with a 2.5 x 20 mm Synergy DES 3.  Severe proximal circumflex stenosis treated successfully with overlapping 2.75 x 24 mm and 3.0 x 16 mm Synergy DES 4.  Severe mid to distal RCA stenosis and moderate diffuse proximal to mid RCA stenosis 5.  Severe residual stenosis in a large diagonal/intermediate branch and distal circumflex 6.  Moderately elevated LVEDP of 21 mmHg  Echo 09/14/24:   1. Left ventricular ejection fraction, by estimation, is 55 to 60%. Left  ventricular ejection fraction by 2D MOD biplane is 57.2 %. The left  ventricle has normal function. The left ventricle has no  regional wall  motion abnormalities. Left ventricular  diastolic parameters are consistent  with Grade III diastolic dysfunction  (restrictive). Elevated left atrial pressure.   2. Right ventricular systolic function is normal. The right ventricular  size is mildly enlarged. There is moderately elevated pulmonary artery  systolic pressure. The estimated right ventricular systolic pressure is  50.0 mmHg.   3. Left atrial size was moderately dilated.   4. The mitral valve is degenerative. Mild to moderate mitral valve  regurgitation.   5. Tricuspid valve regurgitation is moderate.   6. The aortic valve is calcified. Aortic valve regurgitation is not  visualized. Aortic valve sclerosis/calcification is present, without any  evidence of aortic stenosis.   7. The inferior vena cava is dilated in size with <50% respiratory  variability, suggesting right atrial pressure of 15 mmHg.   Comparison(s): Changes from prior study are noted. EF is similar and TR is  now moderate.    Patient Profile     84 y.o. female with a past medical history of chronic diastolic heart failure, paroxysmal atrial fibrillation, viral pericarditis with pericardial effusion in 2021, hypertension, obesity who initially presented to the ED on 12/20 with shortness of breath x 2 months and acutely worsening over the week prior to admission with decreased appetite.  She was hypertensive and tachypneic in the ED requiring nonrebreather mask, WBC 24 and troponin 195-198-> 254.  proBNP 18,000 and CXR with bilateral pneumonia.  She began on diuresis for diastolic heart failure exacerbation.  A rapid response was called on 12/21 due to respiratory distress and chest pain and placed on BiPAP.  Found to have diffuse ST depressions in inferolateral leads and due to concern for evolving ACS patient underwent urgent left heart catheterization with Dr. Wonda on 09/15/2024 s/p DES to proximal LAD and 2 overlapping stents to proximal circumflex with residual disease in RCA and diagonal, LVEDP 21.  She was intubated post cath and  extubated 12/23.  Assessment & Plan   Acute on chronic diastolic heart failure-started on torsemide  40 mg daily 09/28/2024.  Not a candidate for SGLT2 inhibitors due to UTI.   NSTEMI on 09/15/2024 s/p DES to proximal LAD and 2 overlapping stents to proximal circumflex with severe multivessel disease-stable and chest free.  Currently on triple therapy (DAPT and DOAC x 30 days), Imdur  30 mg, atorvastatin  40 mg Multifactorial shock: Cardiogenic and septic (pneumonia and UTI)-resolved Paroxysmal atrial fibrillation with history of paroxysmal A-fib-currently normal sinus rhythm on amiodarone  200 mg twice daily, Eliquis  2.5 mg twice daily, aspirin  and clopidogrel  post cath.  Previously off anticoagulation due to patient preference Hypertension-start losartan  25 mg (on 100 mg at home) Sinus rhythm with blocked PACs S/p right thoracentesis on 09/24/2024 AKI on CKD-improving Mobitz type II on ECG with history of Mobitz 1 and 2-currently normal sinus rhythm with blocked PACs.  Continue to hold beta-blocker  Recommendations: Continue torsemide  40 mg daily, Imdur  30 mg, atorvastatin  40 mg.  Triple therapy with aspirin , Plavix  and Eliquis  x 30 days from 09/15/2025 followed by Plavix  and Eliquis  only. Advised to call the office or go to the ED if she has shortness of breath or rapid weight gain.  Dispo: Has an appointment scheduled on 10/03/2024 with Dr. Edwyna  No further recommendations at this time, cardiology will sign off. Please do not hesitate to reach back out with further questions.       For questions or updates, please contact [facility] Please consult [email or web address] for contact info under  Bonney Emeline Calender, DO 09/29/2024, 8:33 AM    "

## 2024-09-29 NOTE — Progress Notes (Addendum)
 " PROGRESS NOTE  Ader Fritze FMW:[id number] DOB: June 14, 1941 DOA: 09/14/2024 PCP: [name], MD   LOS: 15 days   Brief Narrative / Interim history: 84 year old female with history of aortic stenosis, chronic diastolic CHF, chronic A-fib, CAD, HTN comes into the hospital with cough, shortness of breath.  She was found to have pneumonia, but also had significant troponin elevation and cardiology was consulted.  She underwent a cardiac cath with stent placement to LAD.  Hospital course complicated by flash pulmonary edema having to be intubated, eventually was transferred out of the ICU on 12/25.  She has recurrent flash pulmonary edema on 12/26, and also right-sided large pleural effusion s/p thoracentesis 12/30.  She had recurrent pulmonary edema and shortness of breath overnight 12/30-12/31  Subjective / 24h Interval events: She continues to do well today, denies any shortness of breath.  Slept well last night.  Assesement and Plan: Principal problem CAD, Non-STEMI -cardiology consulted and followed patient while hospitalized.  Underwent a cardiac catheterization and is now status post PCI to DES to LAD.  She also has severe triple-vessel disease.  Currently she is on Eliquis , aspirin  and Plavix , she will need to come off aspirin  on 10/16/2024 at 30 days after her cath - Continue statin  Active problems Acute on chronic diastolic CHF -patient initially diuresed with IV and transition to p.o. Lasix , however had recurrent pulmonary edema and respiratory distress.  She was placed back on IV furosemide  again with improvement in her respiratory status.  - Given failure to p.o. furosemide , discussed with cardiology and will switch to torsemide  with intermittent metolazone .  Chest x-ray done on 1/3 shows significant improvement in her aeration and this is correlated clinically with her being on room air - Increase torsemide  seems 1/3, doing well, weight continues to improve and she is down to  144 pounds from 163 at 1 point.  Continue torsemide   New onset A-fib -cardiology following, currently on oral amiodarone , Eliquis .  Telemetry unremarkable  Cardiogenic shock -was in the ICU briefly on pressors.  Now weaned off and stable on the floor.  Hypokalemia-replace potassium and continue to monitor  Acute hypoxic respiratory failure -multifactorial in the setting of pneumonia, non-STEMI, flash pulmonary edema.  She remains on room air  Possible pneumonia -patient completed a course of antibiotics with IV Zosyn .  She did have a rise in her white count on 12/31, improved today.  She is afebrile.  Hold further antibiotics, leukocytosis improving  AKI on CKD 3B-creatinine appears to be 1.4 at baseline.  Creatinine peaked at 2.5, likely due to shock.  Creatinine now stable around 1.8-1.9 with ongoing diuresis, but overall stable and without large fluctuations.  Down to 1.6 today  Acute blood loss anemia -after an injury and a fall, has significant left leg hematoma.  She is also iron  deficient status post IV iron .  She is status post a unit of packed red blood cells on 12/22  Scheduled Meds:  amiodarone   200 mg Oral BID   apixaban   2.5 mg Oral BID   aspirin  EC  81 mg Oral Daily   atorvastatin   40 mg Oral Daily   benzonatate   100 mg Oral TID   clopidogrel   75 mg Oral Q breakfast   feeding supplement  237 mL Oral BID BM   guaiFENesin   600 mg Oral BID   isosorbide  mononitrate  30 mg Oral Daily   losartan   25 mg Oral Daily   melatonin  5 mg Oral QHS  pantoprazole   40 mg Oral QHS   potassium chloride   40 mEq Oral Q2H   sodium chloride  flush  3 mL Intravenous Q12H   torsemide   40 mg Oral Daily   Continuous Infusions: PRN Meds:.acetaminophen , ALPRAZolam , hydrALAZINE , ipratropium-albuterol , labetalol , ondansetron  (ZOFRAN ) IV, mouth rinse, sodium chloride  flush  Current Outpatient Medications  Medication Instructions   amLODipine  (NORVASC ) 10 mg, Oral, Daily   aspirin  EC 81 mg, Oral,  Daily, Swallow whole.   aspirin  EC 325 mg, Oral, Daily PRN   Biotin 5,000 mcg, Oral, Daily   CRANBERRY PO 650 mg, Oral, Daily   furosemide  (LASIX ) 40 mg, Oral, Daily, Not taking on therapy days   hydrochlorothiazide  (HYDRODIURIL ) 25 mg, Oral, Daily, Takes only on therapy days.   losartan  (COZAAR ) 100 mg, Oral, Daily   MAGNESIUM  PO 400 mg, Daily   metoprolol  succinate (TOPROL -XL) 50 mg, Oral, Daily   OVER THE COUNTER MEDICATION 4 capsules, Daily   Probiotic Product (PROBIOTIC PO) 1 tablet, Oral, Daily    Diet Orders (From admission, onward)     Start     Ordered   09/20/24 0908  Diet Heart Room service appropriate? Yes; Fluid consistency: Thin  Diet effective now       Question Answer Comment  Room service appropriate? Yes   Fluid consistency: Thin      09/20/24 0908            DVT prophylaxis: apixaban  (ELIQUIS ) tablet 2.5 mg Start: 09/17/24 1615 apixaban  (ELIQUIS ) tablet 2.5 mg   Lab Results  Component Value Date   PLT 150 09/29/2024      Code Status: Do not attempt resuscitation (DNR) PRE-ARREST INTERVENTIONS DESIRED  Family Communication: Nephew at bedside  Status is: Inpatient Remains inpatient appropriate because: Severity of illness   Level of care: Progressive  Consultants:  Cardiology  Objective: Vitals:   09/29/24 0400 09/29/24 0721 09/29/24 0815 09/29/24 1103  BP: (!) 134/54 (!) 143/53  (!) 127/56  Pulse: 67     Resp: 20     Temp: 97.7 F (36.5 C) 97.6 F (36.4 C)  98.7 F (37.1 C)  TempSrc: Axillary Oral  Oral  SpO2: 97%     Weight: 71.1 kg  65.5 kg   Height:        Intake/Output Summary (Last 24 hours) at 09/29/2024 1104 Last data filed at 09/29/2024 0900 Gross per 24 hour  Intake 720 ml  Output 1100 ml  Net -380 ml   Wt Readings from Last 3 Encounters:  09/29/24 65.5 kg  07/31/24 72.9 kg  07/28/23 74.1 kg    Examination:  Constitutional: NAD Eyes: lids and conjunctivae normal, no scleral icterus ENMT: mmm Neck: normal,  supple Respiratory: clear to auscultation bilaterally, no wheezing, no crackles. Normal respiratory effort.  Cardiovascular: Regular rate and rhythm, no murmurs / rubs / gallops. No LE edema. Abdomen: soft, no distention, no tenderness. Bowel sounds positive.   Data Reviewed: I have independently reviewed following labs and imaging studies   CBC Recent Labs  Lab 09/23/24 1815 09/24/24 1059 09/25/24 0253 09/26/24 0235 09/27/24 0214 09/28/24 0210 09/29/24 0141  WBC 17.8*   < > 19.1* 15.8* 15.4* 11.2* 7.6  HGB 8.3*   < > 9.2* 8.3* 8.2* 8.2* 8.1*  HCT 26.3*   < > 28.4* 25.4* 25.8* 25.8* 25.8*  PLT 196   < > 184 190 157 157 150  MCV 89.2   < > 87.9 85.8 87.8 87.2 88.1  MCH 28.1   < > 28.5  28.0 27.9 27.7 27.6  MCHC 31.6   < > 32.4 32.7 31.8 31.8 31.4  RDW 14.8   < > 14.7 14.9 14.9 15.1 14.9  LYMPHSABS 2.2  --   --   --   --   --   --   MONOABS 2.8*  --   --   --   --   --   --   EOSABS 0.0  --   --   --   --   --   --   BASOSABS 0.1  --   --   --   --   --   --    < > = values in this interval not displayed.    Recent Labs  Lab 09/23/24 1815 09/24/24 1059 09/25/24 0253 09/25/24 1342 09/26/24 0235 09/27/24 0214 09/28/24 0210 09/29/24 0141  NA  --  134* 133*  --  132* 136 137 136  K  --  4.4 4.6  --  3.9 3.0* 3.5 3.1*  CL  --  95* 95*  --  93* 95* 98 96*  CO2  --  26 26  --  28 29 28 31   GLUCOSE  --  139* 131*  --  120* 116* 114* 113*  BUN  --  54* 58*  --  71* 68* 61* 59*  CREATININE  --  1.87* 1.88*  --  1.90* 1.88* 1.59* 1.51*  CALCIUM   --  8.9 8.8*  --  8.7* 8.6* 8.9 8.5*  AST  --  <10*  --   --  14*  --  15  --   ALT  --  7  --   --  7  --  11  --   ALKPHOS  --  67  --   --  66  --  72  --   BILITOT  --  0.8  --   --  0.9  --  0.8  --   ALBUMIN   --  3.2*  --   --  3.1*  --  2.9*  --   MG  --   --  2.1  --  2.1 1.9 1.9 1.8  CRP 6.0*  --   --   --   --   --   --   --   PROCALCITON 0.25  --   --   --   --   --   --   --   LATICACIDVEN  --   --   --  2.1*  --   --    --   --     ------------------------------------------------------------------------------------------------------------------ No results for input(s): CHOL, HDL, LDLCALC, TRIG, CHOLHDL, LDLDIRECT in the last 72 hours.  Lab Results  Component Value Date   HGBA1C 5.4 11/18/2020   ------------------------------------------------------------------------------------------------------------------ No results for input(s): TSH, T4TOTAL, T3FREE, THYROIDAB in the last 72 hours.  Invalid input(s): FREET3  Cardiac Enzymes No results for input(s): CKMB, TROPONINI, MYOGLOBIN in the last 168 hours.  Invalid input(s): CK  ------------------------------------------------------------------------------------------------------------------    Component Value Date/Time   BNP 165.4 (H) 06/29/2020 1348    CBG: Recent Labs  Lab 09/22/24 2045  GLUCAP 151*    Recent Results (from the past 240 hours)  Gram stain     Status: None   Collection Time: 09/24/24  3:27 PM   Specimen: Lung, Right; Pleural Fluid  Result Value Ref Range Status   Specimen Description PLEURAL  Final   Special Requests LUNG RIGHT  Final  Gram Stain   Final    RARE WBC PRESENT, PREDOMINANTLY MONONUCLEAR NO ORGANISMS SEEN Performed at [facility], [address], [place], [place] [address]    Report Status 09/24/2024 FINAL  Final  Culture, body fluid w Gram Stain-bottle     Status: None   Collection Time: 09/24/24  3:27 PM   Specimen: Pleura  Result Value Ref Range Status   Specimen Description PLEURAL  Final   Special Requests LUNG RIGHT  Final   Culture   Final    NO GROWTH 5 DAYS Performed at [facility], [address], [place], [place] [address]    Report Status 09/29/2024 FINAL  Final     Radiology Studies: No results found.     Nilda Fendt, MD, PhD [facility]  Between 7 am - 7 pm I am available, please contact me via Amion (for emergencies) or  Securechat (non urgent messages)  Between 7 pm - 7 am I am not available, please contact night coverage MD/APP via Amion  "

## 2024-09-29 NOTE — Progress Notes (Signed)
 Mobility Specialist Progress Note;   09/29/24 1008  Mobility  Activity Ambulated with assistance;Pivoted/transferred from bed to chair  Level of Assistance Contact guard assist, steadying assist  Assistive Device Front wheel walker  Distance Ambulated (ft) 10 ft  Activity Response Tolerated well  Mobility Referral Yes  Mobility visit 1 Mobility  Mobility Specialist Start Time (ACUTE ONLY) 1008  Mobility Specialist Stop Time (ACUTE ONLY) 1019  Mobility Specialist Time Calculation (min) (ACUTE ONLY) 11 min   Pt pleasant and agreeable to mobility. Required MinG assistance for all mobility. Pt able to ambulate short household distance within room before fatiguing and requesting to sit in chair. VSS on RA throughout. Pt left comfortably in chair with all needs met, call bell in reach. Nephew present.   Lauraine Erm Mobility Specialist Please contact via SecureChat or [employer] [phone]

## 2024-09-30 LAB — BASIC METABOLIC PANEL WITH GFR
Anion gap: 9 (ref 5–15)
BUN: 64 mg/dL — ABNORMAL HIGH (ref 8–23)
CO2: 30 mmol/L (ref 22–32)
Calcium: 8.6 mg/dL — ABNORMAL LOW (ref 8.9–10.3)
Chloride: 97 mmol/L — ABNORMAL LOW (ref 98–111)
Creatinine, Ser: 1.66 mg/dL — ABNORMAL HIGH (ref 0.44–1.00)
GFR, Estimated: 30 mL/min — ABNORMAL LOW
Glucose, Bld: 117 mg/dL — ABNORMAL HIGH (ref 70–99)
Potassium: 4 mmol/L (ref 3.5–5.1)
Sodium: 135 mmol/L (ref 135–145)

## 2024-09-30 LAB — MAGNESIUM: Magnesium: 1.8 mg/dL (ref 1.7–2.4)

## 2024-09-30 MED ORDER — METOLAZONE 2.5 MG PO TABS: 2.5000 mg | ORAL_TABLET | ORAL | Status: DC

## 2024-09-30 MED ORDER — CLOPIDOGREL BISULFATE 75 MG PO TABS: 75.0000 mg | ORAL_TABLET | Freq: Every day | ORAL | Status: DC

## 2024-09-30 MED ORDER — TORSEMIDE 40 MG PO TABS: 40.0000 mg | ORAL_TABLET | Freq: Every day | ORAL | Status: DC

## 2024-09-30 MED ORDER — APIXABAN 2.5 MG PO TABS: 2.5000 mg | ORAL_TABLET | Freq: Two times a day (BID) | ORAL | Status: DC

## 2024-09-30 MED ORDER — AMIODARONE HCL 200 MG PO TABS: ORAL_TABLET | ORAL | Status: DC

## 2024-09-30 MED ORDER — ATORVASTATIN CALCIUM 40 MG PO TABS: 40.0000 mg | ORAL_TABLET | Freq: Every day | ORAL | Status: DC

## 2024-09-30 MED ORDER — METOLAZONE 2.5 MG PO TABS
2.5000 mg | ORAL_TABLET | Freq: Once | ORAL | Status: AC
  Administered 2024-09-30: 2.5 mg via ORAL
  Filled 2024-09-30: qty 1

## 2024-09-30 MED ORDER — LOSARTAN POTASSIUM 25 MG PO TABS: 25.0000 mg | ORAL_TABLET | Freq: Every day | ORAL | Status: DC

## 2024-09-30 MED ORDER — PANTOPRAZOLE SODIUM 40 MG PO TBEC: 40.0000 mg | DELAYED_RELEASE_TABLET | Freq: Every day | ORAL | Status: DC

## 2024-09-30 MED ORDER — MELATONIN 5 MG PO TABS: 5.0000 mg | ORAL_TABLET | Freq: Every day | ORAL | Status: DC

## 2024-09-30 MED ORDER — ISOSORBIDE MONONITRATE ER 30 MG PO TB24: 30.0000 mg | ORAL_TABLET | Freq: Every day | ORAL | Status: DC

## 2024-09-30 MED ORDER — ALPRAZOLAM 0.25 MG PO TABS: 0.2500 mg | ORAL_TABLET | Freq: Three times a day (TID) | ORAL | 0 refills | Status: DC | PRN

## 2024-09-30 NOTE — TOC Transition Note (Signed)
 Transition of Care [facility]) - Discharge Note   Patient Details  Name: Alyssa Hudson MRN: [id number] Date of Birth: September 20, 1941  [facility]) CM/SW Contact:  Luann SHAUNNA Cumming, LCSW Phone Number: 09/30/2024, 2:12 PM   Clinical Narrative:     SNF auth approved -09/30/24-10/02/24 AuthID# [id number]  Per MD patient ready for DC to [facility]. RN, patient, patient's family, and facility notified of DC. Discharge Summary and FL2 sent to facility. RN to call report prior to discharge [phone]). DC packet on chart. Ambulance transport requested for patient.   CSW will sign off for now as social work intervention is no longer needed. Please consult us  again if new needs arise.    Final next level of care: Skilled Nursing Facility Barriers to Discharge: No Barriers Identified   Patient Goals and CMS Choice Patient states their goals for this hospitalization and ongoing recovery are:: SNF CMS Medicare.gov Compare Post Acute Care list provided to:: Patient Choice offered to / list presented to : Patient      Discharge Placement              Patient chooses bed at: [facility] [place] Patient to be transferred to facility by: [facility]   Patient and family notified of of transfer: 09/30/24  Discharge Plan and Services Additional resources added to the After Visit Summary for   In-house Referral: Clinical Social Work Discharge Planning Services: CM Consult Post Acute Care Choice: Skilled Nursing Facility                               Social Drivers of Health (SDOH) Interventions SDOH Screenings   Food Insecurity: No Food Insecurity (09/19/2024)  Housing: Low Risk (09/19/2024)  Transportation Needs: No Transportation Needs (09/19/2024)  Utilities: Not At Risk (09/19/2024)  Social Connections: Socially Integrated (09/19/2024)  Tobacco Use: Low Risk (09/20/2024)     Readmission Risk Interventions     No data to display

## 2024-09-30 NOTE — Plan of Care (Signed)
" °  Problem: Education: Goal: Knowledge of General Education information will improve Description: Including pain rating scale, medication(s)/side effects and non-pharmacologic comfort measures Outcome: Progressing   Problem: Clinical Measurements: Goal: Diagnostic test results will improve Outcome: Progressing Goal: Respiratory complications will improve Outcome: Progressing Goal: Cardiovascular complication will be avoided Outcome: Progressing   Problem: Activity: Goal: Risk for activity intolerance will decrease Outcome: Progressing   Problem: Nutrition: Goal: Adequate nutrition will be maintained Outcome: Progressing   Problem: Elimination: Goal: Will not experience complications related to bowel motility Outcome: Progressing Goal: Will not experience complications related to urinary retention Outcome: Progressing   Problem: Pain Managment: Goal: General experience of comfort will improve and/or be controlled Outcome: Progressing   Problem: Safety: Goal: Ability to remain free from injury will improve Outcome: Progressing   Problem: Skin Integrity: Goal: Risk for impaired skin integrity will decrease Outcome: Progressing   Problem: Cardiovascular: Goal: Ability to achieve and maintain adequate cardiovascular perfusion will improve Outcome: Progressing   "

## 2024-09-30 NOTE — Discharge Summary (Signed)
 "  Physician Discharge Summary  Maui Ahart FMW:[id number] DOB: May 09, 1941 DOA: 09/14/2024  PCP: [name], MD  Admit date: 09/14/2024 Discharge date: 09/30/2024  Admitted From: home Disposition:  SNF  Recommendations for Outpatient Follow-up:  Follow up with PCP in 1-2 weeks Please obtain BMP/CBC in one week  Home Health: none Equipment/Devices: none  Discharge Condition: stable CODE STATUS: DNR Diet Orders (From admission, onward)     Start     Ordered   09/20/24 0908  Diet Heart Room service appropriate? Yes; Fluid consistency: Thin  Diet effective now       Question Answer Comment  Room service appropriate? Yes   Fluid consistency: Thin      09/20/24 0908            Brief Narrative / Interim history: 84 year old female with history of aortic stenosis, chronic diastolic CHF, chronic A-fib, CAD, HTN comes into the hospital with cough, shortness of breath.  She was found to have pneumonia, but also had significant troponin elevation and cardiology was consulted.  She underwent a cardiac cath with stent placement to LAD.  Hospital course complicated by flash pulmonary edema having to be intubated, eventually was transferred out of the ICU on 12/25.  Hospital Course / Discharge diagnoses: Principal problem CAD, Non-STEMI -cardiology consulted and followed patient while hospitalized.  Underwent a cardiac catheterization and is now status post PCI to DES to LAD.  She also has severe triple-vessel disease.  Currently she is on Eliquis , aspirin  and Plavix , she will need to come off aspirin  on 10/16/2024 at 30 days after her cath, and continue Eliquis  and Plavix  afterwards.  Continue statin as well.   Active problems Acute on chronic diastolic CHF -patient has been having issues with fluid overload, initially on IV Lasix , transition to p.o. furosemide , however had recurrent pulmonary edema.  She was diuresed again with IV furosemide , and eventually transition to  torsemide  alternating with Demadex  twice a week.  With this oral regimen she has remained stable, euvolemic, repeat chest x-ray showed significant improvement in her aeration, continue this regimen upon discharge.  Continue heart healthy/low-sodium diet on discharge.  Recommend daily weights and close monitoring for reaccumulation of fluid New onset A-fib -cardiology following, currently on oral amiodarone , Eliquis .  Telemetry unremarkable.  She is to continue amiodarone  200 mg twice daily for another 10 additional days, and then she should be on 200 mg daily for the time being  Cardiogenic shock -was in the ICU briefly on pressors.  Now weaned off and stable on the floor. Hypokalemia-improved Acute hypoxic respiratory failure -multifactorial in the setting of pneumonia, non-STEMI, flash pulmonary edema.  She was weaned to room air Possible pneumonia -she is status completed courses with antibiotics AKI on CKD 3B-creatinine appears to be 1.4 at baseline.  Creatinine peaked at 2.5, likely due to shock.  Creatinine now has returned to baseline and has stabilized over the last couple of days, tolerating diuretics well.  Continue to monitor intermittently as an outpatient Acute blood loss anemia -after an injury and a fall, has significant left leg hematoma.  She is also iron  deficient status post IV iron .  She is status post a unit of packed red blood cells on 12/22.  Hemoglobin has remained stable  Sepsis ruled out   Discharge Instructions  Discharge Instructions     Amb Referral to Cardiac Rehabilitation   Complete by: As directed    Diagnosis:  NSTEMI Coronary Stents     After initial evaluation  and assessments completed: Virtual Based Care may be provided alone or in conjunction with Phase 2 Cardiac Rehab based on patient barriers.: Yes   Intensive Cardiac Rehabilitation (ICR) [facility] location only OR Traditional Cardiac Rehabilitation (TCR) *If criteria for ICR are not met will enroll in TCR ([facility]  only): Yes      Allergies as of 09/30/2024       Reactions   Codeine Nausea Only, Nausea And Vomiting   Insomnia   Contrast Media [iodinated Contrast Media] Other (See Comments)   Turned red all over   Iodine-131 Hives, Swelling   Sulfa Antibiotics Nausea Only        Medication List     STOP taking these medications    amLODipine  10 MG tablet Commonly known as: NORVASC    furosemide  40 MG tablet Commonly known as: LASIX    hydrochlorothiazide  25 MG tablet Commonly known as: HYDRODIURIL    metoprolol  succinate 50 MG 24 hr tablet Commonly known as: TOPROL -XL       TAKE these medications    ALPRAZolam  0.25 MG tablet Commonly known as: XANAX  Take 1 tablet (0.25 mg total) by mouth 3 (three) times daily as needed for anxiety.   amiodarone  200 MG tablet Commonly known as: PACERONE  Take 1 tablet (200 mg total) by mouth 2 (two) times daily for 10 days, THEN 1 tablet (200 mg total) daily. Start taking on: September 30, 2024   apixaban  2.5 MG Tabs tablet Commonly known as: ELIQUIS  Take 1 tablet (2.5 mg total) by mouth 2 (two) times daily.   aspirin  EC 81 MG tablet Take 81 mg by mouth daily. Swallow whole. What changed: Another medication with the same name was removed. Continue taking this medication, and follow the directions you see here.   atorvastatin  40 MG tablet Commonly known as: LIPITOR Take 1 tablet (40 mg total) by mouth daily. Start taking on: October 01, 2024   Biotin 5000 MCG Tabs Take 5,000 mcg by mouth daily.   clopidogrel  75 MG tablet Commonly known as: PLAVIX  Take 1 tablet (75 mg total) by mouth daily with breakfast. Start taking on: October 01, 2024   CRANBERRY PO Take 650 mg by mouth daily.   isosorbide  mononitrate 30 MG 24 hr tablet Commonly known as: IMDUR  Take 1 tablet (30 mg total) by mouth daily. Start taking on: October 01, 2024   losartan  25 MG tablet Commonly known as: COZAAR  Take 1 tablet (25 mg total) by mouth daily. Start taking  on: October 01, 2024 What changed:  medication strength how much to take   MAGNESIUM  PO Take 400 mg by mouth daily.   melatonin 5 MG Tabs Take 1 tablet (5 mg total) by mouth at bedtime.   metolazone  2.5 MG tablet Commonly known as: ZAROXOLYN  Take 1 tablet (2.5 mg total) by mouth 2 (two) times a week.   OVER THE COUNTER MEDICATION Take 4 capsules by mouth daily. MACULAR PROTECT   pantoprazole  40 MG tablet Commonly known as: PROTONIX  Take 1 tablet (40 mg total) by mouth at bedtime.   PROBIOTIC PO Take 1 tablet by mouth daily.   Torsemide  40 MG Tabs Take 40 mg by mouth daily. Start taking on: October 01, 2024        Contact information for after-discharge care     Destination     [facility], [place] .   Service: Skilled Nursing Contact information: [address] [place]   [phone]  Consultations: Cardiology  Procedures/Studies:  DG Chest 2 View Result Date: 09/27/2024 CLINICAL DATA:  Congestive heart failure. EXAM: CHEST - 2 VIEW COMPARISON:  09/25/2024 FINDINGS: Patient is rotated. Cardiomegaly persists. Bilateral pleural effusions with improvement from prior exam. Improvement in pulmonary edema. No pneumothorax. IMPRESSION: Improving CHF. Improving pulmonary edema with decreasing pleural effusions. Electronically Signed   By: Andrea Gasman M.D.   On: 09/27/2024 13:50   DG CHEST PORT 1 VIEW Result Date: 09/25/2024 EXAM: 1 VIEW(S) XRAY OF THE CHEST 09/25/2024 01:41:17 AM COMPARISON: 09/24/2024 CLINICAL HISTORY: Acute respiratory distress FINDINGS: LUNGS AND PLEURA: Small bilateral pleural effusions, left greater than right. Diffuse interstitial opacities, progressed from prior. Left greater than right bibasilar opacities. No pneumothorax. HEART AND MEDIASTINUM: Stable cardiomegaly. Aortic atherosclerosis. BONES AND SOFT TISSUES: Chronic right humeral neck fracture. IMPRESSION: 1. Progression  of diffuse interstitial opacities and left greater than right bibasilar opacities. 2. Worsened small bilateral pleural effusions, left greater than right. 3. Stable cardiomegaly. Electronically signed by: Franky Stanford MD 09/25/2024 02:26 AM EST RP Workstation: [id number]   IR THORACENTESIS ASP PLEURAL SPACE W/IMG GUIDE Result Date: 09/24/2024 INDICATION: 84 year old female admitted with pneumonia. Presented to the hospital with cough and shortness of breath. Received request for diagnostic and therapeutic thoracentesis. EXAM: ULTRASOUND GUIDED DIAGNOSTIC AND THERAPEUTIC, RIGHT-SIDED THORACENTESIS MEDICATIONS: 4 mL 1% lidocaine  COMPLICATIONS: None immediate. PROCEDURE: An ultrasound guided thoracentesis was thoroughly discussed with the patient and questions answered. The benefits, risks, alternatives and complications were also discussed. The patient understands and wishes to proceed with the procedure. Written consent was obtained. Ultrasound was performed to localize and mark an adequate pocket of fluid in the right chest. The area was then prepped and draped in the normal sterile fashion. 1% lidocaine  was used for local anesthesia. Under ultrasound guidance a 6 Fr Safe-T-Centesis catheter was introduced. Thoracentesis was performed. The catheter was removed and a dressing applied. FINDINGS: A total of approximately 850 mL of cloudy amber fluid was removed. Samples were sent to the laboratory as requested by the clinical team. IMPRESSION: Successful ultrasound guided right thoracentesis yielding 850 mL of pleural fluid. Performed by: Kristi Davenport, NP and Dr. Juliene Balder Electronically Signed   By: Juliene Balder M.D.   On: 09/24/2024 19:06   DG Chest 1 View Result Date: 09/24/2024 EXAM: 1 AP VIEW(S) XRAY OF THE CHEST 09/24/2024 03:29:00 PM COMPARISON: 09/23/2024 CLINICAL HISTORY: Pleural effusion FINDINGS: LUNGS AND PLEURA: Mild pulmonary edema, slightly improved from prior. Trace right pleural effusion,  decreased from prior. Small left pleural effusion and left basilar opacity, stable. Left basilar atelectasis and/or consolidation, stable. No pneumothorax. HEART AND MEDIASTINUM: Aortic atherosclerosis. No acute abnormality of the cardiac and mediastinal silhouettes. BONES AND SOFT TISSUES: No acute osseous abnormality. IMPRESSION: 1. Trace right pleural effusion, decreased from prior. 2. Small left pleural effusion, stable. Left basilar opacity, stable. 3. Mild pulmonary edema, slightly improved from prior. Electronically signed by: Morgane Naveau MD 09/24/2024 04:30 PM EST RP Workstation: [id number]   CT CHEST WO CONTRAST Result Date: 09/24/2024 CLINICAL DATA:  Pneumonia with suspected complication. EXAM: CT CHEST WITHOUT CONTRAST TECHNIQUE: Multidetector CT imaging of the chest was performed following the standard protocol without IV contrast. RADIATION DOSE REDUCTION: This exam was performed according to the departmental dose-optimization program which includes automated exposure control, adjustment of the mA and/or kV according to patient size and/or use of iterative reconstruction technique. COMPARISON:  Chest radiographs dated 09/23/2024 and 09/20/2024 and chest CT dated 08/29/2024. FINDINGS: Cardiovascular: Atheromatous calcifications, including the  coronary arteries and aorta. Interval mild enlargement of the heart with no pericardial effusion seen. Mediastinum/Nodes: Mildly heterogeneous thyroid  gland with small nodules, the largest in the left lobe, measuring 1.5 cm in maximum diameter. Interval mildly enlarged precarinal node with a short axis diameter of 13 mm on image number 72/3. Unremarkable trachea and esophagus. Lungs/Pleura: The previously demonstrated 10 mm irregular right upper lobe nodule is unchanged, measuring 10 mm on image number 51/4. The previously demonstrated 3 mm nodule in the periphery of the right upper lobe is unchanged on image number 64/4. A small anterior right lower lobe  perifissural lymph node is unchanged on image number 75/4. Interval moderate-sized right pleural effusion and small to moderate-sized left pleural effusion. Interval bilateral lower lobe atelectasis. Interval minimal patchy density in the posterior right upper lobe. Interval peripheral, nodular areas of patchy and consolidated density in the left upper lobe with a small amount also demonstrated in the right upper lobe. Interval minimal diffuse ground-glass opacity in both lungs. Upper Abdomen: Atheromatous arterial calcifications. Musculoskeletal: Previously demonstrated old, impacted right humeral head fracture. Thoracic spine degenerative changes with changes of DISH. IMPRESSION: 1. Interval moderate-sized right pleural effusion and small to moderate-sized left pleural effusion with bilateral lower lobe atelectasis. 2. Interval minimal diffuse ground-glass opacity in both lungs, compatible with minimal pulmonary edema. 3. Interval peripheral, nodular areas of patchy and consolidated density in the left upper lobe with a small amount also demonstrated in the right upper lobe. These are most likely due to pneumonia. 4. Interval minimal patchy density in the posterior right upper lobe, compatible with pneumonia or minimal patchy atelectasis. 5. Interval mild cardiomegaly. 6. Stable right lung nodules, including 10 mm irregular right upper lobe nodule. This was recently recommended for PET-CT. Alternatively, non-contrast chest CT at 3-6 months could be obtained. If the nodules are stable at time of repeat CT, then future CT at 18-24 months (from today's scan) is considered optional for low-risk patients, but is recommended for high-risk patients. This recommendation follows the consensus statement: Guidelines for Management of Incidental Pulmonary Nodules Detected on CT Images: From the Fleischner Society 2017; Radiology 2017; 284:228-243. 7. Interval mildly enlarged precarinal node, most likely reactive. 8. 1.5 cm  incidental left thyroid  nodule. Recommend non-emergent thyroid  ultrasound if clinically warranted given patient age. Reference: J Am Coll Radiol. 2015 Feb;12(2): 143-50 9. Calcific coronary artery and aortic atherosclerosis. Aortic Atherosclerosis (ICD10-I70.0). Electronically Signed   By: Elspeth Bathe M.D.   On: 09/24/2024 16:25   DG Chest 2 View Result Date: 09/23/2024 EXAM: 2 VIEW(S) XRAY OF THE CHEST 09/23/2024 07:27:00 AM COMPARISON: 09/20/2024 CLINICAL HISTORY: SOB (shortness of breath) FINDINGS: LUNGS AND PLEURA: Mild pulmonary edema. Bilateral interstitial prominence. Hazy bibasilar opacities. Small bilateral pleural effusions, stable. No pneumothorax. HEART AND MEDIASTINUM: No acute abnormality of the cardiac and mediastinal silhouettes. BONES AND SOFT TISSUES: No acute osseous abnormality. IMPRESSION: 1. Similar appearance of mild pulmonary edema with hazy bibasilar opacities, likely atelectasis. 2. Unchanged small bilateral pleural effusions. Electronically signed by: Michaeline Blanch MD 09/23/2024 11:23 AM EST RP Workstation: [id number]   DG Chest Port 1 View Result Date: 09/20/2024 EXAM: 1 VIEW(S) XRAY OF THE CHEST 09/20/2024 11:29:00 PM COMPARISON: 09/17/2024 CLINICAL HISTORY: Acute respiratory distress FINDINGS: LINES, TUBES AND DEVICES: Endotracheal and enteric tubes removed. Left IJ CVC removed. LUNGS AND PLEURA: Small bilateral pleural effusions. Cephalization of pulmonary vasculature. Diffuse bilateral interstitial prominence. Probable bibasilar atelectasis. No pneumothorax. HEART AND MEDIASTINUM: Cardiomegaly. Aortic atherosclerosis. BONES AND SOFT TISSUES: No acute osseous  abnormality. IMPRESSION: 1. Cardiomegaly with mild pulmonary edema and small bilateral pleural effusions. Electronically signed by: Greig Pique MD 09/20/2024 11:43 PM EST RP Workstation: [id number]   DG Chest Port 1 View Result Date: 09/17/2024 CLINICAL DATA:  Pneumonia. EXAM: PORTABLE CHEST 1 VIEW COMPARISON:   09/16/2024 FINDINGS: Endotracheal tube tip is approximately 3.9 cm above the base of the carina. The NG tube passes into the stomach although the distal tip position is not included on the film. Left Port-A-Cath remains in place. Subtle atelectasis in the lung bases with small pleural effusions. Telemetry leads overlie the chest. IMPRESSION: 1. Endotracheal tube tip is approximately 3.9 cm above the base of the carina. 2. Bibasilar atelectasis with small pleural effusions. Electronically Signed   By: Camellia Candle M.D.   On: 09/17/2024 07:50   VAS US  LOWER EXTREMITY VENOUS (DVT) (7a-5p) Result Date: 09/16/2024  Lower Venous DVT Study Patient Name:  America Peavy  Date of Exam:   09/16/2024 Medical Rec #: [id number]        Accession #:    [id number] Date of Birth: [birth year]        Patient Gender: F Patient Age:   26 years Exam Location:  [facility] Procedure:      VAS US  LOWER EXTREMITY VENOUS (DVT) Referring Phys: MICHAEL COOPER --------------------------------------------------------------------------------  Indications: Swelling, Edema, SOB, and recent fall/injury.  Comparison Study: No prior exam on file. Performing Technologist: Edilia Elden Appl  Examination Guidelines: A complete evaluation includes B-mode imaging, spectral Doppler, color Doppler, and power Doppler as needed of all accessible portions of each vessel. Bilateral testing is considered an integral part of a complete examination. Limited examinations for reoccurring indications may be performed as noted. The reflux portion of the exam is performed with the patient in reverse Trendelenburg.  +-------+---------------+---------+-----------+----------+-------------------+ RIGHT  CompressibilityPhasicitySpontaneityPropertiesThrombus Aging      +-------+---------------+---------+-----------+----------+-------------------+ CFV                                                 Not well visualized  +-------+---------------+---------+-----------+----------+-------------------+ SFJ                                                 Not well visualized +-------+---------------+---------+-----------+----------+-------------------+ FV ProxFull           Yes      Yes                                      +-------+---------------+---------+-----------+----------+-------------------+ PFV    Full           Yes      Yes                                      +-------+---------------+---------+-----------+----------+-------------------+ Right common femoral and great saphenous at the saphenofemoral junction were not visualized due to bandages. Right proximal femoral and profunda vein evaluated instead.  +---------+---------------+---------+-----------+----------+--------------+ LEFT     CompressibilityPhasicitySpontaneityPropertiesThrombus Aging +---------+---------------+---------+-----------+----------+--------------+ CFV      Full           Yes  Yes                                 +---------+---------------+---------+-----------+----------+--------------+ SFJ      Full           Yes      Yes                                 +---------+---------------+---------+-----------+----------+--------------+ FV Prox  Full                                                        +---------+---------------+---------+-----------+----------+--------------+ FV Mid   Full                                                        +---------+---------------+---------+-----------+----------+--------------+ FV DistalFull                                                        +---------+---------------+---------+-----------+----------+--------------+ PFV      Full                                                        +---------+---------------+---------+-----------+----------+--------------+ POP      Full           Yes      Yes                                  +---------+---------------+---------+-----------+----------+--------------+ PTV      Full                                                        +---------+---------------+---------+-----------+----------+--------------+ PERO     Full                                                        +---------+---------------+---------+-----------+----------+--------------+ Left peroneal vein unable to fill with color however compressible.    Summary: RIGHT: - Patent proximal femoral vein and profunda vein.  LEFT: - There is no evidence of deep vein thrombosis in the lower extremity.  - No cystic structure found in the popliteal fossa.  *See table(s) above for measurements and observations. Electronically signed by Lonni Gaskins MD on 09/16/2024 at 2:56:43 PM.    Final    DG Chest Port 1 View Result Date: 09/16/2024 CLINICAL DATA:  Central line placement. EXAM: PORTABLE CHEST 1 VIEW COMPARISON:  09/15/2024 FINDINGS: Endotracheal tube tip is approximately 4.4 cm above the base of the carina. The NG tube passes into the stomach although the distal tip position is not included on the film. Left IJ central line tip is positioned near the SVC/RA junction. The cardio pericardial silhouette is enlarged. Vascular congestion with patchy basilar airspace disease is similar to prior. Tiny bilateral pleural effusions evident. Telemetry leads overlie the chest. IMPRESSION: 1. No substantial change. Patchy basilar airspace opacities with tiny effusions. Electronically Signed   By: Camellia Candle M.D.   On: 09/16/2024 07:26   DG CHEST PORT 1 VIEW Result Date: 09/15/2024 CLINICAL DATA:  Intubation and central line placement. EXAM: PORTABLE CHEST 1 VIEW COMPARISON:  Earlier today FINDINGS: Tip of the endotracheal tube 3 cm from the carina. Tip and side port of the enteric tube below the diaphragm in the stomach. Left-sided central line tip overlies the lower SVC. No pneumothorax. No change in bibasilar opacities since  earlier today. Stable heart size and mediastinal contours. Coronary stent. Suspect small pleural effusions. IMPRESSION: 1. Tip of the endotracheal tube 3 cm from the carina. Tip and side port of the enteric tube below the diaphragm in the stomach. 2. Left-sided central line tip overlies the lower SVC. No pneumothorax. 3. No change in bibasilar opacities since earlier today. Electronically Signed   By: Andrea Gasman M.D.   On: 09/15/2024 17:22   DG Chest Port 1 View Result Date: 09/15/2024 CLINICAL DATA:  Shortness of breath. EXAM: PORTABLE CHEST 1 VIEW COMPARISON:  Radiograph earlier CT 08/29/2024 FINDINGS: Worsening rounded airspace disease at the right lung base. Worsening ill-defined opacity at the left lung base. Small right in query small left pleural effusion. The right upper lobe pulmonary nodule on prior CT is not seen by radiograph. Stable heart size and mediastinal contours. No pneumothorax. Remote right proximal humeral fracture. IMPRESSION: 1. Worsening bibasilar aeration with increasing bibasilar opacities. 2. Small right and query small left pleural effusions. Electronically Signed   By: Andrea Gasman M.D.   On: 09/15/2024 16:11   CARDIAC CATHETERIZATION Result Date: 09/15/2024   Mid LM to Dist LM lesion is 30% stenosed.   Prox LAD to Mid LAD lesion is 90% stenosed.   Prox Cx to Mid Cx lesion is 95% stenosed.   Dist Cx lesion is 80% stenosed.   1st Diag-2 lesion is 90% stenosed.   1st Diag-1 lesion is 80% stenosed.   Prox RCA lesion is 50% stenosed.   Mid RCA lesion is 85% stenosed.   1st Mrg lesion is 50% stenosed.   A drug-eluting stent was successfully placed using a STENT SYNERGY XD 2.50X20.   A drug-eluting stent was successfully placed using a STENT SYNERGY XD [id number].   Post intervention, there is a 0% residual stenosis.   Post intervention, there is a 0% residual stenosis.   LV end diastolic pressure is moderately elevated.   Recommend uninterrupted dual antiplatelet therapy with  Aspirin  81mg  daily and Clopidogrel  75mg  daily for a minimum of 12 months (ACS-Class I recommendation). 1.  Patent left main with mild nonobstructive distal vessel stenosis 2.  Severe proximal LAD stenosis, treated successfully with a 2.5 x 20 mm Synergy DES 3.  Severe proximal circumflex stenosis treated successfully with overlapping 2.75 x 24 mm and 3.0 x 16 mm Synergy DES 4.  Severe mid to distal RCA stenosis and moderate diffuse proximal to mid RCA stenosis 5.  Severe residual  stenosis in a large diagonal/intermediate branch and distal circumflex 6.  Moderately elevated LVEDP of 21 mmHg Patient remained stable through two-vessel PCI treating the proximal LAD and proximal circumflex with hopes to reduce her ischemic burden.  A total of 110 cc of contrast are used for the procedure.  She will be monitored carefully in the CV-ICU.  Favor medical therapy for her residual CAD.  Orders written to continue cangrelor  infusion for a total of 2 hours, then load clopidogrel  600 mg orally at the time of cangrelor  discontinuation.  Hold on aggressive diuresis as long as her respiratory status is stable. Follow creatinine with morning labs.   DG CHEST PORT 1 VIEW Result Date: 09/15/2024 EXAM: 1 VIEW(S) XRAY OF THE CHEST 09/15/2024 12:02:47 PM COMPARISON: 09/14/2024 CLINICAL HISTORY: SOB (shortness of breath) FINDINGS: LUNGS AND PLEURA: Stable focal opacity at right lung base. Improving patchy opacities at left lung base. Small bilateral pleural effusions. No pneumothorax. HEART AND MEDIASTINUM: Aortic atherosclerosis. No acute abnormality of the cardiac and mediastinal silhouettes. BONES AND SOFT TISSUES: No acute osseous abnormality. IMPRESSION: 1. Stable focal opacity at right lung base and improving patchy opacities at left lung base. 2. Small bilateral pleural effusions. 3. Aortic atherosclerosis. Electronically signed by: Evalene Coho MD 09/15/2024 12:10 PM EST RP Workstation: [id number]   ECHOCARDIOGRAM  COMPLETE Result Date: 09/14/2024    ECHOCARDIOGRAM REPORT   Patient Name:   BILL MCVEY Date of Exam: 09/14/2024 Medical Rec #:  [id number]       Height:       66.0 in Accession #:    [id number]      Weight:       158.1 lb Date of Birth:  1941-07-19       BSA:          1.810 m Patient Age:    83 years        BP:           145/55 mmHg Patient Gender: F               HR:           56 bpm. Exam Location:  Inpatient Procedure: 2D Echo, Color Doppler, Cardiac Doppler and Intracardiac            Opacification Agent (Both Spectral and Color Flow Doppler were            utilized during procedure). Indications:    R94.31 Abnormal EKG  History:        Patient has prior history of Echocardiogram examinations, most                 recent 06/29/2020. Pulmonary HTN, Arrythmias:Atrial Fibrillation;                 Risk Factors:Hypertension.  Sonographer:    Damien Senior RDCS Referring Phys: [id number] JOSHUA G LONG  Sonographer Comments: Scanned upright on bipap IMPRESSIONS  1. Left ventricular ejection fraction, by estimation, is 55 to 60%. Left ventricular ejection fraction by 2D MOD biplane is 57.2 %. The left ventricle has normal function. The left ventricle has no regional wall motion abnormalities. Left ventricular diastolic parameters are consistent with Grade III diastolic dysfunction (restrictive). Elevated left atrial pressure.  2. Right ventricular systolic function is normal. The right ventricular size is mildly enlarged. There is moderately elevated pulmonary artery systolic pressure. The estimated right ventricular systolic pressure is 50.0 mmHg.  3. Left atrial size was moderately dilated.  4. The mitral valve is  degenerative. Mild to moderate mitral valve regurgitation.  5. Tricuspid valve regurgitation is moderate.  6. The aortic valve is calcified. Aortic valve regurgitation is not visualized. Aortic valve sclerosis/calcification is present, without any evidence of aortic stenosis.  7. The inferior vena cava is  dilated in size with <50% respiratory variability, suggesting right atrial pressure of 15 mmHg. Comparison(s): Changes from prior study are noted. EF is similar and TR is now moderate. FINDINGS  Left Ventricle: Left ventricular ejection fraction, by estimation, is 55 to 60%. Left ventricular ejection fraction by 2D MOD biplane is 57.2 %. The left ventricle has normal function. The left ventricle has no regional wall motion abnormalities. Definity  contrast agent was given IV to delineate the left ventricular endocardial borders. The left ventricular internal cavity size was normal in size. There is no left ventricular hypertrophy. Left ventricular diastolic function could not be evaluated  due to mitral annular calcification (moderate or greater). Left ventricular diastolic parameters are consistent with Grade III diastolic dysfunction (restrictive). Elevated left atrial pressure. Right Ventricle: The right ventricular size is mildly enlarged. No increase in right ventricular wall thickness. Right ventricular systolic function is normal. There is moderately elevated pulmonary artery systolic pressure. The tricuspid regurgitant velocity is 3.24 m/s, and with an assumed right atrial pressure of 8 mmHg, the estimated right ventricular systolic pressure is 50.0 mmHg. Left Atrium: Left atrial size was moderately dilated. Right Atrium: Right atrial size was normal in size. Pericardium: There is no evidence of pericardial effusion. Mitral Valve: The mitral valve is degenerative in appearance. Mild to moderate mitral annular calcification. Mild to moderate mitral valve regurgitation. MV peak gradient, 15.8 mmHg. The mean mitral valve gradient is 3.0 mmHg. Tricuspid Valve: The tricuspid valve is normal in structure. Tricuspid valve regurgitation is moderate . No evidence of tricuspid stenosis. Aortic Valve: The aortic valve is calcified. Aortic valve regurgitation is not visualized. Aortic valve sclerosis/calcification is  present, without any evidence of aortic stenosis. Pulmonic Valve: The pulmonic valve was not well visualized. Pulmonic valve regurgitation is not visualized. Aorta: The aortic root and ascending aorta are structurally normal, with no evidence of dilitation. Venous: The inferior vena cava is dilated in size with less than 50% respiratory variability, suggesting right atrial pressure of 15 mmHg. IAS/Shunts: No atrial level shunt detected by color flow Doppler.  LEFT VENTRICLE PLAX 2D                        Biplane EF (MOD) LVIDd:         5.20 cm         LV Biplane EF:   Left LVIDs:         3.40 cm                          ventricular LV PW:         0.80 cm                          ejection LV IVS:        0.90 cm                          fraction by LVOT diam:     1.90 cm  2D MOD LV SV:         65                               biplane is LV SV Index:   36                               57.2 %. LVOT Area:     2.84 cm                                Diastology                                LV e' medial:    12.40 cm/s LV Volumes (MOD)               LV E/e' medial:  14.0 LV vol d, MOD    107.0 ml      LV e' lateral:   13.16 cm/s A2C:                           LV E/e' lateral: 13.2 LV vol d, MOD    108.4 ml A4C: LV vol s, MOD    41.8 ml A2C: LV vol s, MOD    50.2 ml A4C: LV SV MOD A2C:   65.2 ml LV SV MOD A4C:   108.4 ml LV SV MOD BP:    63.4 ml RIGHT VENTRICLE RV S prime:     19.10 cm/s TAPSE (M-mode): 3.0 cm LEFT ATRIUM             Index        RIGHT ATRIUM           Index LA diam:        4.10 cm 2.27 cm/m   RA Area:     17.00 cm LA Vol (A2C):   85.7 ml 47.36 ml/m  RA Volume:   50.20 ml  27.74 ml/m LA Vol (A4C):   72.5 ml 40.07 ml/m LA Biplane Vol: 82.3 ml 45.48 ml/m  AORTIC VALVE LVOT Vmax:   88.30 cm/s LVOT Vmean:  61.600 cm/s LVOT VTI:    0.229 m  AORTA Ao Root diam: 3.40 cm Ao Asc diam:  3.20 cm MITRAL VALVE                TRICUSPID VALVE MV Area VTI:  1.12 cm      TR Peak grad:   42.0  mmHg MV Peak grad: 15.8 mmHg     TR Vmax:        324.00 cm/s MV Mean grad: 3.0 mmHg MV Vmax:      1.99 m/s      SHUNTS MV Vmean:     66.3 cm/s     Systemic VTI:  0.23 m MV E velocity: 173.86 cm/s  Systemic Diam: 1.90 cm MV A velocity: 70.27 cm/s MV E/A ratio:  2.47 Franck Azobou Tonleu Electronically signed by Joelle Cedars Tonleu Signature Date/Time: 09/14/2024/3:13:50 PM    Final    DG Chest Portable 1 View Result Date: 09/14/2024 CLINICAL DATA:  Shortness of breath. EXAM: PORTABLE CHEST 1 VIEW COMPARISON:  Chest radiograph dated 06/29/2020. FINDINGS: Bilateral patchy and confluent airspace opacities,  predominantly involving the mid to lower lung field, and right greater than left consistent with pneumonia. Small bilateral pleural effusions. No pneumothorax. The cardiac silhouette is within normal limits. Osteopenia with degenerative changes of the spine. Chronic appearing fracture of the right humeral neck. No acute osseous pathology. IMPRESSION: Bilateral pneumonia. Follow-up to resolution recommended. Electronically Signed   By: Vanetta Chou M.D.   On: 09/14/2024 10:38   CT KNEE LEFT WO CONTRAST Result Date: 09/12/2024 CLINICAL DATA:  Clemens and injured left knee. Persistent pain and swelling. EXAM: CT OF THE LEFT KNEE WITHOUT CONTRAST TECHNIQUE: Multidetector CT imaging of the left knee was performed according to the standard protocol. Multiplanar CT image reconstructions were also generated. RADIATION DOSE REDUCTION: This exam was performed according to the departmental dose-optimization program which includes automated exposure control, adjustment of the mA and/or kV according to patient size and/or use of iterative reconstruction technique. COMPARISON:  None Available. FINDINGS: There is a very large hematoma involving the anterior aspect of the knee wrapping around the patella and patellar tendon. This is likely traumatic hemorrhagic prepatellar bursitis given its location and appearance. It is  all superficial to the patellar tendon and medial retinacular structures. No involvement of the lower quadriceps muscles. The quadriceps and patellar tendons are intact. No acute fracture is identified. Specifically, the patella is intact. Age related tricompartmental degenerative changes most significant in the medial compartment. Grossly by CT the cruciate and collateral ligaments are intact. Age related vascular calcifications.  No joint effusion. IMPRESSION: 1. Very large hematoma involving the anterior aspect of the knee wrapping around the patella and patellar tendon. This is likely traumatic hemorrhagic prepatellar bursitis given its location and appearance. 2. No acute fracture is identified. Specifically, the patella is intact. 3. Age related tricompartmental degenerative changes most significant in the medial compartment. Electronically Signed   By: MYRTIS Stammer M.D.   On: 09/12/2024 09:49     Subjective: - no chest pain, shortness of breath, no abdominal pain, nausea or vomiting.   Discharge Exam: BP (!) 120/47 (BP Location: Right Arm)   Pulse 78   Temp 97.9 F (36.6 C) (Oral)   Resp 20   Ht 5' 6 (1.676 m)   Wt 69.6 kg   SpO2 97%   BMI 24.77 kg/m   General: Pt is alert, awake, not in acute distress Cardiovascular: RRR, S1/S2 +, no rubs, no gallops Respiratory: CTA bilaterally, no wheezing, no rhonchi Abdominal: Soft, NT, ND, bowel sounds + Extremities: no edema, no cyanosis    The results of significant diagnostics from this hospitalization (including imaging, microbiology, ancillary and laboratory) are listed below for reference.     Microbiology: Recent Results (from the past 240 hours)  Gram stain     Status: None   Collection Time: 09/24/24  3:27 PM   Specimen: Lung, Right; Pleural Fluid  Result Value Ref Range Status   Specimen Description PLEURAL  Final   Special Requests LUNG RIGHT  Final   Gram Stain   Final    RARE WBC PRESENT, PREDOMINANTLY  MONONUCLEAR NO ORGANISMS SEEN Performed at [facility], [address], [place], [place] [address]    Report Status 09/24/2024 FINAL  Final  Culture, body fluid w Gram Stain-bottle     Status: None   Collection Time: 09/24/24  3:27 PM   Specimen: Pleura  Result Value Ref Range Status   Specimen Description PLEURAL  Final   Special Requests LUNG RIGHT  Final   Culture  Final    NO GROWTH 5 DAYS Performed at [facility], [address] [place], [place] [address]    Report Status 09/29/2024 FINAL  Final     Labs: Basic Metabolic Panel: Recent Labs  Lab 09/26/24 0235 09/27/24 0214 09/28/24 0210 09/29/24 0141 09/30/24 0152  NA 132* 136 137 136 135  K 3.9 3.0* 3.5 3.1* 4.0  CL 93* 95* 98 96* 97*  CO2 28 29 28 31 30   GLUCOSE 120* 116* 114* 113* 117*  BUN 71* 68* 61* 59* 64*  CREATININE 1.90* 1.88* 1.59* 1.51* 1.66*  CALCIUM  8.7* 8.6* 8.9 8.5* 8.6*  MG 2.1 1.9 1.9 1.8 1.8   Liver Function Tests: Recent Labs  Lab 09/24/24 1059 09/26/24 0235 09/28/24 0210  AST <10* 14* 15  ALT 7 7 11   ALKPHOS 67 66 72  BILITOT 0.8 0.9 0.8  PROT 6.5 6.3* 6.1*  ALBUMIN  3.2* 3.1* 2.9*   CBC: Recent Labs  Lab 09/23/24 1815 09/24/24 1059 09/25/24 0253 09/26/24 0235 09/27/24 0214 09/28/24 0210 09/29/24 0141  WBC 17.8*   < > 19.1* 15.8* 15.4* 11.2* 7.6  NEUTROABS 11.1*  --   --   --   --   --   --   HGB 8.3*   < > 9.2* 8.3* 8.2* 8.2* 8.1*  HCT 26.3*   < > 28.4* 25.4* 25.8* 25.8* 25.8*  MCV 89.2   < > 87.9 85.8 87.8 87.2 88.1  PLT 196   < > 184 190 157 157 150   < > = values in this interval not displayed.   CBG: No results for input(s): GLUCAP in the last 168 hours. Hgb A1c No results for input(s): HGBA1C in the last 72 hours. Lipid Profile No results for input(s): CHOL, HDL, LDLCALC, TRIG, CHOLHDL, LDLDIRECT in the last 72 hours. Thyroid  function studies No results for input(s): TSH, T4TOTAL, T3FREE, THYROIDAB in the last 72  hours.  Invalid input(s): FREET3 Urinalysis    Component Value Date/Time   COLORURINE YELLOW 09/14/2024 1046   APPEARANCEUR TURBID (A) 09/14/2024 1046   LABSPEC 1.020 09/14/2024 1046   PHURINE 7.0 09/14/2024 1046   GLUCOSEU NEGATIVE 09/14/2024 1046   HGBUR TRACE (A) 09/14/2024 1046   BILIRUBINUR NEGATIVE 09/14/2024 1046   KETONESUR NEGATIVE 09/14/2024 1046   PROTEINUR 100 (A) 09/14/2024 1046   NITRITE NEGATIVE 09/14/2024 1046   LEUKOCYTESUR LARGE (A) 09/14/2024 1046    FURTHER DISCHARGE INSTRUCTIONS:   Get Medicines reviewed and adjusted: Please take all your medications with you for your next visit with your Primary MD   Laboratory/radiological data: Please request your Primary MD to go over all hospital tests and procedure/radiological results at the follow up, please ask your Primary MD to get all Hospital records sent to his/her office.   In some cases, they will be blood work, cultures and biopsy results pending at the time of your discharge. Please request that your primary care M.D. goes through all the records of your hospital data and follows up on these results.   Also Note the following: If you experience worsening of your admission symptoms, develop shortness of breath, life threatening emergency, suicidal or homicidal thoughts you must seek medical attention immediately by calling 911 or calling your MD immediately  if symptoms less severe.   You must read complete instructions/literature along with all the possible adverse reactions/side effects for all the Medicines you take and that have been prescribed to you. Take any new Medicines after you have completely  understood and accpet all the possible adverse reactions/side effects.    Do not drive when taking Pain medications or sleeping medications (Benzodaizepines)   Do not take more than prescribed Pain, Sleep and Anxiety Medications. It is not advisable to combine anxiety,sleep and pain medications without  talking with your primary care practitioner   Special Instructions: If you have smoked or chewed Tobacco  in the last 2 yrs please stop smoking, stop any regular Alcohol  and or any Recreational drug use.   Wear Seat belts while driving.   Please note: You were cared for by a hospitalist during your hospital stay. Once you are discharged, your primary care physician will handle any further medical issues. Please note that NO REFILLS for any discharge medications will be authorized once you are discharged, as it is imperative that you return to your primary care physician (or establish a relationship with a primary care physician if you do not have one) for your post hospital discharge needs so that they can reassess your need for medications and monitor your lab values.  Time coordinating discharge: 35 minutes  SIGNED:  Nilda Fendt, MD, PhD 09/30/2024, 1:57 PM   "

## 2024-09-30 NOTE — Progress Notes (Signed)
 Physical Therapy Treatment Patient Details Name: Alyssa Hudson MRN: [id number] DOB: Jul 07, 1941 Today's Date: 09/30/2024   History of Present Illness Pt is an 84 y/o F presenting to ED on 12/20 with SOB, fall with LLE hematoma. CXR with bil PNA. Pt with RR called 12/21 for tachypnea on bipap, intubated 12/21 and transferred to ICU. Admitted for acute hypoxemic respiratory failure 2/2 PNA. Extubated 12/23. PMH includes HTN, A fib, cervical radiculopathy.    PT Comments  Pt resting in bed on arrival, pleasant and eager for mobility and demonstrating steady progress towards acute goals. Pt continues to be limited in safe mobility by decreased activity tolerance, impaired balance/postural reactions, urinary incontinence and global weakness. Pt performing bed mobility, transfers sit<>stand and room level ambulation with RW for support with grossly CGA-min A to maintain balance. Current plan remains appropriate to address deficits and maximize functional independence and decrease caregiver burden. Pt continues to benefit from skilled PT services to progress toward functional mobility goals.     If plan is discharge home, recommend the following: A little help with bathing/dressing/bathroom;Assistance with cooking/housework;Help with stairs or ramp for entrance;A lot of help with walking and/or transfers   Can travel by private vehicle     No  Equipment Recommendations  None recommended by PT    Recommendations for Other Services       Precautions / Restrictions Precautions Precautions: Fall Recall of Precautions/Restrictions: Intact Precaution/Restrictions Comments: watch O2/RR; not on O2 baseline Restrictions Weight Bearing Restrictions Per Provider Order: No     Mobility  Bed Mobility Overal bed mobility: Needs Assistance Bed Mobility: Supine to Sit     Supine to sit: Supervision     General bed mobility comments: supervision for safety, no physical assist needed with HOB  eleavted    Transfers Overall transfer level: Needs assistance Equipment used: Rolling walker (2 wheels) Transfers: Sit to/from Stand Sit to Stand: Contact guard assist           General transfer comment: CGA on rise to stand x3 during session, cues for hand placement on rise from chair    Ambulation/Gait Ambulation/Gait assistance: Min assist Gait Distance (Feet): 45 Feet Assistive device: Rolling walker (2 wheels) Gait Pattern/deviations: Drifts right/left, Trunk flexed, Narrow base of support, Step-through pattern Gait velocity: decreased     General Gait Details: slow gait with flexed trunk, pt with x3 standing rest breaks due to fatigue   Stairs             Wheelchair Mobility     Tilt Bed    Modified Rankin (Stroke Patients Only)       Balance Overall balance assessment: Needs assistance Sitting-balance support: Feet supported, Bilateral upper extremity supported Sitting balance-Leahy Scale: Good Sitting balance - Comments: seated up EOB and at edge of chair   Standing balance support: During functional activity, Reliant on assistive device for balance, Bilateral upper extremity supported Standing balance-Leahy Scale: Poor Standing balance comment: reliant on BUE support in standing                            Communication Communication Communication: Impaired Factors Affecting Communication: Hearing impaired  Cognition Arousal: Alert Behavior During Therapy: WFL for tasks assessed/performed   PT - Cognitive impairments: No apparent impairments                         Following commands: Intact      Cueing  Cueing Techniques: Verbal cues, Gestural cues, Tactile cues  Exercises      General Comments General comments (skin integrity, edema, etc.): Recieved on RA with SpO2 94% on RA at rest,, pleth variable with activity with SpO2 82-91% on RA wtih activity. pt with urinary incontinence with standing      Pertinent  Vitals/Pain Pain Assessment Pain Assessment: No/denies pain    Home Living                          Prior Function            PT Goals (current goals can now be found in the care plan section) Acute Rehab PT Goals Patient Stated Goal: to return home but agreeable to ST rehab if needed PT Goal Formulation: With patient Time For Goal Achievement: 10/07/24 Progress towards PT goals: Progressing toward goals    Frequency    Min 2X/week      PT Plan      Co-evaluation              AM-PAC PT 6 Clicks Mobility   Outcome Measure  Help needed turning from your back to your side while in a flat bed without using bedrails?: A Little Help needed moving from lying on your back to sitting on the side of a flat bed without using bedrails?: A Lot Help needed moving to and from a bed to a chair (including a wheelchair)?: A Little Help needed standing up from a chair using your arms (e.g., wheelchair or bedside chair)?: A Little Help needed to walk in hospital room?: A Little Help needed climbing 3-5 steps with a railing? : Total 6 Click Score: 15    End of Session Equipment Utilized During Treatment: Gait belt Activity Tolerance: Patient limited by fatigue;Patient tolerated treatment well Patient left: with call bell/phone within reach;in chair Nurse Communication: Mobility status PT Visit Diagnosis: Unsteadiness on feet (R26.81);Muscle weakness (generalized) (M62.81)     Time: 8963-8941 PT Time Calculation (min) (ACUTE ONLY): 22 min  Charges:    $Therapeutic Activity: 8-22 mins PT General Charges $$ ACUTE PT VISIT: 1 Visit                     Adelma Bowdoin R. PTA Acute Rehabilitation Services Office: [phone]   Therisa CHRISTELLA Boor 09/30/2024, 12:19 PM

## 2024-09-30 NOTE — Progress Notes (Deleted)
 " PROGRESS NOTE  [name] FMW:[id number] DOB: 1941-06-03 DOA: 09/14/2024 PCP: [name], MD   LOS: 16 days   Brief Narrative / Interim history: 84 year old female with history of aortic stenosis, chronic diastolic CHF, chronic A-fib, CAD, HTN comes into the hospital with cough, shortness of breath.  She was found to have pneumonia, but also had significant troponin elevation and cardiology was consulted.  She underwent a cardiac cath with stent placement to LAD.  Hospital course complicated by flash pulmonary edema having to be intubated, eventually was transferred out of the ICU on 12/25.  She has recurrent flash pulmonary edema on 12/26, and also right-sided large pleural effusion s/p thoracentesis 12/30.  She had recurrent pulmonary edema and shortness of breath overnight 12/30-12/31  Subjective / 24h Interval events: Did not sleep as well last night, was restless.  Denies any shortness of breath, no chest pain  Assesement and Plan: Principal problem CAD, Non-STEMI -cardiology consulted and followed patient while hospitalized.  Underwent a cardiac catheterization and is now status post PCI to DES to LAD.  She also has severe triple-vessel disease.  Currently she is on Eliquis , aspirin  and Plavix , she will need to come off aspirin  on 10/16/2024 at 30 days after her cath - Continue statin, no chest pain  Active problems Acute on chronic diastolic CHF -patient initially diuresed with IV and transition to p.o. Lasix , however had recurrent pulmonary edema and respiratory distress.  She was placed back on IV furosemide  again with improvement in her respiratory status.  - Given failure to p.o. furosemide , discussed with cardiology and will switch to torsemide  with intermittent metolazone  twice a week.  Chest x-ray done on 1/3 shows significant improvement in her aeration and this is correlated clinically with her being on room air - Continue torsemide , volume status is much improved and  has remained stable over the weekend.  Dose metolazone  today, will need another dose on Thursday as she will be dosed twice a week. - TOC consulted she is now stable to go to short-term rehab  New onset A-fib -cardiology following, currently on oral amiodarone , Eliquis .  Telemetry unremarkable  Cardiogenic shock -was in the ICU briefly on pressors.  Now weaned off and stable on the floor.  Hypokalemia-continue to monitor  Acute hypoxic respiratory failure -multifactorial in the setting of pneumonia, non-STEMI, flash pulmonary edema.  She remains on room air  Possible pneumonia -patient completed a course of antibiotics with IV Zosyn .  She did have a rise in her white count on 12/31, improved today.  She is afebrile.  Hold further antibiotics, leukocytosis improving  AKI on CKD 3B-creatinine appears to be 1.4 at baseline.  Creatinine peaked at 2.5, likely due to shock.  Creatinine now stable around 1.8-1.9 with ongoing diuresis, but overall stable and without large fluctuations.  Down to 1.6 today  Acute blood loss anemia -after an injury and a fall, has significant left leg hematoma.  She is also iron  deficient status post IV iron .  She is status post a unit of packed red blood cells on 12/22.  Hemoglobin has remained stable  Scheduled Meds:  amiodarone   200 mg Oral BID   apixaban   2.5 mg Oral BID   aspirin  EC  81 mg Oral Daily   atorvastatin   40 mg Oral Daily   benzonatate   100 mg Oral TID   clopidogrel   75 mg Oral Q breakfast   feeding supplement  237 mL Oral BID BM   guaiFENesin   600 mg  Oral BID   isosorbide  mononitrate  30 mg Oral Daily   losartan   25 mg Oral Daily   melatonin  5 mg Oral QHS   metolazone   2.5 mg Oral Once   pantoprazole   40 mg Oral QHS   sodium chloride  flush  3 mL Intravenous Q12H   torsemide   40 mg Oral Daily   Continuous Infusions: PRN Meds:.acetaminophen , ALPRAZolam , hydrALAZINE , ipratropium-albuterol , labetalol , ondansetron  (ZOFRAN ) IV, mouth rinse, sodium  chloride flush  Current Outpatient Medications  Medication Instructions   amLODipine  (NORVASC ) 10 mg, Oral, Daily   aspirin  EC 81 mg, Oral, Daily, Swallow whole.   aspirin  EC 325 mg, Oral, Daily PRN   Biotin 5,000 mcg, Oral, Daily   CRANBERRY PO 650 mg, Oral, Daily   furosemide  (LASIX ) 40 mg, Oral, Daily, Not taking on therapy days   hydrochlorothiazide  (HYDRODIURIL ) 25 mg, Oral, Daily, Takes only on therapy days.   losartan  (COZAAR ) 100 mg, Oral, Daily   MAGNESIUM  PO 400 mg, Daily   metoprolol  succinate (TOPROL -XL) 50 mg, Oral, Daily   OVER THE COUNTER MEDICATION 4 capsules, Daily   Probiotic Product (PROBIOTIC PO) 1 tablet, Oral, Daily    Diet Orders (From admission, onward)     Start     Ordered   09/20/24 0908  Diet Heart Room service appropriate? Yes; Fluid consistency: Thin  Diet effective now       Question Answer Comment  Room service appropriate? Yes   Fluid consistency: Thin      09/20/24 0908            DVT prophylaxis: apixaban  (ELIQUIS ) tablet 2.5 mg Start: 09/17/24 1615 apixaban  (ELIQUIS ) tablet 2.5 mg   Lab Results  Component Value Date   PLT 150 09/29/2024      Code Status: Do not attempt resuscitation (DNR) PRE-ARREST INTERVENTIONS DESIRED  Family Communication: Nephew at bedside  Status is: Inpatient Remains inpatient appropriate because: Severity of illness   Level of care: Progressive  Consultants:  Cardiology  Objective: Vitals:   09/29/24 2254 09/30/24 0352 09/30/24 0727 09/30/24 1155  BP: (!) 130/53 (!) 155/63 (!) 139/49 (!) 120/47  Pulse: 70 75 74 78  Resp: 20 20 18 20   Temp: 97.7 F (36.5 C) 98.1 F (36.7 C) 98.5 F (36.9 C) 97.9 F (36.6 C)  TempSrc: Axillary Oral Oral Oral  SpO2: 92% 94% 93% 97%  Weight:  69.6 kg    Height:        Intake/Output Summary (Last 24 hours) at 09/30/2024 1255 Last data filed at 09/30/2024 1100 Gross per 24 hour  Intake 1076 ml  Output 350 ml  Net 726 ml   Wt Readings from Last 3  Encounters:  09/30/24 69.6 kg  07/31/24 72.9 kg  07/28/23 74.1 kg    Examination:  Constitutional: NAD Eyes: lids and conjunctivae normal, no scleral icterus ENMT: mmm Neck: normal, supple Respiratory: clear to auscultation bilaterally, no wheezing, no crackles. Normal respiratory effort.  Cardiovascular: Regular rate and rhythm, no murmurs / rubs / gallops. No LE edema. Abdomen: soft, no distention, no tenderness. Bowel sounds positive.   Data Reviewed: I have independently reviewed following labs and imaging studies   CBC Recent Labs  Lab 09/23/24 1815 09/24/24 1059 09/25/24 0253 09/26/24 0235 09/27/24 0214 09/28/24 0210 09/29/24 0141  WBC 17.8*   < > 19.1* 15.8* 15.4* 11.2* 7.6  HGB 8.3*   < > 9.2* 8.3* 8.2* 8.2* 8.1*  HCT 26.3*   < > 28.4* 25.4* 25.8* 25.8* 25.8*  PLT 196   < > 184 190 157 157 150  MCV 89.2   < > 87.9 85.8 87.8 87.2 88.1  MCH 28.1   < > 28.5 28.0 27.9 27.7 27.6  MCHC 31.6   < > 32.4 32.7 31.8 31.8 31.4  RDW 14.8   < > 14.7 14.9 14.9 15.1 14.9  LYMPHSABS 2.2  --   --   --   --   --   --   MONOABS 2.8*  --   --   --   --   --   --   EOSABS 0.0  --   --   --   --   --   --   BASOSABS 0.1  --   --   --   --   --   --    < > = values in this interval not displayed.    Recent Labs  Lab 09/23/24 1815 09/24/24 1059 09/25/24 0253 09/25/24 1342 09/26/24 0235 09/27/24 0214 09/28/24 0210 09/29/24 0141 09/30/24 0152  NA  --  134*   < >  --  132* 136 137 136 135  K  --  4.4   < >  --  3.9 3.0* 3.5 3.1* 4.0  CL  --  95*   < >  --  93* 95* 98 96* 97*  CO2  --  26   < >  --  28 29 28 31 30   GLUCOSE  --  139*   < >  --  120* 116* 114* 113* 117*  BUN  --  54*   < >  --  71* 68* 61* 59* 64*  CREATININE  --  1.87*   < >  --  1.90* 1.88* 1.59* 1.51* 1.66*  CALCIUM   --  8.9   < >  --  8.7* 8.6* 8.9 8.5* 8.6*  AST  --  <10*  --   --  14*  --  15  --   --   ALT  --  7  --   --  7  --  11  --   --   ALKPHOS  --  67  --   --  66  --  72  --   --   BILITOT   --  0.8  --   --  0.9  --  0.8  --   --   ALBUMIN   --  3.2*  --   --  3.1*  --  2.9*  --   --   MG  --   --    < >  --  2.1 1.9 1.9 1.8 1.8  CRP 6.0*  --   --   --   --   --   --   --   --   PROCALCITON 0.25  --   --   --   --   --   --   --   --   LATICACIDVEN  --   --   --  2.1*  --   --   --   --   --    < > = values in this interval not displayed.    ------------------------------------------------------------------------------------------------------------------ No results for input(s): CHOL, HDL, LDLCALC, TRIG, CHOLHDL, LDLDIRECT in the last 72 hours.  Lab Results  Component Value Date   HGBA1C 5.4 11/18/2020   ------------------------------------------------------------------------------------------------------------------ No results for input(s): TSH, T4TOTAL, T3FREE, THYROIDAB in the last 72 hours.  Invalid  input(s): FREET3  Cardiac Enzymes No results for input(s): CKMB, TROPONINI, MYOGLOBIN in the last 168 hours.  Invalid input(s): CK  ------------------------------------------------------------------------------------------------------------------    Component Value Date/Time   BNP 165.4 (H) 06/29/2020 1348    CBG: No results for input(s): GLUCAP in the last 168 hours.   Recent Results (from the past 240 hours)  Gram stain     Status: None   Collection Time: 09/24/24  3:27 PM   Specimen: Lung, Right; Pleural Fluid  Result Value Ref Range Status   Specimen Description PLEURAL  Final   Special Requests LUNG RIGHT  Final   Gram Stain   Final    RARE WBC PRESENT, PREDOMINANTLY MONONUCLEAR NO ORGANISMS SEEN Performed at [facility], [address], [place], [place] [address]    Report Status 09/24/2024 FINAL  Final  Culture, body fluid w Gram Stain-bottle     Status: None   Collection Time: 09/24/24  3:27 PM   Specimen: Pleura  Result Value Ref Range Status   Specimen Description PLEURAL  Final   Special Requests LUNG  RIGHT  Final   Culture   Final    NO GROWTH 5 DAYS Performed at [facility], [address] [place], [place] [address]    Report Status 09/29/2024 FINAL  Final     Radiology Studies: No results found.     Nilda Fendt, MD, PhD [facility]  Between 7 am - 7 pm I am available, please contact me via Amion (for emergencies) or Securechat (non urgent messages)  Between 7 pm - 7 am I am not available, please contact night coverage MD/APP via Amion  "

## 2024-09-30 NOTE — Progress Notes (Addendum)
 Patient provided with verbal discharge instructions. Paper copy of discharge provided to patient. RN answered all questions. VSS at discharge. IV removed. Patient belongings sent with patient son. Patient dc'd via d/c lounge awaiting PTAR. Report called to [facility] [phone] report given to Kayla, nurse on floor.

## 2024-10-03 ENCOUNTER — Ambulatory Visit: Attending: Cardiology | Admitting: Cardiology

## 2024-10-03 ENCOUNTER — Encounter: Payer: Self-pay | Admitting: Cardiology

## 2024-10-03 VITALS — BP 100/40 | HR 79 | Ht 65.0 in | Wt 146.4 lb

## 2024-10-03 DIAGNOSIS — Z955 Presence of coronary angioplasty implant and graft: Secondary | ICD-10-CM

## 2024-10-03 DIAGNOSIS — Z79899 Other long term (current) drug therapy: Secondary | ICD-10-CM | POA: Diagnosis not present

## 2024-10-03 DIAGNOSIS — I2510 Atherosclerotic heart disease of native coronary artery without angina pectoris: Secondary | ICD-10-CM

## 2024-10-03 DIAGNOSIS — E782 Mixed hyperlipidemia: Secondary | ICD-10-CM | POA: Diagnosis not present

## 2024-10-03 DIAGNOSIS — I4891 Unspecified atrial fibrillation: Secondary | ICD-10-CM

## 2024-10-03 DIAGNOSIS — I10 Essential (primary) hypertension: Secondary | ICD-10-CM | POA: Diagnosis not present

## 2024-10-03 NOTE — Patient Instructions (Signed)
 Medication Instructions:  Your physician has recommended you make the following change in your medication:   Stop Isosorbide /Imdur   *If you need a refill on your cardiac medications before your next appointment, please call your pharmacy*   Lab Work: None ordered If you have labs (blood work) drawn today and your tests are completely normal, you will receive your results only by: MyChart Message (if you have MyChart) OR A paper copy in the mail If you have any lab test that is abnormal or we need to change your treatment, we will call you to review the results.   Testing/Procedures: None ordered   Follow-Up: At [facility], you and your health needs are our priority.  As part of our continuing mission to provide you with exceptional heart care, we have created designated Provider Care Teams.  These Care Teams include your primary Cardiologist (physician) and Advanced Practice Providers (APPs -  Physician Assistants and Nurse Practitioners) who all work together to provide you with the care you need, when you need it.  We recommend signing up for the patient portal called MyChart.  Sign up information is provided on this After Visit Summary.  MyChart is used to connect with patients for Virtual Visits (Telemedicine).  Patients are able to view lab/test results, encounter notes, upcoming appointments, etc.  Non-urgent messages can be sent to your provider as well.   To learn more about what you can do with MyChart, go to [email or web address].    Your next appointment:   6 week(s)  The format for your next appointment:   In Person  Provider:   Jennifer Crape, MD    Other Instructions none  Important Information About Sugar

## 2024-10-03 NOTE — Progress Notes (Signed)
 " Cardiology Office Note:    Date:  10/03/2024   ID:  Alyssa Hudson, DOB 1941-07-22, MRN [id number]  PCP:  [name], MD  Cardiologist:  Jennifer JONELLE Crape, MD   Referring MD: [name], MD    ASSESSMENT:    1. Primary hypertension   2. Atherosclerosis of native coronary artery of native heart without angina pectoris   3. Status post coronary artery stent placement   4. Atrial fibrillation, unspecified type (HCC)   5. Mixed dyslipidemia   6. On amiodarone  therapy    PLAN:    In order of problems listed above:  Coronary artery disease post stenting: Secondary prevention stressed with the patient.  Importance of compliance with diet medication stressed and patient verbalized standing.  She was advised to ambulate to the best of her ability.  She is chest pain free.  Because of borderline blood pressure I have discontinued isosorbide  Paroxysmal atrial fibrillation:I discussed with the patient atrial fibrillation, disease process. Management and therapy including rate and rhythm control, anticoagulation benefits and potential risks were discussed extensively with the patient. Patient had multiple questions which were answered to patient's satisfaction. Amiodarone  therapy: She is on a taper.  She is going to have blood work tomorrow and then send us  a copy.  She will need to be monitored for LFTs and thyroid  testing.  This will be done by primary care at the nursing home. Mixed dyslipidemia: On lipid-lowering medications.  Goal LDL less than 60. Patient will be seen in follow-up appointment in 2 months or earlier if the patient has any concerns.    Medication Adjustments/Labs and Tests Ordered: Current medicines are reviewed at length with the patient today.  Concerns regarding medicines are outlined above.  Orders Placed This Encounter  Procedures   EKG 12-Lead   No orders of the defined types were placed in this encounter.    No chief complaint on file.     History of Present Illness:    Alyssa Hudson is a 84 y.o. female.  Patient has past medical history of recent coronary stenting, essential hypertension, mixed dyslipidemia, paroxysmal atrial fibrillation.  Posthospital she is in a nursing home.  She denies any chest pain orthopnea or PND.  She feels much better after coronary stenting.  At the time of my evaluation, the patient is alert awake oriented and in no distress.  Past Medical History:  Diagnosis Date   Acute diastolic heart failure (HCC) 06/30/2020   Acute pericarditis 07/24/2020   Acute respiratory failure with hypoxia (HCC) 09/20/2024   Aftercare following surgery 06/09/2016   AKI (acute kidney injury) 09/14/2024   Aortic valve sclerosis 2021   Moderate per echo in 2021   Atherosclerosis of native coronary artery of native heart without angina pectoris 09/20/2024   Atrial fibrillation (HCC) 07/24/2020   BMI 33.0-33.9,adult    Carpal tunnel syndrome, right 08/13/2020   Cervical radiculopathy 08/13/2020   Cervical radiculopathy due to osteoarthritis of spine    Chest pain 09/14/2024   CHF exacerbation (HCC) 09/15/2024   Chronic health problem 09/14/2024   Diverticulosis    Encephalopathy 06/19/2020   Facial droop    Generalized osteoarthritis of multiple sites    Hypertension    Hyponatremia 06/20/2020   Non-ST elevation (NSTEMI) myocardial infarction [facility]) 09/15/2024   Pneumonia 09/14/2024   Primary hypertension 07/24/2020   Respiratory distress 09/14/2024   Sepsis (HCC) 09/14/2024   Status post coronary artery stent placement 09/20/2024   Tricompartment osteoarthritis of right knee  Ulnar neuropathy at elbow, right 08/13/2020   Viral pericarditis with pericardial effusion 06/30/2020    Past Surgical History:  Procedure Laterality Date   CARPAL TUNNEL RELEASE Right 10/26/2020   CATARACT EXTRACTION  2003   insert prosthetic lens   CHOLECYSTECTOMY  01/1997   CORONARY STENT INTERVENTION N/A 09/15/2024    Procedure: CORONARY STENT INTERVENTION;  Surgeon: Wonda Sharper, MD;  Location[facility] INVASIVE CV LAB;  Service: Cardiovascular;  Laterality: N/A;   IR FLUORO GUIDED NEEDLE PLC ASPIRATION/INJECTION LOC  06/23/2020   IR THORACENTESIS RIGHT ASP PLEURAL SPACE W/IMG GUIDE  09/24/2024   LEFT HEART CATH AND CORONARY ANGIOGRAPHY N/A 09/15/2024   Procedure: LEFT HEART CATH AND CORONARY ANGIOGRAPHY;  Surgeon: Wonda Sharper, MD;  Location: [facility] INVASIVE CV LAB;  Service: Cardiovascular;  Laterality: N/A;   RADIOLOGY WITH ANESTHESIA N/A 06/23/2020   Procedure: IR WITH ANESTHESIA;  Surgeon: Radiologist, Medication, MD;  Location: [facility] OR;  Service: Radiology;  Laterality: N/A;   TOTAL HYSTERECTOMY  02/1997    Current Medications: Active Medications[1]   Allergies:   Codeine, Contrast media [iodinated contrast media], Iodine-131, and Sulfa antibiotics   Social History   Socioeconomic History   Marital status: Married    Spouse name: Not on file   Number of children: 2   Years of education: Not on file   Highest education level: High school graduate  Occupational History   Not on file  Tobacco Use   Smoking status: Never   Smokeless tobacco: Never  Substance and Sexual Activity   Alcohol use: Never   Drug use: Never   Sexual activity: Not on file  Other Topics Concern   Not on file  Social History Narrative   Lives at home with husband   Caffeine: none    Social Drivers of Health   Tobacco Use: Low Risk (10/03/2024)   Patient History    Smoking Tobacco Use: Never    Smokeless Tobacco Use: Never    Passive Exposure: Not on file  Financial Resource Strain: Not on file  Food Insecurity: No Food Insecurity (09/19/2024)   Epic    Worried About Programme Researcher, Broadcasting/film/video in the Last Year: Never true    Ran Out of Food in the Last Year: Never true  Transportation Needs: No Transportation Needs (09/19/2024)   Epic    Lack of Transportation (Medical): No    Lack of Transportation (Non-Medical): No   Physical Activity: Not on file  Stress: Not on file  Social Connections: Socially Integrated (09/19/2024)   Social Connection and Isolation Panel    Frequency of Communication with Friends and Family: Three times a week    Frequency of Social Gatherings with Friends and Family: Three times a week    Attends Religious Services: More than 4 times per year    Active Member of Clubs or Organizations: No    Attends Banker Meetings: More than 4 times per year    Marital Status: Married  Depression (EYV7-0): Not on file  Alcohol Screen: Not on file  Housing: Low Risk (09/19/2024)   Epic    Unable to Pay for Housing in the Last Year: No    Number of Times Moved in the Last Year: 0    Homeless in the Last Year: No  Utilities: Not At Risk (09/19/2024)   Epic    Threatened with loss of utilities: No  Health Literacy: Not on file     Family History: The patient's family history includes Alcohol  abuse in her brother; Alzheimer's disease (age of onset: 56) in her sister; CVA in her mother; Hypertension in her father and mother; Muscular dystrophy in her brother; Prostate cancer (age of onset: 14) in her father; Stroke in her brother.  ROS:   Please see the history of present illness.    All other systems reviewed and are negative.  EKGs/Labs/Other Studies Reviewed:    The following studies were reviewed today: .SABRAEKG Interpretation Date/Time:  Thursday October 03 2024 16:18:42 EST Ventricular Rate:  79 PR Interval:  254 QRS Duration:  94 QT Interval:  474 QTC Calculation: 543 R Axis:   -62  Text Interpretation: Sinus rhythm with marked sinus arrhythmia with 1st degree A-V block Left anterior fascicular block Septal infarct , age undetermined ST & T wave abnormality, consider inferolateral ischemia Prolonged QT Abnormal ECG When compared with ECG of 26-Sep-2024 21:27, PR interval has increased Left anterior fascicular block is now Present T wave inversion less evident in  Anterior leads Confirmed by Edwyna Backers [phone]) on 10/03/2024 4:32:00 PM     Recent Labs: 09/14/2024: TSH 3.910 09/23/2024: Pro Brain Natriuretic Peptide >35,000.0 09/28/2024: ALT 11 09/29/2024: Hemoglobin 8.1; Platelets 150 09/30/2024: BUN 64; Creatinine, Ser 1.66; Magnesium  1.8; Potassium 4.0; Sodium 135  Recent Lipid Panel    Component Value Date/Time   CHOL 128 09/18/2024 0456   TRIG 204 (H) 09/18/2024 0456   HDL 36 (L) 09/18/2024 0456   CHOLHDL 3.6 09/18/2024 0456   VLDL 41 (H) 09/18/2024 0456   LDLCALC 51 09/18/2024 0456    Physical Exam:    VS:  BP (!) 100/40   Pulse 79   Ht 5' 5 (1.651 m)   Wt 146 lb 6.4 oz (66.4 kg)   SpO2 97%   BMI 24.36 kg/m     Wt Readings from Last 3 Encounters:  10/03/24 146 lb 6.4 oz (66.4 kg)  09/30/24 153 lb 7 oz (69.6 kg)  07/31/24 160 lb 12.8 oz (72.9 kg)     GEN: Patient is in no acute distress HEENT: Normal NECK: No JVD; No carotid bruits LYMPHATICS: No lymphadenopathy CARDIAC: Hear sounds regular, 2/6 systolic murmur at the apex. RESPIRATORY:  Clear to auscultation without rales, wheezing or rhonchi  ABDOMEN: Soft, non-tender, non-distended MUSCULOSKELETAL:  No edema; No deformity  SKIN: Warm and dry NEUROLOGIC:  Alert and oriented x 3 PSYCHIATRIC:  Normal affect   Signed, Backers JONELLE Edwyna, MD  10/03/2024 4:43 PM    [facility]     [1]  Current Meds  Medication Sig   ALPRAZolam  (XANAX ) 0.25 MG tablet Take 1 tablet (0.25 mg total) by mouth 3 (three) times daily as needed for anxiety.   amiodarone  (PACERONE ) 200 MG tablet Take 1 tablet (200 mg total) by mouth 2 (two) times daily for 10 days, THEN 1 tablet (200 mg total) daily.   apixaban  (ELIQUIS ) 2.5 MG TABS tablet Take 1 tablet (2.5 mg total) by mouth 2 (two) times daily.   aspirin  EC 81 MG tablet Take 81 mg by mouth daily. Swallow whole.   atorvastatin  (LIPITOR) 40 MG tablet Take 1 tablet (40 mg total) by mouth daily.   Biotin 5000 MCG TABS Take  5,000 mcg by mouth daily.   clopidogrel  (PLAVIX ) 75 MG tablet Take 1 tablet (75 mg total) by mouth daily with breakfast.   CRANBERRY PO Take 650 mg by mouth daily.   losartan  (COZAAR ) 25 MG tablet Take 1 tablet (25 mg total) by mouth daily.  MAGNESIUM  PO Take 400 mg by mouth daily.   melatonin 5 MG TABS Take 1 tablet (5 mg total) by mouth at bedtime.   metolazone  (ZAROXOLYN ) 2.5 MG tablet Take 1 tablet (2.5 mg total) by mouth 2 (two) times a week.   OVER THE COUNTER MEDICATION Take 4 capsules by mouth daily. MACULAR PROTECT   pantoprazole  (PROTONIX ) 40 MG tablet Take 1 tablet (40 mg total) by mouth at bedtime.   Probiotic Product (PROBIOTIC PO) Take 1 tablet by mouth daily.   torsemide  40 MG TABS Take 40 mg by mouth daily.   [DISCONTINUED] isosorbide  mononitrate (IMDUR ) 30 MG 24 hr tablet Take 1 tablet (30 mg total) by mouth daily.   "

## 2024-10-27 DEATH — deceased

## 2024-11-14 ENCOUNTER — Ambulatory Visit: Admitting: Cardiology
